# Patient Record
Sex: Male | Born: 1945
Health system: Southern US, Community
[De-identification: ages and names within clinical notes are randomized; demographics above are authoritative.]

## PROBLEM LIST (undated history)

## (undated) DIAGNOSIS — I2510 Atherosclerotic heart disease of native coronary artery without angina pectoris: Secondary | ICD-10-CM

## (undated) DIAGNOSIS — I10 Essential (primary) hypertension: Secondary | ICD-10-CM

## (undated) DIAGNOSIS — R569 Unspecified convulsions: Secondary | ICD-10-CM

## (undated) DIAGNOSIS — E785 Hyperlipidemia, unspecified: Secondary | ICD-10-CM

## (undated) DIAGNOSIS — G40909 Epilepsy, unspecified, not intractable, without status epilepticus: Secondary | ICD-10-CM

## (undated) DIAGNOSIS — Z9289 Personal history of other medical treatment: Secondary | ICD-10-CM

## (undated) DIAGNOSIS — M1990 Unspecified osteoarthritis, unspecified site: Secondary | ICD-10-CM

## (undated) DIAGNOSIS — Z9861 Coronary angioplasty status: Principal | ICD-10-CM

## (undated) DIAGNOSIS — E669 Obesity, unspecified: Secondary | ICD-10-CM

## (undated) DIAGNOSIS — R0600 Dyspnea, unspecified: Secondary | ICD-10-CM

## (undated) HISTORY — DX: Epilepsy, unspecified, not intractable, without status epilepticus: G40.909

## (undated) HISTORY — DX: Obesity, unspecified: E66.9

## (undated) HISTORY — DX: Atherosclerotic heart disease of native coronary artery without angina pectoris: I25.10

## (undated) HISTORY — DX: Coronary angioplasty status: Z98.61

## (undated) HISTORY — DX: Unspecified convulsions: R56.9

## (undated) HISTORY — DX: Essential (primary) hypertension: I10

## (undated) HISTORY — DX: Hyperlipidemia, unspecified: E78.5

## (undated) HISTORY — DX: Personal history of other medical treatment: Z92.89

---

## 1997-01-25 DIAGNOSIS — I2510 Atherosclerotic heart disease of native coronary artery without angina pectoris: Secondary | ICD-10-CM

## 1997-01-25 HISTORY — DX: Atherosclerotic heart disease of native coronary artery without angina pectoris: I25.10

## 1998-01-13 ENCOUNTER — Encounter: Payer: Self-pay | Admitting: Emergency Medicine

## 1998-01-13 ENCOUNTER — Emergency Department (HOSPITAL_COMMUNITY): Admission: EM | Admit: 1998-01-13 | Discharge: 1998-01-13 | Payer: Self-pay | Admitting: Emergency Medicine

## 1998-01-15 ENCOUNTER — Observation Stay (HOSPITAL_COMMUNITY): Admission: AD | Admit: 1998-01-15 | Discharge: 1998-01-16 | Payer: Self-pay | Admitting: Cardiology

## 1998-01-20 HISTORY — PX: CORONARY ANGIOPLASTY: SHX604

## 1998-01-20 HISTORY — PX: CARDIAC CATHETERIZATION: SHX172

## 1998-08-11 ENCOUNTER — Ambulatory Visit (HOSPITAL_COMMUNITY): Admission: RE | Admit: 1998-08-11 | Discharge: 1998-08-11 | Payer: Self-pay | Admitting: Cardiology

## 1998-08-12 ENCOUNTER — Encounter: Payer: Self-pay | Admitting: Cardiology

## 2000-07-12 ENCOUNTER — Encounter: Payer: Self-pay | Admitting: Internal Medicine

## 2004-02-06 ENCOUNTER — Ambulatory Visit: Payer: Self-pay | Admitting: Internal Medicine

## 2004-08-19 ENCOUNTER — Ambulatory Visit: Payer: Self-pay | Admitting: Internal Medicine

## 2004-09-04 ENCOUNTER — Ambulatory Visit: Payer: Self-pay | Admitting: Gastroenterology

## 2004-09-16 ENCOUNTER — Encounter (INDEPENDENT_AMBULATORY_CARE_PROVIDER_SITE_OTHER): Payer: Self-pay | Admitting: *Deleted

## 2004-09-16 ENCOUNTER — Ambulatory Visit: Payer: Self-pay | Admitting: Gastroenterology

## 2004-09-21 ENCOUNTER — Ambulatory Visit: Payer: Self-pay | Admitting: Internal Medicine

## 2004-10-14 ENCOUNTER — Ambulatory Visit: Payer: Self-pay | Admitting: Internal Medicine

## 2004-10-27 ENCOUNTER — Ambulatory Visit: Payer: Self-pay | Admitting: Gastroenterology

## 2005-01-05 ENCOUNTER — Ambulatory Visit: Payer: Self-pay | Admitting: Gastroenterology

## 2005-02-18 ENCOUNTER — Ambulatory Visit: Payer: Self-pay | Admitting: Internal Medicine

## 2005-07-29 ENCOUNTER — Ambulatory Visit: Payer: Self-pay | Admitting: Family Medicine

## 2005-10-14 ENCOUNTER — Ambulatory Visit: Payer: Self-pay | Admitting: Internal Medicine

## 2006-05-04 ENCOUNTER — Ambulatory Visit: Payer: Self-pay | Admitting: Internal Medicine

## 2006-05-04 LAB — CONVERTED CEMR LAB
ALT: 28 units/L (ref 0–40)
Albumin: 3.8 g/dL (ref 3.5–5.2)
BUN: 23 mg/dL (ref 6–23)
CO2: 31 meq/L (ref 19–32)
Calcium: 9.8 mg/dL (ref 8.4–10.5)
Chloride: 107 meq/L (ref 96–112)
Cholesterol: 184 mg/dL (ref 0–200)
Creatinine, Ser: 1 mg/dL (ref 0.4–1.5)
GFR calc Af Amer: 98 mL/min
GFR calc non Af Amer: 81 mL/min
Glucose, Bld: 92 mg/dL (ref 70–99)
HDL: 43 mg/dL (ref >39.0)
LDL Cholesterol: 114 mg/dL — ABNORMAL HIGH (ref 0–99)
Phosphorus: 4.1 mg/dL (ref 2.3–4.6)
Potassium: 4.6 meq/L (ref 3.5–5.1)
Sodium: 141 meq/L (ref 135–145)
Total CHOL/HDL Ratio: 4.3
Triglycerides: 137 mg/dL (ref 0–149)
VLDL: 27 mg/dL (ref 0–40)

## 2006-07-05 ENCOUNTER — Encounter: Payer: Self-pay | Admitting: Internal Medicine

## 2006-10-13 DIAGNOSIS — G40909 Epilepsy, unspecified, not intractable, without status epilepticus: Secondary | ICD-10-CM | POA: Insufficient documentation

## 2006-10-13 DIAGNOSIS — R569 Unspecified convulsions: Secondary | ICD-10-CM

## 2006-12-02 ENCOUNTER — Telehealth (INDEPENDENT_AMBULATORY_CARE_PROVIDER_SITE_OTHER): Payer: Self-pay | Admitting: *Deleted

## 2007-01-28 ENCOUNTER — Telehealth: Payer: Self-pay | Admitting: Family Medicine

## 2007-03-06 ENCOUNTER — Ambulatory Visit: Payer: Self-pay | Admitting: Family Medicine

## 2007-03-14 ENCOUNTER — Telehealth: Payer: Self-pay | Admitting: Family Medicine

## 2008-01-01 ENCOUNTER — Telehealth: Payer: Self-pay | Admitting: Internal Medicine

## 2008-03-07 ENCOUNTER — Ambulatory Visit: Payer: Self-pay | Admitting: Internal Medicine

## 2008-03-08 LAB — CONVERTED CEMR LAB
ALT: 23 units/L (ref 0–53)
AST: 25 units/L (ref 0–37)
Albumin: 4.5 g/dL (ref 3.5–5.2)
Albumin: 4.7 g/dL (ref 3.5–5.2)
Alkaline Phosphatase: 63 units/L (ref 39–117)
BUN: 38 mg/dL — ABNORMAL HIGH (ref 6–23)
Basophils Absolute: 0 10*3/uL (ref 0.0–0.1)
Basophils Relative: 0 % (ref 0–1)
Bilirubin, Direct: <0.1 mg/dL (ref 0.0–0.3)
CO2: 19 meq/L (ref 19–32)
Calcium: 9.6 mg/dL (ref 8.4–10.5)
Chloride: 105 meq/L (ref 96–112)
Cholesterol: 190 mg/dL (ref 0–200)
Creatinine, Ser: 1.14 mg/dL (ref 0.40–1.50)
Eosinophils Absolute: 0.1 10*3/uL (ref 0.0–0.7)
Eosinophils Relative: 2 % (ref 0–5)
Glucose, Bld: 95 mg/dL (ref 70–99)
HCT: 39.6 % (ref 39.0–52.0)
HDL: 49 mg/dL (ref >39)
Hemoglobin: 13.2 g/dL (ref 13.0–17.0)
LDL Cholesterol: 117 mg/dL — ABNORMAL HIGH (ref 0–99)
Lymphocytes Relative: 23 % (ref 12–46)
Lymphs Abs: 0.9 10*3/uL (ref 0.7–4.0)
MCHC: 33.3 g/dL (ref 30.0–36.0)
MCV: 89.6 fL (ref 78.0–100.0)
Monocytes Absolute: 0.4 10*3/uL (ref 0.1–1.0)
Monocytes Relative: 9 % (ref 3–12)
Neutro Abs: 2.7 10*3/uL (ref 1.7–7.7)
Neutrophils Relative %: 67 % (ref 43–77)
PSA: 0.94 ng/mL (ref 0.10–4.00)
Phosphorus: 3.6 mg/dL (ref 2.3–4.6)
Platelets: 198 10*3/uL (ref 150–400)
Potassium: 4.7 meq/L (ref 3.5–5.3)
RBC: 4.42 M/uL (ref 4.22–5.81)
RDW: 12.6 % (ref 11.5–15.5)
Sodium: 139 meq/L (ref 135–145)
TSH: 1.147 microintl units/mL (ref 0.350–4.50)
Total Bilirubin: 0.3 mg/dL (ref 0.3–1.2)
Total CHOL/HDL Ratio: 3.9
Total Protein: 7.2 g/dL (ref 6.0–8.3)
Triglycerides: 122 mg/dL (ref <150)
VLDL: 24 mg/dL (ref 0–40)
Valproic Acid Lvl: 28.6 ug/mL — ABNORMAL LOW (ref 50.0–100.0)
WBC: 4.1 10*3/uL (ref 4.0–10.5)

## 2008-05-10 ENCOUNTER — Telehealth: Payer: Self-pay | Admitting: Internal Medicine

## 2008-08-09 ENCOUNTER — Encounter: Payer: Self-pay | Admitting: Internal Medicine

## 2008-10-25 ENCOUNTER — Encounter: Payer: Self-pay | Admitting: Internal Medicine

## 2008-12-05 ENCOUNTER — Encounter: Payer: Self-pay | Admitting: Internal Medicine

## 2009-05-15 ENCOUNTER — Telehealth: Payer: Self-pay | Admitting: Internal Medicine

## 2009-06-04 ENCOUNTER — Encounter: Payer: Self-pay | Admitting: Family Medicine

## 2009-06-06 ENCOUNTER — Telehealth: Payer: Self-pay | Admitting: Internal Medicine

## 2009-07-01 ENCOUNTER — Telehealth (INDEPENDENT_AMBULATORY_CARE_PROVIDER_SITE_OTHER): Payer: Self-pay | Admitting: *Deleted

## 2009-07-01 ENCOUNTER — Ambulatory Visit: Payer: Self-pay | Admitting: Family Medicine

## 2009-07-01 LAB — CONVERTED CEMR LAB
ALT: 20 units/L (ref 0–53)
AST: 19 units/L (ref 0–37)
Albumin: 3.9 g/dL (ref 3.5–5.2)
Alkaline Phosphatase: 56 units/L (ref 39–117)
BUN: 29 mg/dL — ABNORMAL HIGH (ref 6–23)
Bilirubin, Direct: 0.1 mg/dL (ref 0.0–0.3)
CO2: 28 meq/L (ref 19–32)
Calcium: 9.4 mg/dL (ref 8.4–10.5)
Chloride: 109 meq/L (ref 96–112)
Cholesterol: 186 mg/dL (ref 0–200)
Creatinine, Ser: 1.1 mg/dL (ref 0.4–1.5)
GFR calc non Af Amer: 74.29 mL/min (ref >60)
Glucose, Bld: 100 mg/dL — ABNORMAL HIGH (ref 70–99)
HDL: 44.8 mg/dL (ref >39.00)
LDL Cholesterol: 112 mg/dL — ABNORMAL HIGH (ref 0–99)
PSA: 0.36 ng/mL (ref 0.10–4.00)
Potassium: 5.3 meq/L — ABNORMAL HIGH (ref 3.5–5.1)
Sodium: 142 meq/L (ref 135–145)
Total Bilirubin: 0.6 mg/dL (ref 0.3–1.2)
Total CHOL/HDL Ratio: 4
Total Protein: 6.2 g/dL (ref 6.0–8.3)
Triglycerides: 144 mg/dL (ref 0.0–149.0)
VLDL: 28.8 mg/dL (ref 0.0–40.0)

## 2009-07-07 ENCOUNTER — Ambulatory Visit: Payer: Self-pay | Admitting: Family Medicine

## 2009-07-07 DIAGNOSIS — R1011 Right upper quadrant pain: Secondary | ICD-10-CM | POA: Insufficient documentation

## 2009-07-07 DIAGNOSIS — N509 Disorder of male genital organs, unspecified: Secondary | ICD-10-CM | POA: Insufficient documentation

## 2009-07-07 DIAGNOSIS — K5900 Constipation, unspecified: Secondary | ICD-10-CM | POA: Insufficient documentation

## 2009-07-07 DIAGNOSIS — R1032 Left lower quadrant pain: Secondary | ICD-10-CM | POA: Insufficient documentation

## 2009-07-07 LAB — CONVERTED CEMR LAB
Bilirubin Urine: NEGATIVE
Blood in Urine, dipstick: NEGATIVE
Cholesterol, target level: 200 mg/dL
Glucose, Urine, Semiquant: NEGATIVE
HDL goal, serum: 40 mg/dL
Ketones, urine, test strip: NEGATIVE
LDL Goal: 100 mg/dL
Nitrite: NEGATIVE
Protein, U semiquant: NEGATIVE
Specific Gravity, Urine: >=1.03
Urobilinogen, UA: 0.2
WBC Urine, dipstick: NEGATIVE
pH: 6

## 2009-07-08 LAB — CONVERTED CEMR LAB
BUN: 34 mg/dL — ABNORMAL HIGH (ref 6–23)
Basophils Absolute: 0 10*3/uL (ref 0.0–0.1)
Basophils Relative: 1 % (ref 0–1)
CO2: 20 meq/L (ref 19–32)
Calcium: 9.8 mg/dL (ref 8.4–10.5)
Chloride: 106 meq/L (ref 96–112)
Creatinine, Ser: 1.23 mg/dL (ref 0.40–1.50)
Eosinophils Absolute: 0.1 10*3/uL (ref 0.0–0.7)
Eosinophils Relative: 1 % (ref 0–5)
Glucose, Bld: 93 mg/dL (ref 70–99)
HCT: 37.2 % — ABNORMAL LOW (ref 39.0–52.0)
Hemoglobin: 12 g/dL — ABNORMAL LOW (ref 13.0–17.0)
Lymphocytes Relative: 38 % (ref 12–46)
Lymphs Abs: 2.2 10*3/uL (ref 0.7–4.0)
MCHC: 32.3 g/dL (ref 30.0–36.0)
MCV: 93.2 fL (ref 78.0–100.0)
Monocytes Absolute: 0.5 10*3/uL (ref 0.1–1.0)
Monocytes Relative: 8 % (ref 3–12)
Neutro Abs: 3.1 10*3/uL (ref 1.7–7.7)
Neutrophils Relative %: 53 % (ref 43–77)
Platelets: 204 10*3/uL (ref 150–400)
Potassium: 5.2 meq/L (ref 3.5–5.3)
RBC: 3.99 M/uL — ABNORMAL LOW (ref 4.22–5.81)
RDW: 13.6 % (ref 11.5–15.5)
Sodium: 141 meq/L (ref 135–145)
TSH: 1.727 microintl units/mL (ref 0.350–4.500)
Vit D, 25-Hydroxy: 51 ng/mL (ref 30–89)
Vitamin B-12: 536 pg/mL (ref 211–911)
WBC: 5.8 10*3/uL (ref 4.0–10.5)

## 2009-07-23 ENCOUNTER — Ambulatory Visit: Payer: Self-pay | Admitting: Family Medicine

## 2009-07-23 LAB — CONVERTED CEMR LAB: OCCULT 1: NEGATIVE

## 2009-07-29 ENCOUNTER — Ambulatory Visit: Payer: Self-pay | Admitting: Family Medicine

## 2009-07-29 DIAGNOSIS — D638 Anemia in other chronic diseases classified elsewhere: Secondary | ICD-10-CM | POA: Insufficient documentation

## 2009-07-30 DIAGNOSIS — E291 Testicular hypofunction: Secondary | ICD-10-CM

## 2009-07-30 DIAGNOSIS — R7989 Other specified abnormal findings of blood chemistry: Secondary | ICD-10-CM | POA: Insufficient documentation

## 2009-07-30 LAB — CONVERTED CEMR LAB
Ferritin: 203.8 ng/mL (ref 22.0–322.0)
Iron: 78 ug/dL (ref 42–165)

## 2009-07-31 LAB — CONVERTED CEMR LAB
Sex Hormone Binding: 49 nmol/L (ref 13–71)
Testosterone Free: 33.7 pg/mL — ABNORMAL LOW (ref 47.0–244.0)
Testosterone-% Free: 1.5 % — ABNORMAL LOW (ref 1.6–2.9)
Testosterone: 227.24 ng/dL — ABNORMAL LOW (ref 350–890)

## 2009-08-01 ENCOUNTER — Ambulatory Visit: Payer: Self-pay | Admitting: Family Medicine

## 2009-08-05 LAB — CONVERTED CEMR LAB
FSH: 3.5 milliintl units/mL (ref 1.4–18.1)
Prolactin: 5.8 ng/mL (ref 2.1–17.1)

## 2009-08-06 ENCOUNTER — Ambulatory Visit: Payer: Self-pay | Admitting: Cardiology

## 2009-08-12 ENCOUNTER — Telehealth: Payer: Self-pay | Admitting: Family Medicine

## 2009-08-25 ENCOUNTER — Telehealth (INDEPENDENT_AMBULATORY_CARE_PROVIDER_SITE_OTHER): Payer: Self-pay | Admitting: *Deleted

## 2009-09-08 ENCOUNTER — Encounter: Payer: Self-pay | Admitting: Family Medicine

## 2009-09-12 ENCOUNTER — Telehealth: Payer: Self-pay | Admitting: Family Medicine

## 2009-10-21 ENCOUNTER — Ambulatory Visit: Payer: Self-pay | Admitting: Family Medicine

## 2009-12-26 ENCOUNTER — Encounter: Payer: Self-pay | Admitting: Family Medicine

## 2010-01-12 ENCOUNTER — Encounter (INDEPENDENT_AMBULATORY_CARE_PROVIDER_SITE_OTHER): Payer: Self-pay | Admitting: *Deleted

## 2010-02-26 NOTE — Progress Notes (Signed)
----   Converted from flag ---- ---- 07/01/2009 8:29 AM, Kerby Nora MD wrote: CMET, lipids, PSA Dx 272.0, v76.44  ---- 06/30/2009 11:54 AM, Mills Koller wrote: cpx labs for tomorrow, please ------------------------------

## 2010-02-26 NOTE — Progress Notes (Signed)
  Phone Note Other Incoming   Caller: patient Summary of Call: At wife appt today he and dr b discussed up his lactolose and dr b went up to 2 times a day.Consuello Masse CMA   Initial call taken by: Benny Lennert CMA (AAMA),  August 25, 2009 10:28 AM    New/Updated Medications: LACTULOSE  POWD (LACTULOSE) 15 ml by mouth 2 times daily, hold if diarrhea

## 2010-02-26 NOTE — Miscellaneous (Signed)
  Medications Added CRESTOR 5 MG TABS (ROSUVASTATIN CALCIUM) one table ttwice weekly       Clinical Lists Changes  Medications: Changed medication from CRESTOR 5 MG TABS (ROSUVASTATIN CALCIUM) one tablet once weekly to CRESTOR 5 MG TABS (ROSUVASTATIN CALCIUM) one table ttwice weekly     Prior Medications: LISINOPRIL 20 MG  TABS (LISINOPRIL) Take 1 tablet by mouth once a day DEPAKOTE 250 MG TBEC (DIVALPROEX SODIUM) Take one by mouth two times a day ZETIA 10 MG  TABS (EZETIMIBE) Take one by mouth once a day MULTIVITAMINS   TABS (MULTIPLE VITAMIN) Take one by mouth once a day ADULT ASPIRIN EC LOW STRENGTH 81 MG  TBEC (ASPIRIN) Take 1 tablet by mouth once a day FISH OIL 1200 MG CAPS (OMEGA-3 FATTY ACIDS) take one tablet twice daily AMITIZA 8 MCG CAPS (LUBIPROSTONE)  Current Allergies: * PRAVACHOL * CRESTOR

## 2010-02-26 NOTE — Progress Notes (Signed)
Summary: wants to change docs  Phone Note Call from Patient Call back at Work Phone [phone]   Caller: Patient Summary of Call: Pt is asking if he can change from Dr. Alphonsus Sias to Dr. Ermalene Searing. Nothing personal.  If ok he needs to schedule a physical with Dr. Ermalene Searing. Initial call taken by: Lowella Petties CMA,  May 15, 2009 11:06 AM  Follow-up for Phone Call        okay with me Follow-up by: Cindee Salt MD,  May 15, 2009 11:09 AM  Additional Follow-up for Phone Call Additional follow up Details #1::        Okay with me..make it a 45 min CPX so I have enough time. Thank you. Additional Follow-up by: Kerby Nora MD,  May 17, 2009 10:47 PM    Additional Follow-up for Phone Call Additional follow up Details #2::    Pt is being advised by jamie.  Lowella Petties CMA  May 22, 2009 11:58 AM

## 2010-02-26 NOTE — Progress Notes (Signed)
Summary: wants phone call   Phone Note Call from Patient Call back at Work Phone [phone]   Summary of Call: Patient is asking for a phone call from [facility] regarding his urology appt.  Initial call taken by: Melody Comas,  September 12, 2009 10:37 AM  Follow-up for Phone Call        Spoke with patient and he would like to speak to dr Ermalene Searing about the urology appt Follow-up by: Benny Lennert CMA Duncan Dull),  September 12, 2009 3:09 PM  Additional Follow-up for Phone Call Additional follow up Details #1::        Reviewed Urology note.  Pt. still concerned about rectal pressure, low abdominal pain.  Pt call to make appt for anoscopy in office to look into issue further/rule out hemmorhoids. I discussed with pt that I think that his issues may be a chronic pelvic pain syndrome as Dr. Isabel Caprice mentioned. No real treatment for this.   Additional Follow-up by: Kerby Nora MD,  September 12, 2009 5:28 PM

## 2010-02-26 NOTE — Consult Note (Signed)
Summary: [facility]  [facility]   Imported By: Maryln Gottron 09/12/2009 13:51:47  _____________________________________________________________________  External Attachment:    Type:   Image     Comment:   External Document

## 2010-02-26 NOTE — Assessment & Plan Note (Signed)
Summary: CPX FOR 45 MIN SLOT PREVIOUS LETVAK PT/PER BEDSOLE AND LETVAK   Vital Signs:  Patient profile:   65 year old male Height:      72 inches Weight:      271.8 pounds BMI:     37.00 Temp:     97.9 degrees F oral Pulse rate:   60 / minute Pulse rhythm:   regular BP sitting:   118 / 72  (left arm) Cuff size:   large  Vitals Entered By: Benny Lennert CMA Duncan Dull) (July 07, 2009 11:54 AM)  History of Present Illness: Chief complaint cpx  CAD stable...per Dr. Clarene Duke in 05/2009 Started on crestor 5 mg once a week and fish oil 1200 two times a day. Continued on zetia.   Seizure free for 10 years on depakote.   Ever since colonoscopy in 2006...has had perineal pain.Marland Kitchennotes more pain sitting. Saw MD at [facility]..no antibiotics given. Given flowmax for nocturia.  No dysuria, no hematuria, urine flow good, empties out completely.  He has multiple issues to mention..chronic and acute:   Left mid back pain after eating...last 30 min...not sure if with particular foods. modearate to severe but only occ.   Left abdominal pain, constant..for many years..saw I about it..no labs/tests done..recommended if increase to consider CT.  2-3/10. When bladder full pan is worse..more pressure. Significant episodic fatiguet 1 month ago ..feels mainly  Intermittant nausea with water .  Constipation ..firm stool daily. Takes senokot S daily.  Leg cramps..taking potassium leg cramp med..this is why potasssium elevated. Also drinking a lot of gatorade.   Hypertension History:      He denies headache, chest pain, palpitations, dyspnea with exertion, peripheral edema, and syncope.  He notes the following problems with antihypertensive medication side effects: Well controlled on current meds. .        Positive major cardiovascular risk factors include male age 77 years old or older, hyperlipidemia, and hypertension.  Negative major cardiovascular risk factors include non-tobacco-user status.    Positive history for target organ damage include ASHD (either angina/prior MI/prior CABG).    Lipid Management History:      Positive NCEP/ATP III risk factors include male age 21 years old or older, hypertension, and ASHD (either angina/prior MI/prior CABG).  Negative NCEP/ATP III risk factors include non-tobacco-user status.        He notes side effects from his lipid-lowering medication.  Comments include: leg cramps when up on feet a lot. .  The patient denies any symptoms to suggest myopathy or liver disease.      Preventive Screening-Counseling & Management  Caffeine-Diet-Exercise     Does Patient Exercise: no      Drug Use:  no.    Problems Prior to Update: 1)  ? of Chronic Prostatitis  (ICD-601.1) 2)  Constipation  (ICD-564.00) 3)  Fatigue  (ICD-780.79) 4)  Unspecified Disorder of Male Genital Organs  (ICD-608.9) 5)  Abdominal Pain, Left Lower Quadrant  (ICD-789.04) 6)  Ruq Pain  (ICD-789.01) 7)  Hyperkalemia  (ICD-276.7) 8)  Special Screening Malignant Neoplasm of Prostate  (ICD-V76.44) 9)  Preventive Health Care  (ICD-V70.0) 10)  Seizure Disorder  (ICD-780.39) 11)  Hypertension  (ICD-401.9) 12)  Hyperlipidemia  (ICD-272.4) 13)  Coronary Artery Disease  (ICD-414.00)  Current Medications (verified): 1)  Lisinopril 20 Mg  Tabs (Lisinopril) .... Take 1 Tablet By Mouth Once A Day 2)  Depakote 250 Mg Tbec (Divalproex Sodium) .... Take One By Mouth Two Times A Day 3)  Zetia 10  Mg  Tabs (Ezetimibe) .... Take One By Mouth Once A Day 4)  Multivitamins   Tabs (Multiple Vitamin) .... Take One By Mouth Once A Day 5)  Adult Aspirin Ec Low Strength 81 Mg  Tbec (Aspirin) .... Take 1 Tablet By Mouth Once A Day 6)  Stool Softener 100 Mg  Caps (Docusate Sodium) .... Take 1 Capsule By Mouth Once A Day 7)  Crestor 5 Mg Tabs (Rosuvastatin Calcium) .... One Tablet Once Weekly 8)  Fish Oil 1200 Mg Caps (Omega-3 Fatty Acids) .... Take One Tablet Twice Daily  Allergies: 1)  * Pravachol 2)   * Crestor  Past History:  Past medical, surgical, family and social histories (including risk factors) reviewed, and no changes noted (except as noted below).  Past Medical History: Reviewed history from 10/13/2006 and no changes required. Coronary artery disease Hyperlipidemia Hypertension Seizure disorder  [facility] Dr Clarene Duke  [phone] Dr Christella Hartigan  Past Surgical History: Stent LAD (Dr. Clarene Duke) 12/99 Cardiolite negative ,EF 57% 01/05 Stress myoview- EF 61%, negative ischemia 03/08 10/2008 stress test no ischemia, nml EF.  Family History: Reviewed history from 03/07/2008 and no changes required. Dad died of lung cancer, also had prostate cancer age 65s Mom with HTN Only child  Social History: Reviewed history from 03/07/2008 and no changes required. Marital Status: Married Children: 2, healthy Occupation: Owns  Retail banker runs now, retired Never Smoked Alcohol use-no Drug use-no Regular exercise-no, limited walked. Diet: fruits and veggies, lean meats  Drug Use:  no Does Patient Exercise:  no  Review of Systems General:  Complains of fatigue; denies fever and loss of appetite. CV:  Denies chest pain or discomfort. Resp:  Denies shortness of breath. GI:  Complains of abdominal pain; denies bloody stools, diarrhea, gas, and vomiting. GU:  Denies dysuria.  Physical Exam  General:  overweight male in NAD Head:  Normocephalic and atraumatic without obvious abnormalities. Eyes:  No corneal or conjunctival inflammation noted. EOMI. Perrla. Funduscopic exam benign, without hemorrhages, exudates or papilledema. Vision grossly normal. Ears:  External ear exam shows no significant lesions or deformities.  Otoscopic examination reveals clear canals, tympanic membranes are intact bilaterally without bulging, retraction, inflammation or discharge. Hearing is grossly normal bilaterally. Nose:  External nasal examination shows no deformity or inflammation. Nasal  mucosa are pink and moist without lesions or exudates. Mouth:  Oral mucosa and oropharynx without lesions or exudates.  Teeth in good repair. Neck:  no carotid bruit or thyromegaly no cervical or supraclavicular lymphadenopathy  Abdomen:  mild ttp llq, no ruq tenderness Rectal:  No external abnormalities noted. Normal sphincter tone. No rectal masses or tenderness. Genitalia:  Testes bilaterally descended without nodularity, tenderness or masses. No scrotal masses or lesions. No penis lesions or urethral discharge. Prostate:  no gland enlargement and boggy.  Perineum mildly tender. Most tenderness per pt was felt with palpation of prostate Msk:  No deformity or scoliosis noted of thoracic or lumbar spine.   Pulses:  R and L posterior tibial pulses are full and equal bilaterally  Extremities:  no edema Neurologic:  No cranial nerve deficits noted. Station and gait are normal. Plantar reflexes are down-going bilaterally. DTRs are symmetrical throughout. Sensory, motor and coordinative functions appear intact. Skin:  Intact without suspicious lesions or rashes Psych:  Cognition and judgment appear intact. Alert and cooperative with normal attention span and concentration. No apparent delusions, illusions, hallucinations   Impression & Recommendations:  Problem # 1:  Preventive Health Care (ICD-V70.0) The patient's preventative  maintenance and recommended screening tests for an annual wellness exam were reviewed in full today. Brought up to date unless services declined.  Counselled on the importance of diet, exercise, and its role in overall health and mortality. The patient's FH and SH was reviewed, including their home life, tobacco status, and drug and alcohol status.     Problem # 2:  FATIGUE (ICD-780.79)  Orders: T-Vitamin D 25-Hydroxy & 1,25 Dihydroxy (6578) Specimen Handling (46962) T-Basic Metabolic Panel [phone]) T-CBC w/Diff [phone]) T-TSH [phone]) T-Vitamin B12  ([id number])  Problem # 3:  ABDOMINAL PAIN, LEFT LOWER QUADRANT (ICD-789.04) Unclear etiology. Possibley connected to perineal pain. Per pt no connection between RUQ pain after meals.  Orders: Specimen Handling (29518) T-Basic Metabolic Panel [phone]) T-CBC w/Diff [phone]) T-TSH [id number]) T-Vitamin B12 ([id number])  Problem # 4:  CHRONIC PROSTATITIS (ICD-601.1) Most likely the cause of the perineal pain. WIll trat with extended course of antibitoics. If no improvement will refer to urology.   Problem # 5:  RUQ PAIN (ICD-789.01) Likely sure to gallbladder pathology bu minimally problemativ for pt. EWIll eval with [place] once pts higher concerns are evaluated.   Problem # 6:  CONSTIPATION (ICD-564.00) Recommended increaseing fiber, water..info given on diet changes. Use miralax for constipation. If no improvement can consider amitiza.  Consitipation may be contributing to LLQ pain.  The following medications were removed from the medication list:    Stool Softener 100 Mg Caps (Docusate sodium) .Marland Kitchen... Take 1 capsule by mouth once a day His updated medication list for this problem includes:    Miralax Powd (Polyethylene glycol 3350) .Marland KitchenMarland KitchenMarland KitchenMarland Kitchen 17 gm daily as needed constipation.  Problem # 7:  HYPERLIPIDEMIA (ICD-272.4) Inadequate control. Pt will work on [employer]. If not improved control...consider med change, but pt hesitant of myalgia SE in past to statin meds at higher doses.  His updated medication list for this problem includes:    Zetia 10 Mg Tabs (Ezetimibe) .Marland Kitchen... Take one by mouth once a day    Crestor 5 Mg Tabs (Rosuvastatin calcium) ..... One tablet once weekly  Labs Reviewed: SGOT: 19 (07/01/2009)   SGPT: 20 (07/01/2009)  Lipid Goals: Chol Goal: 200 (07/07/2009)   HDL Goal: 40 (07/07/2009)   LDL Goal: 100 (07/07/2009)   TG Goal: 150 (07/07/2009)  10 Yr Risk Heart Disease: N/A   HDL:44.80 (07/01/2009), 49 (03/07/2008)  LDL:112 (07/01/2009), 117 (03/07/2008)   Chol:186 (07/01/2009), 190 (03/07/2008)  Trig:144.0 (07/01/2009), 122 (03/07/2008)  Problem # 8:  HYPERTENSION (ICD-401.9) Well controlled. Continue current medication.  His updated medication list for this problem includes:    Lisinopril 20 Mg Tabs (Lisinopril) .Marland Kitchen... Take 1 tablet by mouth once a day  BP today: 118/72 Prior BP: 148/80 (03/07/2008)  10 Yr Risk Heart Disease: N/A  Labs Reviewed: K+: 5.3 (07/01/2009) Creat: : 1.1 (07/01/2009)   Chol: 186 (07/01/2009)   HDL: 44.80 (07/01/2009)   LDL: 112 (07/01/2009)   TG: 144.0 (07/01/2009)  Complete Medication List: 1)  Lisinopril 20 Mg Tabs (Lisinopril) .... Take 1 tablet by mouth once a day 2)  Depakote 250 Mg Tbec (Divalproex sodium) .... Take one by mouth two times a day 3)  Zetia 10 Mg Tabs (Ezetimibe) .... Take one by mouth once a day 4)  Multivitamins Tabs (Multiple vitamin) .... Take one by mouth once a day 5)  Adult Aspirin Ec Low Strength 81 Mg Tbec (Aspirin) .... Take 1 tablet by mouth once a day 6)  Crestor 5 Mg Tabs (Rosuvastatin calcium) .... One tablet once  weekly 7)  Fish Oil 1200 Mg Caps (Omega-3 fatty acids) .... Take one tablet twice daily 8)  Miralax Powd (Polyethylene glycol 3350) .Marland KitchenMarland Kitchen. 17 gm daily as needed constipation. 9)  Ciprofloxacin Hcl 500 Mg Tabs (Ciprofloxacin hcl) .Marland Kitchen.. 1 tab by mouth two times a day x 14 days  Other Orders: UA Dipstick W/ Micro (manual) (13086)  Hypertension Assessment/Plan:      The patient's hypertensive risk group is category C: Target organ damage and/or diabetes.  Today's blood pressure is 118/72.  His blood pressure goal is < 140/90.  Lipid Assessment/Plan:      Based on NCEP/ATP III, the patient's risk factor category is "history of coronary disease, peripheral vascular disease, cerebrovascular disease, or aortic aneurysm".  The patient's lipid goals are as follows: Total cholesterol goal is 200; LDL cholesterol goal is 100; HDL cholesterol goal is 40; Triglyceride goal is 150.   His LDL cholesterol goal has not been met.    Patient Instructions: 1)  Increase fiber in diet, increase water to 64 ounces daily.  2)  Stop potassium supplements. 3)  Stop gatorade.  4)  Stop senokot ..change to miralax daily to as needed for constipation. 5)  Start antibiotics for presumed prostatitis.Marland Kitchencomplete 14 day course.  6)  Please schedule a follow-up appointment in 2-3 weeks 30 min OV.  Prescriptions: LISINOPRIL 20 MG  TABS (LISINOPRIL) Take 1 tablet by mouth once a day  #30 x 11   Entered and Authorized by:   Kerby Nora MD   Signed by:   Kerby Nora MD on 07/07/2009   Method used:   Electronically to        [facility]* (retail)       [address]       [place], [place]  [address]       Ph: [phone]       Fax: [phone]   RxID:   [id number] ZETIA 10 MG  TABS (EZETIMIBE) Take one by mouth once a day  #30 x 11   Entered and Authorized by:   Kerby Nora MD   Signed by:   Kerby Nora MD on 07/07/2009   Method used:   Electronically to        [facility]* (retail)       [address]       [place], [place]  [address]       Ph: [phone]       Fax: [phone]   RxID:   [id number] DEPAKOTE 250 MG TBEC (DIVALPROEX SODIUM) Take one by mouth two times a day  #60 x 11   Entered and Authorized by:   Kerby Nora MD   Signed by:   Kerby Nora MD on 07/07/2009   Method used:   Electronically to        [facility]* (retail)       [address]       [place], [place]  [address]       Ph: [phone]       Fax: [phone]   RxID:   [id number] CIPROFLOXACIN HCL 500 MG TABS (CIPROFLOXACIN HCL) 1 tab by mouth two times a day x 14 days  #28 x 0   Entered and Authorized by:   Kerby Nora MD   Signed by:   Kerby Nora MD on 07/07/2009   Method used:   Electronically to        [facility]* (retail)       [address]  [place], [place]  [address]       Ph: [phone]       Fax: [phone]   RxID:   [id number]   Current Allergies  (reviewed today): * PRAVACHOL * CRESTOR  Flu Vaccine Result Date:  10/25/2008 Flu Vaccine Result:  given Flu Vaccine Next Due:  1 yr Flex Sig Next Due:  Not Indicated Last Hemoccult Result: Negative (09/11/2004 9:22:05 AM) Hemoccult Next Due:  Not Indicated     Past Medical History:    Reviewed history from 10/13/2006 and no changes required:       Coronary artery disease       Hyperlipidemia       Hypertension       Seizure disorder              [facility]       Dr Clarene Duke  [phone]       Dr Christella Hartigan  Past Surgical History:    Reviewed history from 10/13/2006 and no changes required:       Stent LAD (Dr. Clarene Duke) 12/99       Cardiolite negative ,EF 57% 01/05       Stress myoview- EF 61%, negative ischemia 03/08       10/2008 stress test no ischemia, nml EF.     Laboratory Results   Urine Tests  Date/Time Received: July 07, 2009 1:20 PM  Date/Time Reported: July 07, 2009 1:20 PM   Routine Urinalysis   Color: yellow Appearance: Clear Glucose: negative   (Normal Range: Negative) Bilirubin: negative   (Normal Range: Negative) Ketone: negative   (Normal Range: Negative) Spec. Gravity: >=1.030   (Normal Range: 1.003-1.035) Blood: negative   (Normal Range: Negative) pH: 6.0   (Normal Range: 5.0-8.0) Protein: negative   (Normal Range: Negative) Urobilinogen: 0.2   (Normal Range: 0-1) Nitrite: negative   (Normal Range: Negative) Leukocyte Esterace: negative   (Normal Range: Negative)

## 2010-02-26 NOTE — Letter (Signed)
Summary: [facility]  [facility]   Imported By: Lanelle Bal 06/11/2009 12:38:49  _____________________________________________________________________  External Attachment:    Type:   Image     Comment:   External Document

## 2010-02-26 NOTE — Progress Notes (Signed)
Summary: lactulose powder  Phone Note From Pharmacy   Caller: [facility] Call For: Dr. Ermalene Searing  Summary of Call: [facility] called to see if they could switch the Lactulose powder to lactulose liquid. They do not have it in the powder. Please advise. [phone] Initial call taken by: Melody Comas,  August 12, 2009 8:50 AM  Follow-up for Phone Call        Okay to change to liquid.Marland KitchenMarland KitchenI intended liquid.  Follow-up by: Kerby Nora MD,  August 12, 2009 9:00 AM  Additional Follow-up for Phone Call Additional follow up Details #1::        Pharmacist called Additional Follow-up by: Benny Lennert CMA Duncan Dull),  August 12, 2009 9:03 AM

## 2010-02-26 NOTE — Letter (Signed)
Summary: [facility]  [facility]   Imported By: Maryln Gottron 01/09/2010 14:15:01  _____________________________________________________________________  External Attachment:    Type:   Image     Comment:   External Document  Appended Document: [facility] Change crestor dose  as they changed in recs.   Appended Document: [facility] done.Consuello Masse CMA

## 2010-02-26 NOTE — Assessment & Plan Note (Signed)
Summary: ROA FOR FOLLOW-UP/JRR   Vital Signs:  Patient profile:   65 year old male Height:      72 inches Weight:      271.2 pounds BMI:     36.91 Temp:     97.7 degrees F oral Pulse rate:   60 / minute Pulse rhythm:   regular BP sitting:   90 / 60  (left arm) Cuff size:   large  Vitals Entered By: Benny Lennert CMA Duncan Dull) (July 29, 2009 9:14 AM)  History of Present Illness: Chief complaint follow up   Ever since colonoscopy in 2006...has had perineal pain.Marland Kitchennotes more pain sitting. Saw MD at [facility]..no antibiotics given. Given flomax for nocturia.  No dysuria, no hematuria, urine flow good, empties out completely. Left abdominal pain, constant..for many years..saw I about it..no labs/tests done..recommended if increase to consider CT.  2-3/10. When bladder full pain is worse..more pressure. Significant episodic fatiguet 1 month ago ..feels mainly  Intermittant nausea with water . AT LAST OV.Marland KitchenMarland KitchenI felt most likely due to chronic prostatitis.Marland Kitchentreated with CIPRO x 14 days.   Per pt perineal pain is improved, but continued pain in left lower quadrant.   Constipation ..firm stool daily.... since last OV started miralax x 1 week... felt did not work as well as senokot so restarted it. BMs daily..when takes senokat.. no straining or firm stools.   Leg cramps..taking potassium leg cramp med..this is why potasssium elevated. Also drinking a lot of gatorade. ...on recheck potassium was in nml range although high normal.   Left mid back pain after eating...last 30 min...not sure if with particular foods. modearate to severe but only occ.   Nml vit D, nml B12, nml thyroid. Very mild anemia...regular heme cards negative x 3   Problems Prior to Update: 1)  Special Screening Malig Neoplasms Other Sites  (ICD-V76.49) 2)  Ruq Pain  (ICD-789.01) 3)  ? of Chronic Prostatitis  (ICD-601.1) 4)  Constipation  (ICD-564.00) 5)  Fatigue  (ICD-780.79) 6)  Unspecified Disorder of Male Genital  Organs  (ICD-608.9) 7)  Abdominal Pain, Left Lower Quadrant  (ICD-789.04) 8)  Ruq Pain  (ICD-789.01) 9)  Hyperkalemia  (ICD-276.7) 10)  Special Screening Malignant Neoplasm of Prostate  (ICD-V76.44) 11)  Preventive Health Care  (ICD-V70.0) 12)  Seizure Disorder  (ICD-780.39) 13)  Hypertension  (ICD-401.9) 14)  Hyperlipidemia  (ICD-272.4) 15)  Coronary Artery Disease  (ICD-414.00)  Current Medications (verified): 1)  Lisinopril 20 Mg  Tabs (Lisinopril) .... Take 1 Tablet By Mouth Once A Day 2)  Depakote 250 Mg Tbec (Divalproex Sodium) .... Take One By Mouth Two Times A Day 3)  Zetia 10 Mg  Tabs (Ezetimibe) .... Take One By Mouth Once A Day 4)  Multivitamins   Tabs (Multiple Vitamin) .... Take One By Mouth Once A Day 5)  Adult Aspirin Ec Low Strength 81 Mg  Tbec (Aspirin) .... Take 1 Tablet By Mouth Once A Day 6)  Crestor 5 Mg Tabs (Rosuvastatin Calcium) .... One Tablet Once Weekly 7)  Fish Oil 1200 Mg Caps (Omega-3 Fatty Acids) .... Take One Tablet Twice Daily 8)  Miralax  Powd (Polyethylene Glycol 3350) .Marland KitchenMarland KitchenMarland Kitchen 17 Gm Daily As Needed Constipation.  Allergies: 1)  * Pravachol 2)  * Crestor  Past History:  Past medical, surgical, family and social histories (including risk factors) reviewed, and no changes noted (except as noted below).  Past Medical History: Reviewed history from 10/13/2006 and no changes required. Coronary artery disease Hyperlipidemia Hypertension Seizure disorder  CONSULTANTS Dr Clarene Duke  [phone] Dr Christella Hartigan  Past Surgical History: Reviewed history from 07/07/2009 and no changes required. Stent LAD (Dr. Clarene Duke) 12/99 Cardiolite negative ,EF 57% 01/05 Stress myoview- EF 61%, negative ischemia 03/08 10/2008 stress test no ischemia, nml EF.  Family History: Reviewed history from 07/07/2009 and no changes required. Dad died of lung cancer, also had prostate cancer age 67s Mom with HTN Only child  Social History: Reviewed history from 07/07/2009 and no  changes required. Marital Status: Married Children: 2, healthy Occupation: Owns  Retail banker runs now, retired Never Smoked Alcohol use-no Drug use-no Regular exercise-no, limited walked. Diet: fruits and veggies, lean meats  Review of Systems General:  Complains of fatigue; denies fever. CV:  Denies chest pain or discomfort. Resp:  Denies shortness of breath. GI:  Denies bloody stools. GU:  Denies dysuria, urinary frequency, and urinary hesitancy.  Physical Exam  General:  overweight male in NAD Mouth:  Oral mucosa and oropharynx without lesions or exudates.  Teeth in good repair. Neck:  no carotid bruit or thyromegaly no cervical or supraclavicular lymphadenopathy  Lungs:  Normal respiratory effort, chest expands symmetrically. Lungs are clear to auscultation, no crackles or wheezes. Heart:  Normal rate and regular rhythm. S1 and S2 normal without gallop, murmur, click, rub or other extra sounds. Abdomen:  mild ttp llq, no ruq tenderness Pulses:  R and L posterior tibial pulses are full and equal bilaterally  Extremities:  no edema Skin:  Intact without suspicious lesions or rashes   Impression & Recommendations:  Problem # 1:  ABDOMINAL PAIN, LEFT LOWER QUADRANT (ICD-789.04) Unclear cause. Patient  with high level of concern. neg heme cards, neg colonoscopy in 2006...eval with CT abd/pelvis. If negative...symptoms may be due to chronic prostitis..consider referrral to urology. Orders: Radiology Referral (Radiology)  Problem # 2:  CONSTIPATION (ICD-564.00) Stable.  His updated medication list for this problem includes:    Miralax Powd (Polyethylene glycol 3350) .Marland KitchenMarland KitchenMarland KitchenMarland Kitchen 17 gm daily as needed constipation.  Problem # 3:  ? of CHRONIC PROSTATITIS (ICD-601.1)  Problem # 4:  FATIGUE (ICD-780.79) Mild anemia. Will eval testosterone levels.  Orders: T-Testosterone, Free and Total [phone])  Problem # 5:  ANEMIA (ICD-285.9) Normocytic.Marland Kitcheneval for iron  def.  Orders: TLB-IBC Pnl (Iron/FE;Transferrin) (83550-IBC) TLB-Ferritin (82728-FER)  Problem # 6:  RUQ PAIN (ICD-789.01) Possible gallstone disease..will consider per CT scan results. No suggestion of infection.   Complete Medication List: 1)  Lisinopril 20 Mg Tabs (Lisinopril) .... Take 1 tablet by mouth once a day 2)  Depakote 250 Mg Tbec (Divalproex sodium) .... Take one by mouth two times a day 3)  Zetia 10 Mg Tabs (Ezetimibe) .... Take one by mouth once a day 4)  Multivitamins Tabs (Multiple vitamin) .... Take one by mouth once a day 5)  Adult Aspirin Ec Low Strength 81 Mg Tbec (Aspirin) .... Take 1 tablet by mouth once a day 6)  Crestor 5 Mg Tabs (Rosuvastatin calcium) .... One tablet once weekly 7)  Fish Oil 1200 Mg Caps (Omega-3 fatty acids) .... Take one tablet twice daily 8)  Miralax Powd (Polyethylene glycol 3350) .Marland KitchenMarland Kitchen. 17 gm daily as needed constipation.  Patient Instructions: 1)  Referral Appointment Information 2)  Day/Date: 3)  Time: 4)  Place/MD: 5)  Address: 6)  Phone/Fax: 7)  Patient given appointment information. Information/Orders faxed/mailed.   Current Allergies (reviewed today): * PRAVACHOL * CRESTOR

## 2010-02-26 NOTE — Assessment & Plan Note (Signed)
Summary: ROA FOR FOLLOW-UP AND SMALL PROCEDURE IN OFFICE/ PER DR BEDSO...   Vital Signs:  Patient profile:   65 year old male Height:      72 inches Weight:      270.0 pounds BMI:     36.75 Temp:     97.9 degrees F oral Pulse rate:   60 / minute Pulse rhythm:   regular BP sitting:   110 / 60  (left arm) Cuff size:   large  Vitals Entered By: Benny Lennert CMA Duncan Dull) (October 21, 2009 11:44 AM)  History of Present Illness: Chief complaint small procedure in office  Ever since colonoscopy in 2006...has had perineal pain.Marland Kitchennotes more pain sitting, continues to fluctuate in intensity through the month. Saw MD at [facility]..no antibiotics given. Given flomax for nocturia.  No dysuria, no hematuria, urine flow good, empties out completely.  Left abdominal pain, constant..for many years..saw GI about it..no labs/tests done..recommended if increase to consider CT.  2-3/10. When bladder full pain is worse..more pressure. Significant episodic fatigue several months ago. Intermittant nausea with water .  Several months ago.Marland KitchenMarland KitchenI felt most likely due to chronic prostatitis.Marland Kitchentreated with CIPRO x 14 days.  Per pt perineal pain was temporarily  improved, but returned and continued pain in left lower quadrant. Pain in left lower quadrant mildly improvind.   Constipation ..firm stool daily.... since last OV started miralax no change with miralax BMs daily..when takes senokot.. Given lactulose trial....no improvement. Taking daily fiber supplement   LAST OV to eval LLQ pain CT abd Pelvis..neg except moderate stool volume Mild anemia, no iron def.  Nml B12, TSH, nml PSAs  09/08/2009 Referred to urology for consultantion. Told  possible prostadynia/chronic pelvic pain..nothing to be done for this per Dr. Tiana Loft on testim for low testosterone...decided against it.      Problems Prior to Update: 1)  Testicular Hypofunction  (ICD-257.2) 2)  Anemia  (ICD-285.9) 3)  Special Screening  Malig Neoplasms Other Sites  (ICD-V76.49) 4)  Ruq Pain  (ICD-789.01) 5)  ? of Chronic Prostatitis  (ICD-601.1) 6)  Constipation  (ICD-564.00) 7)  Fatigue  (ICD-780.79) 8)  Unspecified Disorder of Male Genital Organs  (ICD-608.9) 9)  Abdominal Pain, Left Lower Quadrant  (ICD-789.04) 10)  Ruq Pain  (ICD-789.01) 11)  Hyperkalemia  (ICD-276.7) 12)  Special Screening Malignant Neoplasm of Prostate  (ICD-V76.44) 13)  Preventive Health Care  (ICD-V70.0) 14)  Seizure Disorder  (ICD-780.39) 15)  Hypertension  (ICD-401.9) 16)  Hyperlipidemia  (ICD-272.4) 17)  Coronary Artery Disease  (ICD-414.00)  Current Medications (verified): 1)  Lisinopril 20 Mg  Tabs (Lisinopril) .... Take 1 Tablet By Mouth Once A Day 2)  Depakote 250 Mg Tbec (Divalproex Sodium) .... Take One By Mouth Two Times A Day 3)  Zetia 10 Mg  Tabs (Ezetimibe) .... Take One By Mouth Once A Day 4)  Multivitamins   Tabs (Multiple Vitamin) .... Take One By Mouth Once A Day 5)  Adult Aspirin Ec Low Strength 81 Mg  Tbec (Aspirin) .... Take 1 Tablet By Mouth Once A Day 6)  Crestor 5 Mg Tabs (Rosuvastatin Calcium) .... One Tablet Once Weekly 7)  Fish Oil 1200 Mg Caps (Omega-3 Fatty Acids) .... Take One Tablet Twice Daily 8)  Lactulose  Powd (Lactulose) .Marland Kitchen.. 15 Ml By Mouth 2 Times Daily, Hold If Diarrhea  Allergies: 1)  * Pravachol 2)  * Crestor  Past History:  Past medical, surgical, family and social histories (including risk factors) reviewed, and no changes noted (except  as noted below).  Past Medical History: Reviewed history from 10/13/2006 and no changes required. Coronary artery disease Hyperlipidemia Hypertension Seizure disorder  CON[facility]ULT[facility] Dr Clarene Duke  [phone] Dr Christella Hartigan  Past Surgical History: Reviewed history from 07/07/2009 and no changes required. Stent LAD (Dr. Clarene Duke) 12/99 Cardiolite negative ,EF 57% 01/05 Stress myoview- EF 61%, negative ischemia 03/08 10/2008 stress test no ischemia, nml EF.  Family  History: Reviewed history from 07/07/2009 and no changes required. Dad died of lung cancer, also had prostate cancer age 76s Mom with HTN Only child  Social History: Reviewed history from 07/07/2009 and no changes required. Marital Status: Married Children: 2, healthy Occupation: Owns  Retail banker runs now, retired Never Smoked Alcohol use-no Drug use-no Regular exercise-no, limited walked. Diet: fruits and veggies, lean meats  Review of Systems General:  Denies fatigue and fever. CV:  Denies chest pain or discomfort. Resp:  Denies shortness of breath. GU:  Denies dysuria. Derm:  Denies rash.  Physical Exam  General:  overweight appearing male in NAD  Mouth:  MMM Neck:  no cervical or supraclavicular lymphadenopathy no carotid bruit or thyromegaly  Lungs:  Normal respiratory effort, chest expands symmetrically. Lungs are clear to auscultation, no crackles or wheezes. Heart:  Normal rate and regular rhythm. S1 and S2 normal without gallop, murmur, click, rub or other extra sounds. Abdomen:  mild ttp llq, no ruq tenderness, soft, obesesoft, no abdominal hernia, no inguinal hernia, no hepatomegaly, and no splenomegaly.   Rectal:  no external abnormalities, no hemorrhoids, normal sphincter tone, no masses, no tenderness, and no fissures.   ANSCOPY: nml Genitalia:  Testes bilaterally descended without nodularity, tenderness or masses. No scrotal masses or lesions. No penis lesions or urethral discharge. Prostate:  Prostate gland firm and smooth, no enlargement, nodularity, tenderness, mass, asymmetry or induration.   Impression & Recommendations:  Problem # 1:  ABDOMINAL PAIN, LEFT LOWER QUADRANT (ICD-789.04) No sign of source of perineal pain on anoscopy. CT Abd Pelvis neg except modearate constipation.Marland KitchenMarland Kitchen? if source of  LLQpain Perineal pain may be from prostadynia/chronic pelvic pain. Encouraged exercsie , weight loss, increase water and fiber. Treat  constipation as below.  His updated medication list for this problem includes:    Amitiza 8 Mcg Caps (Lubiprostone)  Problem # 2:  CONSTIPATION (ICD-564.00)  Start amitiza 8 micrograms.Marland Kitchentwo times a day if no improvement (ie. able to stop stool softners)...may increase to amitiza 24 micrograms two times a day...samples of each given.  if not improving..consider referral to GI.   Orders: Anoscopy (45409)  Problem # 3:  HYPERTENSION (ICD-401.9) Well controlled. Continue current medication.  His updated medication list for this problem includes:    Lisinopril 20 Mg Tabs (Lisinopril) .Marland Kitchen... Take 1 tablet by mouth once a day  Problem # 4:  SEIZURE DISORDER (ICD-780.39) Pt has not has ? seizure or syncopal event in over 20 years...has been on this med about 40 years. Discussed possibly weaning off versus consulting neuro to consider wean. Never had positive EEG in past for diagnosis.  He will consider options, but will remain on for now.  His updated medication list for this problem includes:    Depakote 250 Mg Tbec (Divalproex sodium) .Marland Kitchen... Take one by mouth two times a day  Complete Medication List: 1)  Lisinopril 20 Mg Tabs (Lisinopril) .... Take 1 tablet by mouth once a day 2)  Depakote 250 Mg Tbec (Divalproex sodium) .... Take one by mouth two times a day 3)  Zetia 10 Mg Tabs (  Ezetimibe) .... Take one by mouth once a day 4)  Multivitamins Tabs (Multiple vitamin) .... Take one by mouth once a day 5)  Adult Aspirin Ec Low Strength 81 Mg Tbec (Aspirin) .... Take 1 tablet by mouth once a day 6)  Crestor 5 Mg Tabs (Rosuvastatin calcium) .... One tablet once weekly 7)  Fish Oil 1200 Mg Caps (Omega-3 fatty acids) .... Take one tablet twice daily 8)  Amitiza 8 Mcg Caps (Lubiprostone)  Other Orders: Admin 1st Vaccine (64403) Flu Vaccine 44yrs + (47425)  Patient Instructions: 1)  Continue increasing water and fiber in diet. 2)   Start amitiza 8 micrograms two times a day...if no improvement  in constipation..increase to 24 micrograms two times a day. 3)   Samples given. 4)  Please schedule a follow-up appointment in 6 months .  5)  BMP prior to visit, ICD-9: 272.0 6)  Hepatic Panel prior to visit ICD-9:  7)  Lipid panel prior to visit ICD-9 :    Current Allergies (reviewed today): * PRAVACHOL * CRESTOR              Flu Vaccine Consent Questions     Do you have a history of severe allergic reactions to this vaccine? no    Any prior history of allergic reactions to egg and/or gelatin? no    Do you have a sensitivity to the preservative Thimersol? no    Do you have a past history of Guillan-Barre Syndrome? no    Do you currently have an acute febrile illness? no    Have you ever had a severe reaction to latex? no    Vaccine information given and explained to patient? yes    Are you currently pregnant? no    Lot Number:[id number]   Exp Date:07/25/2010   Site Given  Left Deltoid IMlbflu

## 2010-02-26 NOTE — Progress Notes (Signed)
Summary: refill request for depakote  Phone Note Refill Request Message from:  Fax from Pharmacy  Refills Requested: Medication #1:  DEPAKOTE 250 MG TBEC Take one by mouth two times a day   Last Refilled: 05/09/2009 Faxed request from [facility] is on your desk.  Initial call taken by: Lowella Petties CMA,  Jun 06, 2009 12:54 PM  Follow-up for Phone Call        2 months sent He has appt with Dr Ermalene Searing and note states he is changing to her shouldn't have appt with me then Please check this Follow-up by: Cindee Salt MD,  Jun 06, 2009 1:37 PM  Additional Follow-up for Phone Call Additional follow up Details #1::        patient is switching, cancelled appt with Dr. Alphonsus Sias Additional Follow-up by: Mervin Hack CMA Duncan Dull),  Jun 06, 2009 2:25 PM    New/Updated Medications: DEPAKOTE 250 MG TBEC (DIVALPROEX SODIUM) Take one by mouth two times a day Prescriptions: DEPAKOTE 250 MG TBEC (DIVALPROEX SODIUM) Take one by mouth two times a day  #60 x 1   Entered and Authorized by:   Cindee Salt MD   Signed by:   Cindee Salt MD on 06/06/2009   Method used:   Electronically to        [facility]* (retail)       [id number]       [place], [place]  [address]       Ph: [phone]       Fax: [phone]   RxID:   [id number]

## 2010-03-05 ENCOUNTER — Encounter: Payer: Self-pay | Admitting: Family Medicine

## 2010-04-08 ENCOUNTER — Other Ambulatory Visit: Payer: Self-pay | Admitting: Family Medicine

## 2010-04-08 ENCOUNTER — Encounter: Payer: Self-pay | Admitting: *Deleted

## 2010-04-08 ENCOUNTER — Other Ambulatory Visit (INDEPENDENT_AMBULATORY_CARE_PROVIDER_SITE_OTHER): Payer: No Typology Code available for payment source

## 2010-04-08 DIAGNOSIS — E785 Hyperlipidemia, unspecified: Secondary | ICD-10-CM

## 2010-04-08 LAB — HEPATIC FUNCTION PANEL
ALT: 26 U/L (ref 0–53)
Albumin: 4.2 g/dL (ref 3.5–5.2)
Total Bilirubin: 0.6 mg/dL (ref 0.3–1.2)
Total Protein: 6.4 g/dL (ref 6.0–8.3)

## 2010-04-08 LAB — BASIC METABOLIC PANEL
BUN: 23 mg/dL (ref 6–23)
CO2: 28 mEq/L (ref 19–32)
Calcium: 9.5 mg/dL (ref 8.4–10.5)
Chloride: 106 mEq/L (ref 96–112)
Creatinine, Ser: 1.2 mg/dL (ref 0.4–1.5)
GFR: 64.31 mL/min (ref >60.00)
Glucose, Bld: 91 mg/dL (ref 70–99)
Potassium: 4.9 mEq/L (ref 3.5–5.1)
Sodium: 140 mEq/L (ref 135–145)

## 2010-04-08 LAB — LIPID PANEL
Cholesterol: 166 mg/dL (ref 0–200)
Triglycerides: 116 mg/dL (ref 0.0–149.0)

## 2010-04-21 ENCOUNTER — Other Ambulatory Visit: Payer: Self-pay

## 2010-04-28 ENCOUNTER — Ambulatory Visit (INDEPENDENT_AMBULATORY_CARE_PROVIDER_SITE_OTHER): Payer: No Typology Code available for payment source | Admitting: Family Medicine

## 2010-04-28 ENCOUNTER — Encounter: Payer: Self-pay | Admitting: Family Medicine

## 2010-04-28 DIAGNOSIS — E785 Hyperlipidemia, unspecified: Secondary | ICD-10-CM

## 2010-04-28 DIAGNOSIS — I10 Essential (primary) hypertension: Secondary | ICD-10-CM

## 2010-04-28 DIAGNOSIS — I2510 Atherosclerotic heart disease of native coronary artery without angina pectoris: Secondary | ICD-10-CM

## 2010-04-28 NOTE — Patient Instructions (Signed)
Continue excellent work on lifestyle change... Increase exercise and weight loss.  Try increasing crestor to 3 times a week if tolerated... If you do increase theis med we can recheck lipids before your physical.

## 2010-04-28 NOTE — Assessment & Plan Note (Signed)
Followed by Dr. Clarene Duke . Last note reviewed.

## 2010-04-28 NOTE — Progress Notes (Signed)
  Subjective:    Patient ID: Joshua Stark, male    DOB: 11/29/1945, 65 y.o.   MRN: [id number]  HPI  Hypertension: Well controlled on lisinopril.  No SE.   High cholesterol:On crestor 5 mg daily... improved control almost at goal <70 given CAD. Only mild leg ache.. Tolerable.  Lab Results  Component Value Date   CHOL 166 04/08/2010   CHOL 186 07/01/2009   CHOL 190 03/07/2008   Lab Results  Component Value Date   HDL 49.90 04/08/2010   HDL 40.98 07/01/2009   HDL 49 03/07/2008   Lab Results  Component Value Date   LDLCALC 93 04/08/2010   LDLCALC 112* 07/01/2009   LDLCALC 117* 03/07/2008   Lab Results  Component Value Date   TRIG 116.0 04/08/2010   TRIG 144.0 07/01/2009   TRIG 122 03/07/2008   Lab Results  Component Value Date   CHOLHDL 3 04/08/2010   CHOLHDL 4 07/01/2009   CHOLHDL 3.9 Ratio 03/07/2008    CAD, followed by Dr. Clarene Duke.    Review of Systems  Constitutional: Positive for fatigue. Negative for fever and appetite change.  HENT: Negative for ear pain.   Eyes: Negative for pain.  Respiratory: Negative for shortness of breath and wheezing.   Cardiovascular: Negative for chest pain, palpitations and leg swelling.  Gastrointestinal: Positive for abdominal pain.       Chronic abdominal pain and perineal pain... See previous.  Genitourinary: Negative for dysuria.  Musculoskeletal: Negative for back pain.       Objective:   Physical Exam  Constitutional: Vital signs are normal. He appears well-developed and well-nourished.  HENT:  Head: Normocephalic.  Right Ear: Hearing normal.  Left Ear: Hearing normal.  Nose: Nose normal.  Mouth/Throat: Oropharynx is clear and moist and mucous membranes are normal.  Neck: Trachea normal. Carotid bruit is not present. No mass and no thyromegaly present.  Cardiovascular: Normal rate, regular rhythm and normal pulses.  Exam reveals distant heart sounds. Exam reveals no gallop and no friction rub.   No murmur heard. Pulmonary/Chest:  Effort normal and breath sounds normal. No respiratory distress.  Skin: Skin is warm, dry and intact. No rash noted.  Psychiatric: He has a normal mood and affect. His speech is normal and behavior is normal. Thought content normal.          Assessment & Plan:

## 2010-04-28 NOTE — Assessment & Plan Note (Addendum)
Improved control but not at goal< 70.  Pt will discuss with his cardiologist.. I recommend increasing to 3 times a week if SE tolerated. Encouraged exercise, weight loss, healthy eating habits.

## 2010-04-28 NOTE — Assessment & Plan Note (Signed)
Well controlled. Continue current medication.  

## 2010-07-07 ENCOUNTER — Other Ambulatory Visit: Payer: Self-pay | Admitting: *Deleted

## 2010-07-07 MED ORDER — DIVALPROEX SODIUM 250 MG PO DR TAB: 250.0000 mg | DELAYED_RELEASE_TABLET | Freq: Two times a day (BID) | ORAL | Status: DC

## 2010-08-03 ENCOUNTER — Other Ambulatory Visit (INDEPENDENT_AMBULATORY_CARE_PROVIDER_SITE_OTHER): Payer: No Typology Code available for payment source | Admitting: Family Medicine

## 2010-08-03 ENCOUNTER — Telehealth (INDEPENDENT_AMBULATORY_CARE_PROVIDER_SITE_OTHER): Payer: No Typology Code available for payment source | Admitting: Family Medicine

## 2010-08-03 DIAGNOSIS — E785 Hyperlipidemia, unspecified: Secondary | ICD-10-CM

## 2010-08-03 DIAGNOSIS — Z125 Encounter for screening for malignant neoplasm of prostate: Secondary | ICD-10-CM

## 2010-08-03 LAB — COMPREHENSIVE METABOLIC PANEL
ALT: 18 U/L (ref 0–53)
AST: 23 U/L (ref 0–37)
Albumin: 4.2 g/dL (ref 3.5–5.2)
Alkaline Phosphatase: 58 U/L (ref 39–117)
Potassium: 4.8 mEq/L (ref 3.5–5.1)
Sodium: 140 mEq/L (ref 135–145)
Total Bilirubin: 0.5 mg/dL (ref 0.3–1.2)
Total Protein: 6.5 g/dL (ref 6.0–8.3)

## 2010-08-03 LAB — LIPID PANEL
HDL: 47.4 mg/dL (ref >39.00)
LDL Cholesterol: 116 mg/dL — ABNORMAL HIGH (ref 0–99)
Total CHOL/HDL Ratio: 4
VLDL: 19 mg/dL (ref 0.0–40.0)

## 2010-08-03 NOTE — Telephone Encounter (Signed)
Message copied by Excell Seltzer on Mon Aug 03, 2010  8:43 AM ------      Message from: Alvina Chou      Created: Mon Aug 03, 2010  8:38 AM       Patient walked in for lab work today, has a 3 month f/u appt with you tomorrow. Please order labs, T

## 2010-08-04 ENCOUNTER — Ambulatory Visit (INDEPENDENT_AMBULATORY_CARE_PROVIDER_SITE_OTHER): Payer: No Typology Code available for payment source | Admitting: Family Medicine

## 2010-08-04 ENCOUNTER — Encounter: Payer: Self-pay | Admitting: Family Medicine

## 2010-08-04 DIAGNOSIS — I10 Essential (primary) hypertension: Secondary | ICD-10-CM

## 2010-08-04 DIAGNOSIS — E785 Hyperlipidemia, unspecified: Secondary | ICD-10-CM

## 2010-08-04 DIAGNOSIS — I2510 Atherosclerotic heart disease of native coronary artery without angina pectoris: Secondary | ICD-10-CM

## 2010-08-04 NOTE — Patient Instructions (Addendum)
Increase exercise if able. Work aggressively on lifestyle change : diet and weight loss. Review info packet on cholesterol.

## 2010-08-04 NOTE — Assessment & Plan Note (Signed)
Well controlled. Continue current medication.  

## 2010-08-04 NOTE — Progress Notes (Signed)
  Subjective:    Patient ID: Joshua Stark, male    DOB: [birth year], 65 y.o.   MRN: [id number]  HPI  Hypertension:  On lisinopril 20   Using medication without problems or lightheadedness:  Chest pain with exertion: None Edema:None Short of breath:None Average home BPs:120/70-80   Elevated Cholesterol: On crestor 5 mg daily..taking only twice a week because of muscle cramps Using medications without problems:Yes Muscle aches: Yes Other complaints: NOne  Blood sugar slightly elevated... prediabetes  Walking every day  Chronic lower abdominal pain and chronic perineal pain gradually improving overtime. Tolerable. See past work up.  Constipation better on amitiza.  Review of Systems  Constitutional: Negative for fever and fatigue.  HENT: Negative for ear pain.   Eyes: Negative for pain.  Respiratory: Negative for cough and shortness of breath.   Cardiovascular: Negative for chest pain, palpitations and leg swelling.  Gastrointestinal: Negative for diarrhea.       Objective:   Physical Exam  Constitutional: Vital signs are normal. He appears well-developed and well-nourished.  HENT:  Head: Normocephalic.  Right Ear: Hearing normal.  Left Ear: Hearing normal.  Nose: Nose normal.  Mouth/Throat: Oropharynx is clear and moist and mucous membranes are normal.  Neck: Trachea normal. Carotid bruit is not present. No mass and no thyromegaly present.  Cardiovascular: Normal rate, regular rhythm and normal pulses.  Exam reveals no gallop, no distant heart sounds and no friction rub.   No murmur heard.      No peripheral edema  Pulmonary/Chest: Effort normal and breath sounds normal. No respiratory distress.  Skin: Skin is warm, dry and intact. No rash noted.  Psychiatric: He has a normal mood and affect. His speech is normal and behavior is normal. Thought content normal.          Assessment & Plan:

## 2010-08-04 NOTE — Assessment & Plan Note (Signed)
Inadequate control of cholesterol on crestor 2 times a week. Pt very hesitant about med change/increase despite risk of repeat MI. He states he will work aggressively on diet and exercise and weight loss. Recheck in 3 months. Consider addition of welchol at that time.

## 2010-08-26 DIAGNOSIS — Z9289 Personal history of other medical treatment: Secondary | ICD-10-CM

## 2010-08-26 HISTORY — DX: Personal history of other medical treatment: Z92.89

## 2010-08-27 HISTORY — PX: NM MYOCAR PERF WALL MOTION: HXRAD629

## 2010-11-26 ENCOUNTER — Ambulatory Visit (INDEPENDENT_AMBULATORY_CARE_PROVIDER_SITE_OTHER): Payer: No Typology Code available for payment source | Admitting: Family Medicine

## 2010-11-26 ENCOUNTER — Encounter: Payer: Self-pay | Admitting: Family Medicine

## 2010-11-26 VITALS — BP 116/78 | HR 46 | Temp 98.0°F | Ht 71.0 in | Wt 276.4 lb

## 2010-11-26 DIAGNOSIS — B379 Candidiasis, unspecified: Secondary | ICD-10-CM

## 2010-11-26 DIAGNOSIS — H60549 Acute eczematoid otitis externa, unspecified ear: Secondary | ICD-10-CM | POA: Insufficient documentation

## 2010-11-26 DIAGNOSIS — Z23 Encounter for immunization: Secondary | ICD-10-CM

## 2010-11-26 DIAGNOSIS — H60509 Unspecified acute noninfective otitis externa, unspecified ear: Secondary | ICD-10-CM

## 2010-11-26 MED ORDER — NYSTATIN 100000 UNIT/GM EX CREA: TOPICAL_CREAM | Freq: Two times a day (BID) | CUTANEOUS | Status: DC

## 2010-11-26 NOTE — Assessment & Plan Note (Signed)
Treat with OTC cortisone 10 cream twice daily, call if not improving.

## 2010-11-26 NOTE — Assessment & Plan Note (Signed)
Treat with nystatin cream. 

## 2010-11-26 NOTE — Progress Notes (Signed)
  Subjective:    Patient ID: Joshua Stark, male    DOB: 1945/06/23, 65 y.o.   MRN: [id number]  HPI  65 year old male presents with redness x severval weeks at umbilicus. No itchiness, slight burning. No witnessed bug bite. Has noted some red bumps over to right of belly button in last few days.  no fever. No flu like symptoms.   Right ear feels full for past few weeks, itchy, no pain. No congestion, no URI symptoms. No headache.   Review of Systems  Constitutional: Negative for fever and fatigue.  Respiratory: Negative for shortness of breath.   Cardiovascular: Negative for chest pain, palpitations and leg swelling.  Gastrointestinal: Negative for abdominal pain, diarrhea, constipation and blood in stool.       Objective:   Physical Exam  Constitutional: He appears well-developed and well-nourished.  HENT:  Right Ear: Hearing and tympanic membrane normal.  Left Ear: Hearing and tympanic membrane normal.  Nose: No mucosal edema or rhinorrhea. Right sinus exhibits no maxillary sinus tenderness and no frontal sinus tenderness. Left sinus exhibits no maxillary sinus tenderness and no frontal sinus tenderness.  Mouth/Throat: Mucous membranes are normal. Normal dentition. No dental caries. No oropharyngeal exudate or posterior oropharyngeal edema.       Right ear canal with flaky dry tissue at opening, otherwise nml, no redness, no swelling  Neck: Normal range of motion. Neck supple.  Skin:       Erythema at umbilicus, with serveral satelitte lesions, no mass, no pain          Assessment & Plan:

## 2010-11-26 NOTE — Patient Instructions (Signed)
Ear: Treat with OTC cortisone 10 cream twice daily, call if not improving. Belly: yeast infection... Keep area dry. Treat with nystatin cream , continue for 48 hours after rash resolved.

## 2011-01-04 ENCOUNTER — Other Ambulatory Visit: Payer: Self-pay | Admitting: *Deleted

## 2011-01-04 MED ORDER — DIVALPROEX SODIUM 250 MG PO DR TAB: 250.0000 mg | DELAYED_RELEASE_TABLET | Freq: Two times a day (BID) | ORAL | Status: DC

## 2011-01-07 ENCOUNTER — Encounter: Payer: Self-pay | Admitting: Family Medicine

## 2011-01-07 ENCOUNTER — Ambulatory Visit (INDEPENDENT_AMBULATORY_CARE_PROVIDER_SITE_OTHER): Payer: BC Managed Care – PPO | Admitting: Family Medicine

## 2011-01-07 VITALS — BP 138/70 | HR 64 | Temp 97.9°F | Ht 71.0 in | Wt 277.8 lb

## 2011-01-07 DIAGNOSIS — B379 Candidiasis, unspecified: Secondary | ICD-10-CM

## 2011-01-07 MED ORDER — NYSTATIN 100000 UNIT/GM EX CREA: TOPICAL_CREAM | Freq: Two times a day (BID) | CUTANEOUS | Status: DC

## 2011-01-07 MED ORDER — FLUCONAZOLE 150 MG PO TABS: 150.0000 mg | ORAL_TABLET | Freq: Once | ORAL | Status: AC

## 2011-01-07 NOTE — Patient Instructions (Signed)
Apply cream twice daily after taking oral medication x 1.  Call if not improving rash in 2 weeks.

## 2011-01-07 NOTE — Progress Notes (Signed)
  Subjective:    Patient ID: Joshua Stark, male    DOB: 03/24/1945, 65 y.o.   MRN: [id number]  Rash Chronicity: ongoing. The current episode started more than 1 month ago. The problem is unchanged. Pain location: at belly button, now spreading down below navel. Rash characteristics: no pustules, no blisters. He was exposed to nothing. Pertinent negatives include no cough, eye pain, fever, joint pain, shortness of breath or sore throat. (Occ burning in stomach) Treatments tried: used nystatin cream once a day.. never used twice a day.... used for 3 weeks... minimal improvement. The treatment provided no relief. There is no history of allergies, asthma, eczema or varicella.      Review of Systems  Constitutional: Negative for fever.  HENT: Negative for sore throat.   Eyes: Negative for pain.  Respiratory: Negative for cough and shortness of breath.   Musculoskeletal: Negative for joint pain.  Skin: Positive for rash.       Objective:   Physical Exam  Constitutional: Vital signs are normal. He appears well-developed and well-nourished.  HENT:  Head: Normocephalic.  Right Ear: Hearing normal.  Left Ear: Hearing normal.  Nose: Nose normal.  Mouth/Throat: Oropharynx is clear and moist and mucous membranes are normal.  Neck: Trachea normal. Carotid bruit is not present. No mass and no thyromegaly present.  Cardiovascular: Normal rate, regular rhythm and normal pulses.  Exam reveals no gallop, no distant heart sounds and no friction rub.   No murmur heard.      No peripheral edema  Pulmonary/Chest: Effort normal and breath sounds normal. No respiratory distress.  Skin: Skin is warm, dry and intact. Rash noted.       Mild erythema at belly button, occ red bumps surrounding, red patch in pannus crease, slightly flaky  Psychiatric: His speech is normal.          Assessment & Plan:

## 2011-01-07 NOTE — Assessment & Plan Note (Signed)
I still  Believe this is due to candidal infection. Not improved since not treating fully. Will treat with diflucan followed by nystatin cream BID. No suggestion of bacterial infection.  If not improving in 2 weeks consider different diagnosis and possible referral to [facility].

## 2011-01-22 ENCOUNTER — Telehealth: Payer: Self-pay | Admitting: Internal Medicine

## 2011-01-22 DIAGNOSIS — R21 Rash and other nonspecific skin eruption: Secondary | ICD-10-CM

## 2011-01-22 NOTE — Telephone Encounter (Signed)
Patient called and stated his rash after using the Nystatin cream has not resolved.  He wanted to know any suggestions.

## 2011-01-22 NOTE — Telephone Encounter (Signed)
Patient is fine with referral to derm.

## 2011-01-22 NOTE — Telephone Encounter (Signed)
Recommend referral to Derm. Let me know if pt agreeable.

## 2011-01-25 ENCOUNTER — Other Ambulatory Visit (HOSPITAL_COMMUNITY): Payer: Self-pay | Admitting: Orthopedic Surgery

## 2011-01-25 ENCOUNTER — Ambulatory Visit (HOSPITAL_COMMUNITY)
Admission: RE | Admit: 2011-01-25 | Discharge: 2011-01-25 | Disposition: A | Discharge status: Home / Self Care | Payer: BC Managed Care – PPO | Source: Ambulatory Visit | Attending: Orthopedic Surgery | Admitting: Orthopedic Surgery

## 2011-01-25 DIAGNOSIS — Z1389 Encounter for screening for other disorder: Secondary | ICD-10-CM | POA: Insufficient documentation

## 2011-02-03 ENCOUNTER — Other Ambulatory Visit: Payer: Self-pay | Admitting: *Deleted

## 2011-02-03 MED ORDER — NYSTATIN 100000 UNIT/GM EX CREA: TOPICAL_CREAM | Freq: Two times a day (BID) | CUTANEOUS | Status: AC

## 2011-07-06 ENCOUNTER — Other Ambulatory Visit: Payer: Self-pay | Admitting: *Deleted

## 2011-07-06 DIAGNOSIS — R569 Unspecified convulsions: Secondary | ICD-10-CM

## 2011-07-06 MED ORDER — DIVALPROEX SODIUM 250 MG PO DR TAB: 250.0000 mg | DELAYED_RELEASE_TABLET | Freq: Two times a day (BID) | ORAL | Status: DC

## 2011-07-06 NOTE — Telephone Encounter (Signed)
Pt needs to come in for valproic acid level.  Can add to labs for CPX which I believe he is due for in 07/2011, please schedule.

## 2011-07-07 NOTE — Telephone Encounter (Signed)
Spoke with patient and he is going to schedule labs and cpx

## 2011-07-07 NOTE — Telephone Encounter (Signed)
jamie can you schedule please???????????

## 2011-07-18 ENCOUNTER — Telehealth: Payer: Self-pay | Admitting: Family Medicine

## 2011-07-18 DIAGNOSIS — Z125 Encounter for screening for malignant neoplasm of prostate: Secondary | ICD-10-CM

## 2011-07-18 NOTE — Telephone Encounter (Signed)
Message copied by Excell Seltzer on Sun Jul 18, 2011 11:18 PM ------      Message from: Baldomero Lamy      Created: Thu Jul 15, 2011 11:06 AM      Regarding: cpx labs  Tues 6/25       Please order  future cpx labs for pt's upcomming lab appt. Pt has some labs ordered from a previous f/u. Do you want only these labs done or would you like to add anything.      Thanks      Rodney Booze

## 2011-07-20 ENCOUNTER — Telehealth: Payer: Self-pay | Admitting: Family Medicine

## 2011-07-20 ENCOUNTER — Other Ambulatory Visit (INDEPENDENT_AMBULATORY_CARE_PROVIDER_SITE_OTHER): Payer: BC Managed Care – PPO

## 2011-07-20 DIAGNOSIS — E785 Hyperlipidemia, unspecified: Secondary | ICD-10-CM

## 2011-07-20 DIAGNOSIS — Z125 Encounter for screening for malignant neoplasm of prostate: Secondary | ICD-10-CM

## 2011-07-20 DIAGNOSIS — E875 Hyperkalemia: Secondary | ICD-10-CM

## 2011-07-20 DIAGNOSIS — R569 Unspecified convulsions: Secondary | ICD-10-CM

## 2011-07-20 LAB — COMPREHENSIVE METABOLIC PANEL WITH GFR
ALT: 24 U/L (ref 0–53)
AST: 22 U/L (ref 0–37)
Albumin: 3.9 g/dL (ref 3.5–5.2)
Alkaline Phosphatase: 54 U/L (ref 39–117)
BUN: 40 mg/dL — ABNORMAL HIGH (ref 6–23)
CO2: 26 meq/L (ref 19–32)
Calcium: 9.6 mg/dL (ref 8.4–10.5)
Chloride: 107 meq/L (ref 96–112)
Creatinine, Ser: 1.5 mg/dL (ref 0.4–1.5)
GFR: 51.16 mL/min — ABNORMAL LOW
Glucose, Bld: 98 mg/dL (ref 70–99)
Potassium: 5.6 meq/L — ABNORMAL HIGH (ref 3.5–5.1)
Sodium: 140 meq/L (ref 135–145)
Total Bilirubin: 0.5 mg/dL (ref 0.3–1.2)
Total Protein: 6.3 g/dL (ref 6.0–8.3)

## 2011-07-20 LAB — LIPID PANEL
Cholesterol: 180 mg/dL (ref 0–200)
LDL Cholesterol: 111 mg/dL — ABNORMAL HIGH (ref 0–99)
VLDL: 18.2 mg/dL (ref 0.0–40.0)

## 2011-07-20 LAB — PSA: PSA: 0.34 ng/mL (ref 0.10–4.00)

## 2011-07-20 NOTE — Telephone Encounter (Signed)
LAbs reviewed.Marland Kitchen accidentlaly close lab encounter. Call pt potassium is too high. Hold lisinopril.. Is he taking any potassium supplemtn or vitamin.. Stop. Stop any high potassium foods like bannana, blueberry etc. Return for recheck in 2-3 days.

## 2011-07-21 NOTE — Telephone Encounter (Signed)
Patient will call back for appt and advised of results

## 2011-07-23 ENCOUNTER — Telehealth: Payer: Self-pay | Admitting: Family Medicine

## 2011-07-23 ENCOUNTER — Other Ambulatory Visit (INDEPENDENT_AMBULATORY_CARE_PROVIDER_SITE_OTHER): Payer: BC Managed Care – PPO

## 2011-07-23 DIAGNOSIS — E875 Hyperkalemia: Secondary | ICD-10-CM

## 2011-07-23 LAB — BASIC METABOLIC PANEL
CO2: 26 mEq/L (ref 19–32)
Calcium: 9.4 mg/dL (ref 8.4–10.5)
Creatinine, Ser: 1.4 mg/dL (ref 0.4–1.5)
Glucose, Bld: 92 mg/dL (ref 70–99)

## 2011-07-23 MED ORDER — SODIUM POLYSTYRENE SULFONATE 15 GM/60ML PO SUSP: ORAL | Status: DC

## 2011-07-23 NOTE — Telephone Encounter (Signed)
Patient notified. He will take kayexalate and follow up next week.

## 2011-07-23 NOTE — Telephone Encounter (Signed)
Notify pt that potassium remains elevated. Will send in rx for kayexylate.. Take x 2 days.. Will cause diarrhea. Will recheck at upcoming appt.

## 2011-07-27 ENCOUNTER — Telehealth: Payer: Self-pay | Admitting: *Deleted

## 2011-07-27 ENCOUNTER — Encounter: Payer: Self-pay | Admitting: Family Medicine

## 2011-07-27 ENCOUNTER — Ambulatory Visit (INDEPENDENT_AMBULATORY_CARE_PROVIDER_SITE_OTHER): Payer: BC Managed Care – PPO | Admitting: Family Medicine

## 2011-07-27 VITALS — BP 130/82 | HR 53 | Temp 98.8°F | Ht 71.0 in

## 2011-07-27 DIAGNOSIS — Z0000 Encounter for general adult medical examination without abnormal findings: Secondary | ICD-10-CM

## 2011-07-27 DIAGNOSIS — E785 Hyperlipidemia, unspecified: Secondary | ICD-10-CM

## 2011-07-27 DIAGNOSIS — E875 Hyperkalemia: Secondary | ICD-10-CM | POA: Insufficient documentation

## 2011-07-27 DIAGNOSIS — I10 Essential (primary) hypertension: Secondary | ICD-10-CM

## 2011-07-27 NOTE — Assessment & Plan Note (Signed)
Inadequate control.. Will discuss welchol or red yeast rice with Dr. Cl[facility].

## 2011-07-27 NOTE — Telephone Encounter (Signed)
Push fluids, make sure getting 64 oz a dayof water especially in summer. optherwise for leg cramps.. Can try 1-2 tablespoons of yellow mustard at time of cramps.Marland Kitchen Helps get rid of lactic acid.

## 2011-07-27 NOTE — Progress Notes (Signed)
Subjective:    Patient ID: Joshua Stark, male    DOB: 1945/07/31, 66 y.o.   MRN: [id number]  HPI The patient is here for annual wellness exam and preventative care.    Hypertension:  BP at goal despite Holding lisinopril...due to to hyperkalemia... He reports that he has been taking potassium supplement and leg cramp pills. He has taken on dose last night (could not pick up before then). Using medication without problems or lightheadedness: None Chest pain with exertion:None Edema:None Short of breath:None Average home BPs:Not checking. Other issues:  Wt Readings from Last 3 Encounters:  01/07/11 277 lb 12 oz (125.987 kg)  11/26/10 276 lb 6.4 oz (125.374 kg)  08/04/10 265 lb 12.8 oz (120.566 kg)  Today wt 260  Elevated Cholesterol:Not at goal LDL <70, no longer on crestor a week given mucsle cramps. Lab Results  Component Value Date   CHOL 180 07/20/2011   HDL 50.70 07/20/2011   LDLCALC 111* 07/20/2011   TRIG 91.0 07/20/2011   CHOLHDL 4 07/20/2011   Using medications without problems:None Muscle aches: None Diet compliance: Exercise: Plantar fasciitis, right knee limits some now..followed by Neila Gear and Dr. Charlsie Merles at [facility]. Has had two shots in foot. Other complaints:    Review of Systems  Constitutional: Negative for fever, fatigue and unexpected weight change.  HENT: Negative for ear pain, congestion, sore throat, rhinorrhea, trouble swallowing and postnasal drip.   Eyes: Negative for pain.  Respiratory: Negative for cough, shortness of breath and wheezing.   Cardiovascular: Negative for chest pain, palpitations and leg swelling.  Gastrointestinal: Positive for constipation. Negative for nausea, abdominal pain, diarrhea and blood in stool.  Genitourinary: Negative for dysuria, urgency, hematuria, discharge, penile swelling, scrotal swelling, difficulty urinating, penile pain and testicular pain.  Musculoskeletal: Positive for arthralgias.  Skin: Negative for rash.   Neurological: Negative for syncope, weakness, light-headedness, numbness and headaches.  Psychiatric/Behavioral: Negative for behavioral problems and dysphoric mood. The patient is not nervous/anxious.        Objective:   Physical Exam  Constitutional: He appears well-developed and well-nourished.  Non-toxic appearance. He does not appear ill. No distress.       Obese, central obesity  HENT:  Head: Normocephalic and atraumatic.  Right Ear: Hearing, tympanic membrane, external ear and ear canal normal.  Left Ear: Hearing, tympanic membrane, external ear and ear canal normal.  Nose: Nose normal.  Mouth/Throat: Uvula is midline, oropharynx is clear and moist and mucous membranes are normal.  Eyes: Conjunctivae, EOM and lids are normal. Pupils are equal, round, and reactive to light. No foreign bodies found.  Neck: Trachea normal, normal range of motion and phonation normal. Neck supple. Carotid bruit is not present. No mass and no thyromegaly present.  Cardiovascular: Normal rate, regular rhythm, S1 normal, S2 normal, intact distal pulses and normal pulses.  Exam reveals no gallop.   No murmur heard. Pulmonary/Chest: Breath sounds normal. He has no wheezes. He has no rhonchi. He has no rales.  Abdominal: Soft. Normal appearance and bowel sounds are normal. There is no hepatosplenomegaly. There is no tenderness. There is no rebound, no guarding and no CVA tenderness. No hernia. Hernia confirmed negative in the right inguinal area and confirmed negative in the left inguinal area.  Genitourinary: Prostate normal, testes normal and penis normal. Rectal exam shows no external hemorrhoid, no internal hemorrhoid, no fissure, no mass, no tenderness and anal tone normal. Guaiac negative stool. Prostate is not enlarged and not tender. Right testis  shows no mass and no tenderness. Left testis shows no mass and no tenderness. No paraphimosis or penile tenderness.  Lymphadenopathy:    He has no cervical  adenopathy.       Right: No inguinal adenopathy present.       Left: No inguinal adenopathy present.  Neurological: He is alert. He has normal strength and normal reflexes. No cranial nerve deficit or sensory deficit. Gait normal.  Skin: Skin is warm, dry and intact. No rash noted.  Psychiatric: He has a normal mood and affect. His speech is normal and behavior is normal. Judgment normal.          Assessment & Plan:  The patient's preventative maintenance and recommended screening tests for an annual wellness exam were reviewed in full today. Brought up to date unless services declined.  Counselled on the importance of diet, exercise, and its role in overall health and mortality. The patient's FH and SH was reviewed, including their home life, tobacco status, and drug and alcohol status.   Lab Results  Component Value Date   PSA 0.34 07/20/2011   PSA 0.32 08/03/2010   PSA 0.36 07/01/2009    Colon: ifob 2011, colonoscopy 2006 neg, repeat in 2016. Vaccines: Uptodate with TD, not interested in shingles vaccine. Nonsmoker:

## 2011-07-27 NOTE — Assessment & Plan Note (Addendum)
Restart lisinopril. Well controlled.

## 2011-07-27 NOTE — Patient Instructions (Addendum)
We will call you with today's potassium level. Stop all supplements. Restart lisinopril when potassium in nml range.  Talk with Dr. Clarene [facility] about treating cholesterol with [facility], or RED YEAST RICE. Go ahead with vinegar.  Keep working on exercise , weight loss and healthy eating.

## 2011-07-27 NOTE — Telephone Encounter (Signed)
Patient advised of recommendations.  

## 2011-07-27 NOTE — Telephone Encounter (Signed)
Patient wants to know what he can do for the leg cramps now that he is not taken the leg cramp medication

## 2011-07-27 NOTE — Assessment & Plan Note (Signed)
Likely due to supplements. Stop. Recheck after one dose of kayexylate.

## 2011-11-12 ENCOUNTER — Ambulatory Visit (INDEPENDENT_AMBULATORY_CARE_PROVIDER_SITE_OTHER): Payer: BC Managed Care – PPO

## 2011-11-12 DIAGNOSIS — Z23 Encounter for immunization: Secondary | ICD-10-CM

## 2012-01-06 ENCOUNTER — Other Ambulatory Visit: Payer: Self-pay

## 2012-01-06 NOTE — Telephone Encounter (Signed)
[facility] faxed refill for Depakote. Last filled 12/07/11. Baird Lyons at [facility] said to put on rx DAW 1.Please advise.

## 2012-01-07 MED ORDER — DIVALPROEX SODIUM 250 MG PO DR TAB: 250.0000 mg | DELAYED_RELEASE_TABLET | Freq: Two times a day (BID) | ORAL | Status: DC

## 2012-06-07 ENCOUNTER — Other Ambulatory Visit: Payer: Self-pay | Admitting: *Deleted

## 2012-06-07 NOTE — Telephone Encounter (Signed)
Pt due for CPX in 07/2012.. Will need valproic acid level then... Refill until appt made.

## 2012-06-08 MED ORDER — DIVALPROEX SODIUM 250 MG PO DR TAB: 250.0000 mg | DELAYED_RELEASE_TABLET | Freq: Two times a day (BID) | ORAL | Status: DC

## 2012-08-07 ENCOUNTER — Other Ambulatory Visit: Payer: Self-pay | Admitting: *Deleted

## 2012-08-07 MED ORDER — AMLODIPINE BESYLATE 5 MG PO TABS: 5.0000 mg | ORAL_TABLET | Freq: Every day | ORAL | Status: DC

## 2012-08-07 NOTE — Telephone Encounter (Signed)
Received faxed refill request from pharmacy. Last office visit 07/27/11. Is it okay to refill medication?

## 2012-08-08 MED ORDER — DIVALPROEX SODIUM 250 MG PO DR TAB: 250.0000 mg | DELAYED_RELEASE_TABLET | Freq: Two times a day (BID) | ORAL | Status: DC

## 2012-09-07 ENCOUNTER — Other Ambulatory Visit: Payer: Self-pay | Admitting: *Deleted

## 2012-09-07 MED ORDER — LISINOPRIL 40 MG PO TABS: 40.0000 mg | ORAL_TABLET | Freq: Every day | ORAL | Status: DC

## 2012-09-07 NOTE — Telephone Encounter (Signed)
Rx was sent to pharmacy electronically. 

## 2012-09-17 ENCOUNTER — Encounter: Payer: Self-pay | Admitting: *Deleted

## 2012-09-22 ENCOUNTER — Encounter: Payer: Self-pay | Admitting: Cardiology

## 2012-09-26 ENCOUNTER — Ambulatory Visit (INDEPENDENT_AMBULATORY_CARE_PROVIDER_SITE_OTHER): Payer: BC Managed Care – PPO | Admitting: Cardiology

## 2012-09-26 ENCOUNTER — Encounter: Payer: Self-pay | Admitting: Cardiology

## 2012-09-26 VITALS — BP 120/60 | HR 51 | Ht 71.0 in | Wt 268.1 lb

## 2012-09-26 DIAGNOSIS — I10 Essential (primary) hypertension: Secondary | ICD-10-CM

## 2012-09-26 DIAGNOSIS — E669 Obesity, unspecified: Secondary | ICD-10-CM

## 2012-09-26 DIAGNOSIS — Z9861 Coronary angioplasty status: Secondary | ICD-10-CM

## 2012-09-26 DIAGNOSIS — E785 Hyperlipidemia, unspecified: Secondary | ICD-10-CM

## 2012-09-26 DIAGNOSIS — I2510 Atherosclerotic heart disease of native coronary artery without angina pectoris: Secondary | ICD-10-CM

## 2012-09-26 NOTE — Patient Instructions (Addendum)
Seemed to be doing well from our perspective.  Her blood pressure looks good. No changes to medications at this time.  I want to make sure that you're seen at alternating interval with your primary physician.  Will do is have you see my PAs in January for break timeframe.  After that and I will see you on annual intervals from that point.  I will be looking for your cholesterol levels that are checked by your primary physician in the upcoming visit.  You should hear from the office within 2 months at the time of your appointment, to confirm the date and time of your point. 2

## 2012-10-06 ENCOUNTER — Other Ambulatory Visit: Payer: Self-pay | Admitting: Family Medicine

## 2012-10-13 ENCOUNTER — Encounter: Payer: Self-pay | Admitting: Cardiology

## 2012-10-13 DIAGNOSIS — I2510 Atherosclerotic heart disease of native coronary artery without angina pectoris: Secondary | ICD-10-CM | POA: Insufficient documentation

## 2012-10-13 DIAGNOSIS — I10 Essential (primary) hypertension: Secondary | ICD-10-CM | POA: Insufficient documentation

## 2012-10-13 DIAGNOSIS — E785 Hyperlipidemia, unspecified: Secondary | ICD-10-CM | POA: Insufficient documentation

## 2012-10-13 DIAGNOSIS — E669 Obesity, unspecified: Secondary | ICD-10-CM | POA: Insufficient documentation

## 2012-10-13 NOTE — Assessment & Plan Note (Signed)
Last labs I have on him are from June of last year he says he has followup with his primary physician who checks his labs.  We will try to contact his PCPs office to get results.  If he is not have injected that time he comes back in January, we will go ahead and get it checked here for our records. His statin intolerance, having been on this but every single statin.  He is currently only on Zetia.  Based on his last lab would recommend TriCor - which I tried to start him on last time this year.  He never did get the prescription filled.

## 2012-10-13 NOTE — Assessment & Plan Note (Signed)
He now is that he's had some dietary indiscretions of late.  We went back over some preventative dietary modifications.  One consideration would be that he is referred to a dietitian.  For that reason, I like to see him more frequently than yearly.  I talked about dietary modification with decreased carbohydrate and fatty protein intake with increased vegetables and fruits, lean proteins and cereals. Hopefully if his knee arthritis gets under control, he will able to get out and do more walking in order to burn calories.

## 2012-10-13 NOTE — Assessment & Plan Note (Signed)
She stable no active symptoms of angina.  He is on aspirin, and ACE inhibitor as well as and Zetia.  He is not on a statin, due to statin intolerance.  He is not beta blocker due to bradycardia.   No longer on a dual antiplatelet therapy due to the bare-metal stent over 15 years ago.

## 2012-10-13 NOTE — Assessment & Plan Note (Signed)
See above

## 2012-10-13 NOTE — Progress Notes (Signed)
PCP: Kerby Nora, MD  Clinic Note: Chief Complaint  Patient presents with  . 8 month visit    no chest pain , no sob, no edema    HPI: Joshua Stark is a 67 y.o. male with a PMH below who presents today for six-month followup.  He he was a former patient of Dr. Julieanne Manson who I saw for the first time in December of last year.  He is a very pleasant gentleman history of CAD beginning back in 1999 when he underwent PCI of the LAD.  He really has not had them as they've any cardiac symptoms since then.  Interval History: He comes in today doing quite well any major complaints.  He denies any chest pain/pressure with or shortness of breath with rest or exertion.  He does get little bit dizzy and lightheaded if he gets overheated and may be dehydrated.  He also has a low dizzy if he were to bend over and sit up fast.  He otherwise denies any dizziness, wooziness, syncope or near-syncopal type symptoms.  No palpitations or rapid heartbeats.  No PND, orthopnea or edema. Additional review of systems: Melena - no, hematochezia no; hematuria - no; nosebleeds - no; claudication - no.  He does not that he had done really well having lost only had 250 pounds last visit, but just has not been "eating right" over last several months.  He continues to stay active and working with [employer] job. He was really trouble with plantar fasciitis in the last visit and he said that he started drinking cherry juice and since he's been doing that has been much improved.  Past Medical History  Diagnosis Date  . CAD S/P percutaneous coronary angioplasty 1999    PCI- prox LAD @ D1, SP1 trifurcation.  3.0 mm x 16 mm AVE-GFX BMS  . History of nuclear stress test August 2012    Treadmill Myoview: 9 minutes, 10 METS --> hypertensive response.  No evidence of ischemia or infarction.  EF 64%  . Hypertension   . HLD (hyperlipidemia)   . Obesity (BMI 30-39.9)   . Seizure disorder     Allergies  Allergen  Reactions  . Pravastatin Sodium     REACTION: cramps  . Rosuvastatin     REACTION: cramps  . Statins     Current Outpatient Prescriptions  Medication Sig Dispense Refill  . acetaminophen (TYLENOL) 500 MG tablet Take 500 mg by mouth as needed for pain.      Marland Kitchen amLODipine (NORVASC) 5 MG tablet Take 1 tablet (5 mg total) by mouth daily.  30 tablet  6  . aspirin 81 MG tablet Take 81 mg by mouth daily.        Marland Kitchen ezetimibe (ZETIA) 10 MG tablet Take 10 mg by mouth daily.        . fenofibrate (TRICOR) 145 MG tablet Take 145 mg by mouth daily.      . Garlic 300 MG TABS Take by mouth.      . Glucosamine-Chondroitin (OSTEO BI-FLEX REGULAR STRENGTH PO) Take by mouth.      Marland Kitchen lisinopril (PRINIVIL,ZESTRIL) 40 MG tablet Take 1 tablet (40 mg total) by mouth daily.  30 tablet  6  . Multiple Vitamin (MULTIVITAMIN) capsule Take 1 capsule by mouth daily.        Marland Kitchen OVER THE COUNTER MEDICATION Take 2 tablet cherry pill a day      . Red Yeast Rice Extract (RED YEAST RICE PO)  Take by mouth.      . Sennosides (SENOKOT PO) Take by mouth.      . DEPAKOTE 250 MG DR tablet TAKE 1 TABLET BY MOUTH TWICE A DAY  60 tablet  0  . fish oil-omega-3 fatty acids 1000 MG capsule Take 2 g by mouth daily.         No current facility-administered medications for this visit.    History   Social History Narrative   He is a married father of 2, grandfather 4.   He usually walks 5 days a week.   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  He does a lot of activity and strenuous labor with this as well.   He never smoked, and does not drink alcohol.   ROS: A comprehensive Review of Systems - Negative except Positives noted above.  Additional noncardiac symptoms below. General ROS: Weight gain Musculoskeletal ROS: He notes bilateral knee arthritis pains isn't limiting his activity.  PHYSICAL EXAM BP 120/60  Pulse 51  Ht 5\' 11"  (1.803 m)  Wt 268 lb 1.6 oz (121.609 kg)  BMI 37.41 kg/m2 General: A  & O x 3. Pleasant, relatively healthy-appearing, well groomed gentleman, NAD; moderately obese.   HEENT: NCAT. EOMI. MMM. Anicteric sclerae.  Neck: Supple with no LAN, JVD, or carotid bruit.  Heart: RRR. Normal S1, S2. No M/R/G. Nondisplaced PMI.  Lungs: CTA B., nonlabored, good air movement no W./R./R. Abdomen: Obese but otherwise soft/NT/ND/NABS. No HSM.  Extremities: No C/C/E. 2+ equal pulses throughout. No rashes or lesions.   MVH:QIONGEXBM today: Yes Rate: 51 , Rhythm: Sinus bradycardia, LVH, nonspecific ST-T. changes.  Cannot rule out lateral MI, age undetermined. -- No significant change.  Recent Labs: None since June of 2013.  He was supposed to have labs checked 3 months ago, but they were not checked.  June 2013: TC-1 80, TG 91, HDL 50, LDL 111.  Potassium 5.6, BUN/creatinine 40/1.5.  ASSESSMENT / PLAN: CAD S/P percutaneous coronary angioplasty She stable no active symptoms of angina.  He is on aspirin, and ACE inhibitor as well as and Zetia.  He is not on a statin, due to statin intolerance.  He is not beta blocker due to bradycardia.   No longer on a dual antiplatelet therapy due to the bare-metal stent over 15 years ago.  Hypertension Well-controlled on moderate dose ACE inhibitor. Continue current dosage.  CORONARY ARTERY DISEASE See above  HLD (hyperlipidemia) Last labs I have on him are from June of last year he says he has followup with his primary physician who checks his labs.  We will try to contact his PCPs office to get results.  If he is not have injected that time he comes back in January, we will go ahead and get it checked here for our records. His statin intolerance, having been on this but every single statin.  He is currently only on Zetia.  Based on his last lab would recommend TriCor - which I tried to start him on last time this year.  He never did get the prescription filled.  Obesity (BMI 30-39.9) He now is that he's had some dietary indiscretions  of late.  We went back over some preventative dietary modifications.  One consideration would be that he is referred to a dietitian.  For that reason, I like to see him more frequently than yearly.  I talked about dietary modification with decreased carbohydrate and fatty protein intake with increased vegetables  and fruits, lean proteins and cereals. Hopefully if his knee arthritis gets under control, he will able to get out and do more walking in order to burn calories.    Orders Placed This Encounter  Procedures  . EKG 12-Lead   Followup: With Corine Shelter, PA in January, with M.D. in one year   Fotios Amos W. Herbie Baltimore, M.D., M.S. [facility] [address] [place], [place]  [address]  [phone] Pager # [phone]

## 2012-10-13 NOTE — Assessment & Plan Note (Signed)
Well-controlled on moderate dose ACE inhibitor. Continue current dosage.

## 2012-10-24 ENCOUNTER — Telehealth: Payer: Self-pay | Admitting: Family Medicine

## 2012-10-24 DIAGNOSIS — R569 Unspecified convulsions: Secondary | ICD-10-CM

## 2012-10-24 DIAGNOSIS — Z125 Encounter for screening for malignant neoplasm of prostate: Secondary | ICD-10-CM

## 2012-10-24 DIAGNOSIS — E785 Hyperlipidemia, unspecified: Secondary | ICD-10-CM

## 2012-10-24 DIAGNOSIS — E291 Testicular hypofunction: Secondary | ICD-10-CM

## 2012-10-24 DIAGNOSIS — D649 Anemia, unspecified: Secondary | ICD-10-CM

## 2012-10-24 DIAGNOSIS — I10 Essential (primary) hypertension: Secondary | ICD-10-CM

## 2012-10-24 NOTE — Telephone Encounter (Signed)
Message copied by Excell Seltzer on Tue Oct 24, 2012  5:18 PM ------      Message from: Alvina Chou      Created: Tue Oct 17, 2012  4:34 PM      Regarding: Lab orders for Wednesday, 10.1.14       Patient is scheduled for CPX labs, please order future labs, Thanks , Terri       ------

## 2012-10-26 ENCOUNTER — Other Ambulatory Visit (INDEPENDENT_AMBULATORY_CARE_PROVIDER_SITE_OTHER): Payer: BC Managed Care – PPO

## 2012-10-26 DIAGNOSIS — R569 Unspecified convulsions: Secondary | ICD-10-CM

## 2012-10-26 DIAGNOSIS — E785 Hyperlipidemia, unspecified: Secondary | ICD-10-CM

## 2012-10-26 DIAGNOSIS — D649 Anemia, unspecified: Secondary | ICD-10-CM

## 2012-10-26 DIAGNOSIS — Z125 Encounter for screening for malignant neoplasm of prostate: Secondary | ICD-10-CM

## 2012-10-26 LAB — CBC WITH DIFFERENTIAL/PLATELET
Basophils Relative: 0.5 % (ref 0.0–3.0)
Eosinophils Relative: 3.1 % (ref 0.0–5.0)
Lymphocytes Relative: 57.3 % — ABNORMAL HIGH (ref 12.0–46.0)
Monocytes Relative: 5.7 % (ref 3.0–12.0)
Neutrophils Relative %: 33.4 % — ABNORMAL LOW (ref 43.0–77.0)
Platelets: 188 10*3/uL (ref 150.0–400.0)
RBC: 3.82 Mil/uL — ABNORMAL LOW (ref 4.22–5.81)
WBC: 4.1 10*3/uL — ABNORMAL LOW (ref 4.5–10.5)

## 2012-10-27 LAB — COMPREHENSIVE METABOLIC PANEL
CO2: 25 mEq/L (ref 19–32)
Calcium: 9.6 mg/dL (ref 8.4–10.5)
Chloride: 109 mEq/L (ref 96–112)
GFR: 51.77 mL/min — ABNORMAL LOW (ref >60.00)
Glucose, Bld: 146 mg/dL — ABNORMAL HIGH (ref 70–99)
Sodium: 140 mEq/L (ref 135–145)
Total Bilirubin: 0.6 mg/dL (ref 0.3–1.2)
Total Protein: 6.3 g/dL (ref 6.0–8.3)

## 2012-10-27 LAB — LIPID PANEL
Cholesterol: 170 mg/dL (ref 0–200)
VLDL: 10.8 mg/dL (ref 0.0–40.0)

## 2012-10-27 LAB — PSA, MEDICARE: PSA: 0.29 ng/ml (ref 0.10–4.00)

## 2012-10-31 ENCOUNTER — Ambulatory Visit (INDEPENDENT_AMBULATORY_CARE_PROVIDER_SITE_OTHER): Payer: BC Managed Care – PPO | Admitting: Family Medicine

## 2012-10-31 ENCOUNTER — Encounter: Payer: Self-pay | Admitting: Family Medicine

## 2012-10-31 VITALS — BP 162/66 | HR 56 | Temp 98.2°F | Ht 69.5 in | Wt 273.5 lb

## 2012-10-31 DIAGNOSIS — I10 Essential (primary) hypertension: Secondary | ICD-10-CM

## 2012-10-31 DIAGNOSIS — Z23 Encounter for immunization: Secondary | ICD-10-CM

## 2012-10-31 DIAGNOSIS — E785 Hyperlipidemia, unspecified: Secondary | ICD-10-CM

## 2012-10-31 DIAGNOSIS — R739 Hyperglycemia, unspecified: Secondary | ICD-10-CM

## 2012-10-31 DIAGNOSIS — Z0000 Encounter for general adult medical examination without abnormal findings: Secondary | ICD-10-CM

## 2012-10-31 DIAGNOSIS — R7309 Other abnormal glucose: Secondary | ICD-10-CM

## 2012-10-31 LAB — GLUCOSE, RANDOM: Glucose, Bld: 85 mg/dL (ref 70–99)

## 2012-10-31 NOTE — Assessment & Plan Note (Signed)
Elevateion today likely due to decongestant. Stop. Follow at home. Continue current meds.

## 2012-10-31 NOTE — Patient Instructions (Signed)
Stop at lab on way out. Increase red yeast rice to 600 mg two tabs twice a day. Can try coQ10 for muscle side effects. Follow up hyperlipidemia in 6 months with fasting lab prior.

## 2012-10-31 NOTE — Progress Notes (Signed)
The patient is here for annual wellness exam and preventative care.   Hypertension: Inadequate control on lisinopril and amlodipine BUT DID TAKE DECONGESTANT LAST NIGHT.Marland KitchenMarland KitchenNo hyperkalemia. BP at cardiology last month was good. BP Readings from Last 3 Encounters:  10/31/12 162/66  09/26/12 120/60  07/27/11 130/82  Using medication without problems or lightheadedness: None  Chest pain with exertion:None  Edema:None  Short of breath:None  Average home BPs: 130/80  Other issues:   Wt Readings from Last 3 Encounters:   01/07/11  277 lb 12 oz (125.987 kg)   11/26/10  276 lb 6.4 oz (125.374 kg)   08/04/10  265 lb 12.8 oz (120.566 kg)   Today wt 260   Elevated Cholesterol:Not at goal LDL <70, no longer on crestor a week given mucsle cramps.  On red yeast rice, zetia.  Cards recommended welchol. Lab Results  Component Value Date   CHOL 170 10/26/2012   HDL 53.80 10/26/2012   LDLCALC 105* 10/26/2012   TRIG 54.0 10/26/2012   CHOLHDL 3 10/26/2012   Using medications without problems:None  Muscle aches: None  Diet compliance:  Exercise: Plantar fasciitis improved with cherry juice, right knee limits some now..followed by Neila Gear and Dr. Charlsie Merles at [facility]. Has had two shots in foot.  Other complaints:   Hyperglycemia: glucose was 146 but he was not fasting.   Review of Systems  Constitutional: Negative for fever, fatigue and unexpected weight change.  HENT: Negative for ear pain, congestion, sore throat, rhinorrhea, trouble swallowing and postnasal drip.  Eyes: Negative for pain.  Respiratory: Negative for cough, shortness of breath and wheezing.  Cardiovascular: Negative for chest pain, palpitations and leg swelling.  Gastrointestinal: Positive for constipation. Negative for nausea, abdominal pain, diarrhea and blood in stool.  Genitourinary: Negative for dysuria, urgency, hematuria, discharge, penile swelling, scrotal swelling, difficulty urinating, penile pain and testicular pain.   Musculoskeletal: Positive for arthralgias.  Skin: Negative for rash.  Neurological: Negative for syncope, weakness, light-headedness, numbness and headaches.  Psychiatric/Behavioral: Negative for behavioral problems and dysphoric mood. The patient is not nervous/anxious.  Objective:   Physical Exam  Constitutional: He appears well-developed and well-nourished. Non-toxic appearance. He does not appear ill. No distress.  Obese, central obesity  HENT:  Head: Normocephalic and atraumatic.  Right Ear: Hearing, tympanic membrane, external ear and ear canal normal.  Left Ear: Hearing, tympanic membrane, external ear and ear canal normal.  Nose: Nose normal.  Mouth/Throat: Uvula is midline, oropharynx is clear and moist and mucous membranes are normal.  Eyes: Conjunctivae, EOM and lids are normal. Pupils are equal, round, and reactive to light. No foreign bodies found.  Neck: Trachea normal, normal range of motion and phonation normal. Neck supple. Carotid bruit is not present. No mass and no thyromegaly present.  Cardiovascular: Normal rate, regular rhythm, S1 normal, S2 normal, intact distal pulses and normal pulses. Exam reveals no gallop.  No murmur heard.  Pulmonary/Chest: Breath sounds normal. He has no wheezes. He has no rhonchi. He has no rales.  Abdominal: Soft. Normal appearance and bowel sounds are normal. There is no hepatosplenomegaly. There is no tenderness. There is no rebound, no guarding and no CVA tenderness. No hernia. Hernia confirmed negative in the right inguinal area and confirmed negative in the left inguinal area.  Genitourinary: Prostate normal, testes normal and penis normal. Rectal exam shows no external hemorrhoid, no internal hemorrhoid, no fissure, no mass, no tenderness and anal tone normal. Guaiac negative stool. Prostate is not enlarged and not  tender. Right testis shows no mass and no tenderness. Left testis shows no mass and no tenderness. No paraphimosis or penile  tenderness.  Lymphadenopathy:  He has no cervical adenopathy.  Right: No inguinal adenopathy present.  Left: No inguinal adenopathy present.  Neurological: He is alert. He has normal strength and normal reflexes. No cranial nerve deficit or sensory deficit. Gait normal.  Skin: Skin is warm, dry and intact. No rash noted.  Psychiatric: He has a normal mood and affect. His speech is normal and behavior is normal. Judgment normal.  Assessment & Plan:   The patient's preventative maintenance and recommended screening tests for an annual wellness exam were reviewed in full today.  Brought up to date unless services declined.  Counselled on the importance of diet, exercise, and its role in overall health and mortality.  The patient's FH and SH was reviewed, including their home life, tobacco status, and drug and alcohol status.   Lab Results  Component Value Date   PSA 0.29 10/26/2012   PSA 0.34 07/20/2011   PSA 0.32 08/03/2010   Colon: ifob 2011, colonoscopy 2006 neg, repeat in 2016.   Vaccines: Uptodate with TD, not interested in shingles vaccine.  Refused pneumonia vaccine. Flu given. Nonsmoker.

## 2012-10-31 NOTE — Assessment & Plan Note (Signed)
LDL not at goal < 70 with history of CAD. Pt refuses tricor/welchol at this time despite increased risk of repeat MI.  Recommended at least increasing red yeast rice to max and continuing zetia... If SE can try coQ 10.

## 2012-10-31 NOTE — Addendum Note (Signed)
Addended by: Liane Comber C on: 10/31/2012 12:46 PM   Modules accepted: Orders

## 2012-11-08 ENCOUNTER — Other Ambulatory Visit: Payer: Self-pay | Admitting: Family Medicine

## 2012-11-08 NOTE — Telephone Encounter (Signed)
Last office visit 10/31/2012.  Ok to refill? 

## 2013-01-22 ENCOUNTER — Other Ambulatory Visit: Payer: Self-pay | Admitting: *Deleted

## 2013-01-22 MED ORDER — FENOFIBRATE 145 MG PO TABS: 145.0000 mg | ORAL_TABLET | Freq: Every day | ORAL | Status: DC

## 2013-01-22 MED ORDER — EZETIMIBE 10 MG PO TABS: 10.0000 mg | ORAL_TABLET | Freq: Every day | ORAL | Status: DC

## 2013-03-13 ENCOUNTER — Ambulatory Visit: Payer: Self-pay | Admitting: Cardiology

## 2013-03-22 ENCOUNTER — Ambulatory Visit: Payer: Self-pay | Admitting: Physician Assistant

## 2013-03-26 ENCOUNTER — Ambulatory Visit (INDEPENDENT_AMBULATORY_CARE_PROVIDER_SITE_OTHER): Payer: BC Managed Care – PPO | Admitting: Physician Assistant

## 2013-03-26 ENCOUNTER — Encounter: Payer: Self-pay | Admitting: Physician Assistant

## 2013-03-26 VITALS — BP 128/72 | HR 48 | Ht 71.0 in | Wt 261.0 lb

## 2013-03-26 DIAGNOSIS — I10 Essential (primary) hypertension: Secondary | ICD-10-CM

## 2013-03-26 DIAGNOSIS — E669 Obesity, unspecified: Secondary | ICD-10-CM

## 2013-03-26 DIAGNOSIS — I2510 Atherosclerotic heart disease of native coronary artery without angina pectoris: Secondary | ICD-10-CM

## 2013-03-26 NOTE — Progress Notes (Signed)
Date:  03/26/2013   ID:  Joshua Stark, DOB [birth year], MRN [id number]  PCP:  Eliezer Lofts, MD  Primary Cardiologist:  Ellyn Hack    History of Present Illness: Joshua Stark is a 68 y.o. male with a PMH below who presents today for six-month followup. He is a patient of Dr. Ellyn Hack.  He is a very pleasant gentleman with a history of CAD beginning back in 1999 when he underwent PCI of the LAD.  The patient was last seen by Dr. Ellyn Hack discussion dietary modification to achieve weight loss.  Based on her reported weights patient has lost 12 pounds since 10/31/2012.  He presents today for evaluation.  Reports doing quite well with no complaints. He's been trying hard in the area of dietary modification weight loss and it shows.  The patient currently denies nausea, vomiting, fever, chest pain, shortness of breath, orthopnea, dizziness, PND, cough, congestion, abdominal pain, hematochezia, melena, lower extremity edema, claudication.  Wt Readings from Last 3 Encounters:  03/26/13 261 lb (118.389 kg)  10/31/12 273 lb 8 oz (124.059 kg)  09/26/12 268 lb 1.6 oz (121.609 kg)     Past Medical History  Diagnosis Date  . CAD S/P percutaneous coronary angioplasty 1999    PCI- prox LAD @ D1, SP1 trifurcation.  3.0 mm x 16 mm AVE-GFX BMS  . History of nuclear stress test August 2012    Treadmill Myoview: 9 minutes, 10 METS --> hypertensive response.  No evidence of ischemia or infarction.  EF 64%  . Hypertension   . HLD (hyperlipidemia)   . Obesity (BMI 30-39.9)   . Seizure disorder     Current Outpatient Prescriptions  Medication Sig Dispense Refill  . acetaminophen (TYLENOL) 500 MG tablet Take 500 mg by mouth as needed for pain.      Marland Kitchen amLODipine (NORVASC) 5 MG tablet Take 1 tablet (5 mg total) by mouth daily.  30 tablet  6  . aspirin 81 MG tablet Take 81 mg by mouth daily.        Marland Kitchen DEPAKOTE 250 MG DR tablet TAKE 1 TABLET BY MOUTH TWICE A DAY  60 tablet  11  . ezetimibe (ZETIA) 10 MG tablet  Take 1 tablet (10 mg total) by mouth daily.  30 tablet  6  . fenofibrate (TRICOR) 145 MG tablet Take 1 tablet (145 mg total) by mouth daily.  30 tablet  6  . fish oil-omega-3 fatty acids 1000 MG capsule Take 2 g by mouth daily.        . Garlic 741 MG TABS Take by mouth.      . Glucosamine-Chondroitin (OSTEO BI-FLEX REGULAR STRENGTH PO) Take by mouth.      Marland Kitchen lisinopril (PRINIVIL,ZESTRIL) 40 MG tablet Take 1 tablet (40 mg total) by mouth daily.  30 tablet  6  . Multiple Vitamin (MULTIVITAMIN) capsule Take 1 capsule by mouth daily.        Marland Kitchen OVER THE COUNTER MEDICATION Take 2 tablet cherry pill a day      . Red Yeast Rice Extract (RED YEAST RICE PO) Take by mouth.      . Sennosides (SENOKOT PO) Take by mouth.       No current facility-administered medications for this visit.    Allergies:    Allergies  Allergen Reactions  . Pravastatin Sodium     REACTION: cramps  . Rosuvastatin     REACTION: cramps  . Statins     Social History:  The patient  reports that he has never smoked. He has never used smokeless tobacco. He reports that he does not drink alcohol or use illicit drugs.   Family history:   Family History  Problem Relation Age of Onset  . Hypertension Mother   . Cancer Father     lung and prostate    ROS:  Please see the history of present illness.  All other systems reviewed and negative.   PHYSICAL EXAM: VS:  BP 128/72  Pulse 48  Ht 5\' 11"  (1.803 m)  Wt 261 lb (118.389 kg)  BMI 36.42 kg/m2 Obese, well developed, in no acute distress HEENT: Pupils are equal round react to light accommodation extraocular movements are intact.  Neck: no JVDNo cervical lymphadenopathy. Cardiac: slow rate and Regular rhythm without murmurs rubs or gallops. Lungs:  clear to auscultation bilaterally, no wheezing, rhonchi or rales Abd: soft, nontender, positive bowel sounds all quadrants,  Ext: no lower extremity edema.  2+ radial and dorsalis pedis pulses. Skin: warm and dry Neuro:   Grossly normal  EKG:  Sinus bradycardia rate of 48 beats per minute no change from prior  ASSESSMENT AND PLAN:  Problem List Items Addressed This Visit   CORONARY ARTERY DISEASE - Primary (Chronic)     No anginal symptoms in the last 4 months.    Hypertension (Chronic)     Blood pressure currently well-controlled on current medical regimen    Obesity (BMI 30-39.9) (Chronic)     We discussed adding more exercise in the form of cardiovascular and weights. I also offered him a dietary consult to further assist and offer additional resources for weight loss.

## 2013-03-26 NOTE — Assessment & Plan Note (Signed)
We discussed adding more exercise in the form of cardiovascular and weights. I also offered him a dietary consult to further assist and offer additional resources for weight loss.

## 2013-03-26 NOTE — Assessment & Plan Note (Signed)
Blood pressure currently well-controlled on current medical regimen

## 2013-03-26 NOTE — Patient Instructions (Signed)
1.  Keep up the great work with weight loss. My recommendation is to do exercise with cardiovascular and weights. 2.  Follow up with Dr. Ellyn Hack in 6 months.

## 2013-03-26 NOTE — Assessment & Plan Note (Signed)
No anginal symptoms in the last 4 months.

## 2013-04-23 ENCOUNTER — Telehealth: Payer: Self-pay | Admitting: Family Medicine

## 2013-04-23 DIAGNOSIS — E785 Hyperlipidemia, unspecified: Secondary | ICD-10-CM

## 2013-04-23 DIAGNOSIS — D649 Anemia, unspecified: Secondary | ICD-10-CM

## 2013-04-23 NOTE — Telephone Encounter (Signed)
Message copied by Jinny Sanders on Mon Apr 23, 2013 11:25 PM ------      Message from: Ellamae Sia      Created: Mon Apr 16, 2013 12:35 PM      Regarding: Lab orders for Tuesday, 3.31.15       Lab orders for a 6 month f/u ------

## 2013-04-24 ENCOUNTER — Other Ambulatory Visit (INDEPENDENT_AMBULATORY_CARE_PROVIDER_SITE_OTHER): Payer: BC Managed Care – PPO

## 2013-04-24 DIAGNOSIS — E785 Hyperlipidemia, unspecified: Secondary | ICD-10-CM

## 2013-04-24 DIAGNOSIS — D649 Anemia, unspecified: Secondary | ICD-10-CM

## 2013-04-24 DIAGNOSIS — I10 Essential (primary) hypertension: Secondary | ICD-10-CM

## 2013-04-24 LAB — CBC WITH DIFFERENTIAL/PLATELET
Basophils Absolute: 0 10*3/uL (ref 0.0–0.1)
Basophils Relative: 0.8 % (ref 0.0–3.0)
EOS PCT: 2.3 % (ref 0.0–5.0)
Eosinophils Absolute: 0.1 10*3/uL (ref 0.0–0.7)
HCT: 37.3 % — ABNORMAL LOW (ref 39.0–52.0)
Hemoglobin: 12.3 g/dL — ABNORMAL LOW (ref 13.0–17.0)
LYMPHS PCT: 55.2 % — AB (ref 12.0–46.0)
Lymphs Abs: 2.3 10*3/uL (ref 0.7–4.0)
MCHC: 32.9 g/dL (ref 30.0–36.0)
MCV: 92 fl (ref 78.0–100.0)
MONO ABS: 0.3 10*3/uL (ref 0.1–1.0)
MONOS PCT: 8.1 % (ref 3.0–12.0)
NEUTROS PCT: 33.6 % — AB (ref 43.0–77.0)
Neutro Abs: 1.4 10*3/uL (ref 1.4–7.7)
PLATELETS: 212 10*3/uL (ref 150.0–400.0)
RBC: 4.05 Mil/uL — ABNORMAL LOW (ref 4.22–5.81)
RDW: 13.5 % (ref 11.5–14.6)
WBC: 4.1 10*3/uL — AB (ref 4.5–10.5)

## 2013-04-24 LAB — COMPREHENSIVE METABOLIC PANEL
ALBUMIN: 4 g/dL (ref 3.5–5.2)
ALK PHOS: 40 U/L (ref 39–117)
ALT: 22 U/L (ref 0–53)
AST: 26 U/L (ref 0–37)
BUN: 31 mg/dL — ABNORMAL HIGH (ref 6–23)
CO2: 28 mEq/L (ref 19–32)
Calcium: 9.6 mg/dL (ref 8.4–10.5)
Chloride: 108 mEq/L (ref 96–112)
Creatinine, Ser: 1.5 mg/dL (ref 0.4–1.5)
GFR: 51.69 mL/min — ABNORMAL LOW (ref >60.00)
GLUCOSE: 102 mg/dL — AB (ref 70–99)
POTASSIUM: 4.9 meq/L (ref 3.5–5.1)
SODIUM: 141 meq/L (ref 135–145)
TOTAL PROTEIN: 6.5 g/dL (ref 6.0–8.3)
Total Bilirubin: 0.5 mg/dL (ref 0.3–1.2)

## 2013-04-24 LAB — LIPID PANEL
CHOL/HDL RATIO: 3
CHOLESTEROL: 149 mg/dL (ref 0–200)
HDL: 51.8 mg/dL (ref >39.00)
LDL CALC: 88 mg/dL (ref 0–99)
Triglycerides: 44 mg/dL (ref 0.0–149.0)
VLDL: 8.8 mg/dL (ref 0.0–40.0)

## 2013-05-01 ENCOUNTER — Encounter: Payer: Self-pay | Admitting: Family Medicine

## 2013-05-01 ENCOUNTER — Ambulatory Visit (INDEPENDENT_AMBULATORY_CARE_PROVIDER_SITE_OTHER): Payer: BC Managed Care – PPO | Admitting: Family Medicine

## 2013-05-01 ENCOUNTER — Ambulatory Visit: Payer: BC Managed Care – PPO | Admitting: Family Medicine

## 2013-05-01 VITALS — BP 140/70 | HR 51 | Temp 97.9°F | Ht 71.0 in | Wt 254.5 lb

## 2013-05-01 DIAGNOSIS — E785 Hyperlipidemia, unspecified: Secondary | ICD-10-CM

## 2013-05-01 DIAGNOSIS — I10 Essential (primary) hypertension: Secondary | ICD-10-CM

## 2013-05-01 DIAGNOSIS — N183 Chronic kidney disease, stage 3 unspecified: Secondary | ICD-10-CM

## 2013-05-01 DIAGNOSIS — D649 Anemia, unspecified: Secondary | ICD-10-CM

## 2013-05-01 DIAGNOSIS — I2510 Atherosclerotic heart disease of native coronary artery without angina pectoris: Secondary | ICD-10-CM

## 2013-05-01 DIAGNOSIS — N182 Chronic kidney disease, stage 2 (mild): Secondary | ICD-10-CM | POA: Insufficient documentation

## 2013-05-01 DIAGNOSIS — D72819 Decreased white blood cell count, unspecified: Secondary | ICD-10-CM

## 2013-05-01 NOTE — Progress Notes (Signed)
Pre visit review using our clinic review tool, if applicable. No additional management support is needed unless otherwise documented below in the visit note. 

## 2013-05-01 NOTE — Assessment & Plan Note (Signed)
Improving control on red yeast rice and zetia with weight loss.  Encouraged exercise, weight loss, healthy eating habits.

## 2013-05-01 NOTE — Assessment & Plan Note (Signed)
Stable, discussed meds to avoid.

## 2013-05-01 NOTE — Progress Notes (Signed)
   Subjective:    Patient ID: Joshua Stark, male    DOB: [birth year], 68 y.o.   MRN: [id number]  HPI  68 year old male presents for 6 month follow up.   Hypertension:  Improved control on current regimen. BP Readings from Last 3 Encounters:  05/01/13 140/70  03/26/13 128/72  10/31/12 162/66   Using medication without problems or lightheadedness: None Chest pain with exertion:None Edema:NoneNone Short of breath: Average home BPs: < 140/90. Eating better overall. Other issues: Continued weight loss with lifestyle changes. Wt Readings from Last 3 Encounters:  05/01/13 254 lb 8 oz (115.44 kg)  03/26/13 261 lb (118.389 kg)  10/31/12 273 lb 8 oz (124.059 kg)     Elevated Cholesterol: At last OV: LDL not at goal < 70 with history of CAD.  Pt refused tricor/welchol  despite increased risk of repeat MI.  On  red yeast rice to max and continuing zetia... If SE can try coQ 10. Today he reports:  His cholesterol is much improved and almost at goal on current regimen of red yeast rice and lifestyle changes, weight loss! Lab Results  Component Value Date   CHOL 149 04/24/2013   HDL 51.80 04/24/2013   LDLCALC 88 04/24/2013   TRIG 44.0 04/24/2013   CHOLHDL 3 04/24/2013  Using medications without problems:None Muscle aches: None Diet compliance: improved Exercise: walking Other complaints:   Prediabetes: improving as well.   CKD, stage 3 stable.   Mild leukopenia... Increased lymphocytes and decreased neutrophils. Persistent in last 6 months.  Mild anemia.  Viral infection 3-4 weeks ago.  Review of Systems  Constitutional: Negative for fever and fatigue.  HENT: Negative for ear pain.   Eyes: Negative for pain.  Respiratory: Negative for shortness of breath.   Cardiovascular: Negative for chest pain and leg swelling.  Gastrointestinal: Negative for abdominal pain.       Objective:   Physical Exam  Constitutional: Vital signs are normal. He appears well-developed and  well-nourished.  Overweight.   HENT:  Head: Normocephalic.  Right Ear: Hearing normal.  Left Ear: Hearing normal.  Nose: Nose normal.  Mouth/Throat: Oropharynx is clear and moist and mucous membranes are normal.  Neck: Trachea normal. Carotid bruit is not present. No mass and no thyromegaly present.  Cardiovascular: Normal rate, regular rhythm and normal pulses.  Exam reveals no gallop, no distant heart sounds and no friction rub.   No murmur heard. No peripheral edema  Pulmonary/Chest: Effort normal and breath sounds normal. No respiratory distress.  Skin: Skin is warm, dry and intact. No rash noted.  Psychiatric: He has a normal mood and affect. His speech is normal and behavior is normal. Thought content normal.         Assessment & Plan:

## 2013-05-01 NOTE — Patient Instructions (Addendum)
Return for labs  Only to re-evaluate blood cell counts in 1 month. Keep up the good work!  Schedule medicare wellness in 6 months with labs prior.

## 2013-05-01 NOTE — Assessment & Plan Note (Signed)
Eval iron at next OV,  Anemia is likely multifactorial... At least partially due to CKD.

## 2013-05-01 NOTE — Assessment & Plan Note (Signed)
Slight abnormality, but persistent.  Re-eval in 1 month.  If continues.. Refer to heme.

## 2013-05-01 NOTE — Assessment & Plan Note (Signed)
Well controlled. Continue current medication.  

## 2013-05-02 ENCOUNTER — Telehealth: Payer: Self-pay | Admitting: Family Medicine

## 2013-05-02 NOTE — Telephone Encounter (Signed)
Relevant patient education assigned to patient using Emmi. ° °

## 2013-05-31 ENCOUNTER — Other Ambulatory Visit (INDEPENDENT_AMBULATORY_CARE_PROVIDER_SITE_OTHER): Payer: BC Managed Care – PPO

## 2013-05-31 ENCOUNTER — Encounter: Payer: Self-pay | Admitting: Family Medicine

## 2013-05-31 DIAGNOSIS — D72819 Decreased white blood cell count, unspecified: Secondary | ICD-10-CM

## 2013-05-31 DIAGNOSIS — D649 Anemia, unspecified: Secondary | ICD-10-CM

## 2013-05-31 LAB — CBC WITH DIFFERENTIAL/PLATELET
BASOS PCT: 0.8 % (ref 0.0–3.0)
Basophils Absolute: 0 10*3/uL (ref 0.0–0.1)
Eosinophils Absolute: 0.1 10*3/uL (ref 0.0–0.7)
Eosinophils Relative: 3 % (ref 0.0–5.0)
HCT: 38.2 % — ABNORMAL LOW (ref 39.0–52.0)
HEMOGLOBIN: 12.7 g/dL — AB (ref 13.0–17.0)
LYMPHS PCT: 47.4 % — AB (ref 12.0–46.0)
Lymphs Abs: 2.1 10*3/uL (ref 0.7–4.0)
MCHC: 33.2 g/dL (ref 30.0–36.0)
MCV: 91.7 fl (ref 78.0–100.0)
MONOS PCT: 7.5 % (ref 3.0–12.0)
Monocytes Absolute: 0.3 10*3/uL (ref 0.1–1.0)
NEUTROS ABS: 1.8 10*3/uL (ref 1.4–7.7)
Neutrophils Relative %: 41.3 % — ABNORMAL LOW (ref 43.0–77.0)
Platelets: 227 10*3/uL (ref 150.0–400.0)
RBC: 4.17 Mil/uL — ABNORMAL LOW (ref 4.22–5.81)
RDW: 13.8 % (ref 11.5–15.5)
WBC: 4.4 10*3/uL (ref 4.0–10.5)

## 2013-05-31 LAB — IBC PANEL
Iron: 87 ug/dL (ref 42–165)
SATURATION RATIOS: 21.2 % (ref 20.0–50.0)
Transferrin: 293.7 mg/dL (ref 212.0–360.0)

## 2013-05-31 LAB — FERRITIN: FERRITIN: 222.9 ng/mL (ref 22.0–322.0)

## 2013-06-26 ENCOUNTER — Other Ambulatory Visit: Payer: Self-pay

## 2013-06-26 MED ORDER — LISINOPRIL 40 MG PO TABS: 40.0000 mg | ORAL_TABLET | Freq: Every day | ORAL | Status: DC

## 2013-06-26 NOTE — Telephone Encounter (Signed)
Rx was sent to pharmacy electronically. 

## 2013-08-08 ENCOUNTER — Other Ambulatory Visit: Payer: Self-pay | Admitting: *Deleted

## 2013-08-08 MED ORDER — DEPAKOTE 250 MG PO TBEC: DELAYED_RELEASE_TABLET | ORAL | Status: DC

## 2013-09-18 ENCOUNTER — Encounter: Payer: Self-pay | Admitting: Cardiology

## 2013-09-18 ENCOUNTER — Ambulatory Visit (INDEPENDENT_AMBULATORY_CARE_PROVIDER_SITE_OTHER): Payer: BC Managed Care – PPO | Admitting: Cardiology

## 2013-09-18 VITALS — BP 128/60 | HR 57 | Ht 71.0 in | Wt 260.7 lb

## 2013-09-18 DIAGNOSIS — Z9861 Coronary angioplasty status: Secondary | ICD-10-CM

## 2013-09-18 DIAGNOSIS — I351 Nonrheumatic aortic (valve) insufficiency: Secondary | ICD-10-CM | POA: Insufficient documentation

## 2013-09-18 DIAGNOSIS — E785 Hyperlipidemia, unspecified: Secondary | ICD-10-CM

## 2013-09-18 DIAGNOSIS — E669 Obesity, unspecified: Secondary | ICD-10-CM

## 2013-09-18 DIAGNOSIS — I10 Essential (primary) hypertension: Secondary | ICD-10-CM

## 2013-09-18 DIAGNOSIS — I359 Nonrheumatic aortic valve disorder, unspecified: Secondary | ICD-10-CM

## 2013-09-18 DIAGNOSIS — I2510 Atherosclerotic heart disease of native coronary artery without angina pectoris: Secondary | ICD-10-CM

## 2013-09-18 DIAGNOSIS — R1032 Left lower quadrant pain: Secondary | ICD-10-CM

## 2013-09-18 NOTE — Progress Notes (Signed)
PCP: Eliezer Lofts, MD  Clinic Note: Chief Complaint  Patient presents with  . 82month visit    no chest pain ,no sob ,nno edema    HPI: Joshua Stark is a 68 y.o. male with a Cardiovascular Problem List below who presents today for bi-annual followup of single vessel CAD. He saw Tenny Craw, PA-C. back in March and was doing very well.  Interval History: He presents today without any major problems. He says he tries to walk as much she can but is really limited by this time with his job. He denies any anginal discomfort with rest or exertion. No gastrointestinal dyspnea. He has some leg cramping at night but no claudication. He denies any PND, orthopnea or edema.  No palpitations, lightheadedness, dizziness, weakness or syncope/near syncope. No TIA/amaurosis fugax symptoms. No melena, hematochezia, hematuria, or epstaxis. No claudication.   Past Medical History  Diagnosis Date  . CAD S/P percutaneous coronary angioplasty 1999    PCI- prox LAD @ D1, SP1 trifurcation.  3.0 mm x 16 mm AVE-GFX BMS  . History of nuclear stress test August 2012    Treadmill Myoview: 9 minutes, 10 METS --> hypertensive response.  No evidence of ischemia or infarction.  EF 64%  . Hypertension   . HLD (hyperlipidemia)   . Obesity (BMI 30-39.9)   . Seizure disorder     Prior Cardiac Evaluation and Past Surgical History: Past Surgical History  Procedure Laterality Date  . Nm myocar perf wall motion  Aug 2 ,2012    treadmill myoview exercised 9 minutes, reacing 10 metaboblic equivlents,had ahypertensive reponse .EF64%  . Cardiac catheterization  01/20/1998  . Coronary angioplasty  01/20/1998    PCI to LAD ,prox LAD at trifurcation for D1and SP1.3.0-x16-mm AVE-GFX bare-metal stent    MEDICATIONS AND ALLERGIES REVIEWED IN EPIC No Change in Social and Family History  ROS: A comprehensive Review of Systems - was performed Review of Systems  Constitutional: Negative for fever, chills and  malaise/fatigue.       Actually weight gain post loss  HENT: Negative for nosebleeds.   Cardiovascular: Negative.        Per history of present illness  Gastrointestinal: Negative for heartburn, nausea, constipation, blood in stool and melena.  Genitourinary: Negative for frequency and hematuria.  Musculoskeletal: Negative for myalgias.  Neurological: Negative.  Negative for weakness and headaches.       Per history of present illness  Endo/Heme/Allergies: Does not bruise/bleed easily.  All other systems reviewed and are negative.  Wt Readings from Last 3 Encounters:  09/18/13 260 lb 11.2 oz (118.253 kg)  05/01/13 254 lb 8 oz (115.44 kg)  03/26/13 261 lb (118.389 kg)   PHYSICAL EXAM BP 128/60  Pulse 57  Ht 5\' 11"  (1.803 m)  Wt 260 lb 11.2 oz (118.253 kg)  BMI 36.38 kg/m2 General: A & O x 3. Pleasant, relatively healthy-appearing, well groomed gentleman, NAD; moderately obese.  HEENT: NCAT. EOMI. MMM. Anicteric sclerae.  Neck: Supple with no LAN, JVD, or carotid bruit.  Heart: RRR. Normal S1, S2. 1-2/6 SEM @ RUSB ->carotids M/R/G. Nondisplaced PMI.  Lungs: CTA B., nonlabored, good air movement no W./R./R.  Abdomen: Obese but otherwise soft/NT/ND/NABS. No HSM.  Extremities: No C/C/E. 2+ equal pulses throughout. No rashes or lesions.    Adult ECG Report  Rate: 57 ;  Rhythm: sinus bradycardia - LVH with repolarization otherwise normal.  Recent Labs from March 2015:   Total cholesterol 149, triglycerides 44, HDL 51,  LDL 88 - checked by PCP  ASSESSMENT / PLAN: CAD S/P percutaneous coronary angioplasty No active symptoms of angina. He is not as active as I like for him to be. We discussed the importance of getting an exercise he doesn't gain weight he previously lost. He is on aspirin and ACE inhibitor as well as calcium channel blocker but not beta blocker due to bradycardia. Is not on a statin due to intolerance but his lipids are relatively well controlled.  Aortic ejection  murmur This is actually something that he mentioned that I told him last visit, but I do not recorded on the exam. He has not had any type of evaluation of his functioning quite some time and has not had an echocardiogram for his far back as I can see.   Plan: 2-D echocardiogram to assess aortic murmur and a baseline EF  Essential hypertension Well controlled. Continue current medication  Hyperlipidemia with target LDL less than 70 There was definitely significant improvement from October to March with improved eating habits (however he has gained weight since then) and he said he plus fenofibrate. He is also using red yeast rice supplement.  He should be having his primary physician -- although improved, not yet at goal. Once he does get close to the 70 LDL range, it would be nice to get a NMR panel to confirm screening tests.  Obesity (BMI 30-39.9) He can he is to pick back up the exercise routine as much as possible. He also needs to get back into his and better dietary habits. He has declined diet counseling from a nutritionist in the past.    Orders Placed This Encounter  Procedures  . EKG 12-Lead  . 2D Echocardiogram without contrast    Standing Status: Future     Number of Occurrences:      Standing Expiration Date: 09/18/2014    Order Specific Question:  Type of Echo    Answer:  Complete    Order Specific Question:  Where should this test be performed    Answer:  [facility]    Order Specific Question:  Reason for exam-Echo    Answer:  Murmur  785.2   No orders of the defined types were placed in this encounter.    Followup: 6 months with Tenny Craw, PA-C. and 12 months with Dr. Leonie Man. Ellyn Hack, M.D., M.S. Interventional Cardiologist [facility]

## 2013-09-18 NOTE — Patient Instructions (Signed)
Your physician has requested that you have an echocardiogram. Echocardiography is a painless test that uses sound waves to create images of your heart. It provides your doctor with information about the size and shape of your heart and how well your heart's chambers and valves are working. This procedure takes approximately one hour. There are no restrictions for this procedure.  Will call with results  Your physician recommends that you schedule a follow-up appointment in 62 month with [facility]  Your physician wants you to follow-up in 89 MONTH Dr Ellyn Hack. You will receive a reminder letter in the mail two months in advance. If you don't receive a letter, please call our office to schedule the follow-up appointment.

## 2013-09-19 ENCOUNTER — Telehealth: Payer: Self-pay | Admitting: Cardiology

## 2013-09-19 ENCOUNTER — Encounter: Payer: Self-pay | Admitting: Cardiology

## 2013-09-19 NOTE — Assessment & Plan Note (Signed)
No active symptoms of angina. He is not as active as I like for him to be. We discussed the importance of getting an exercise he doesn't gain weight he previously lost. He is on aspirin and ACE inhibitor as well as calcium channel blocker but not beta blocker due to bradycardia. Is not on a statin due to intolerance but his lipids are relatively well controlled.

## 2013-09-19 NOTE — Telephone Encounter (Signed)
PATIENT MEDICATION LIST IS CORRECT FORM APPOINTMENT 0N 09/18/13

## 2013-09-19 NOTE — Assessment & Plan Note (Signed)
He can he is to pick back up the exercise routine as much as possible. He also needs to get back into his and better dietary habits. He has declined diet counseling from a nutritionist in the past.

## 2013-09-19 NOTE — Telephone Encounter (Signed)
He wanted you to know he take 40 mg of Lisinopril,not 20 mg.

## 2013-09-19 NOTE — Assessment & Plan Note (Signed)
This is actually something that he mentioned that I told him last visit, but I do not recorded on the exam. He has not had any type of evaluation of his functioning quite some time and has not had an echocardiogram for his far back as I can see.   Plan: 2-D echocardiogram to assess aortic murmur and a baseline EF

## 2013-09-19 NOTE — Assessment & Plan Note (Signed)
Well controlled. Continue current medication.  

## 2013-09-19 NOTE — Assessment & Plan Note (Signed)
There was definitely significant improvement from October to March with improved eating habits (however he has gained weight since then) and he said he plus fenofibrate. He is also using red yeast rice supplement.  He should be having his primary physician -- although improved, not yet at goal. Once he does get close to the 70 LDL range, it would be nice to get a NMR panel to confirm screening tests.

## 2013-09-20 ENCOUNTER — Ambulatory Visit (HOSPITAL_COMMUNITY)
Admission: RE | Admit: 2013-09-20 | Discharge: 2013-09-20 | Disposition: A | Discharge status: Home / Self Care | Payer: BC Managed Care – PPO | Source: Ambulatory Visit | Attending: Cardiovascular Disease | Admitting: Cardiovascular Disease

## 2013-09-20 DIAGNOSIS — E785 Hyperlipidemia, unspecified: Secondary | ICD-10-CM | POA: Insufficient documentation

## 2013-09-20 DIAGNOSIS — I2510 Atherosclerotic heart disease of native coronary artery without angina pectoris: Secondary | ICD-10-CM | POA: Insufficient documentation

## 2013-09-20 DIAGNOSIS — I359 Nonrheumatic aortic valve disorder, unspecified: Secondary | ICD-10-CM | POA: Diagnosis present

## 2013-09-20 DIAGNOSIS — I351 Nonrheumatic aortic (valve) insufficiency: Secondary | ICD-10-CM

## 2013-09-20 DIAGNOSIS — I379 Nonrheumatic pulmonary valve disorder, unspecified: Secondary | ICD-10-CM | POA: Diagnosis not present

## 2013-09-20 DIAGNOSIS — I079 Rheumatic tricuspid valve disease, unspecified: Secondary | ICD-10-CM | POA: Diagnosis not present

## 2013-09-20 DIAGNOSIS — R011 Cardiac murmur, unspecified: Secondary | ICD-10-CM

## 2013-09-20 HISTORY — PX: TRANSTHORACIC ECHOCARDIOGRAM: SHX275

## 2013-09-20 NOTE — Progress Notes (Signed)
2D Echo Performed 09/20/2013    Aryannah Mohon, RCS  

## 2013-09-20 NOTE — Progress Notes (Signed)
Quick Note:  Echo results: Good news: Essentially normal echocardiogram and normal pump function and normal valve function. No signs to suggest heart attack.. EF: 60-65%. Aortic valve is a bit calcified with mild back leak -- benign, but explains the murmur. No regional wall motion abnormalities  Joshua Stark, M.D., M.S. Interventional Cardiologist   Pager # [phone] 09/20/2013  ______

## 2013-09-26 ENCOUNTER — Telehealth: Payer: Self-pay | Admitting: *Deleted

## 2013-09-26 NOTE — Telephone Encounter (Signed)
Left message to call back  

## 2013-09-26 NOTE — Telephone Encounter (Signed)
Spoke to patient. Result given . Verbalized understanding  

## 2013-09-26 NOTE — Telephone Encounter (Signed)
Message copied by Raiford Simmonds on Wed Sep 26, 2013  8:30 AM ------      Message from: Ellyn Hack, DAVID W      Created: Thu Sep 20, 2013  5:46 PM       Echo results:      Good news: Essentially normal echocardiogram and normal pump function and normal valve function.  No signs to suggest heart attack..      EF: 60-65%.      Aortic valve is a bit calcified with mild back leak -- benign, but explains the murmur.      No regional wall motion abnormalities            Leonie Man, M.D., M.S.      Interventional Cardiologist             Pager # [phone]      09/20/2013       ------

## 2013-10-24 ENCOUNTER — Telehealth: Payer: Self-pay | Admitting: Family Medicine

## 2013-10-24 DIAGNOSIS — D72819 Decreased white blood cell count, unspecified: Secondary | ICD-10-CM

## 2013-10-24 DIAGNOSIS — E785 Hyperlipidemia, unspecified: Secondary | ICD-10-CM

## 2013-10-24 DIAGNOSIS — Z125 Encounter for screening for malignant neoplasm of prostate: Secondary | ICD-10-CM

## 2013-10-24 DIAGNOSIS — D638 Anemia in other chronic diseases classified elsewhere: Secondary | ICD-10-CM

## 2013-10-24 NOTE — Telephone Encounter (Signed)
Message copied by Jinny Sanders on Wed Oct 24, 2013 11:12 PM ------      Message from: Ellamae Sia      Created: Thu Oct 18, 2013 12:46 PM      Regarding: Lab orders for [facility], 10.1.15       Patient is scheduled for CPX labs, please order future labs, Thanks , Terri       ------

## 2013-10-25 ENCOUNTER — Other Ambulatory Visit (INDEPENDENT_AMBULATORY_CARE_PROVIDER_SITE_OTHER): Payer: BC Managed Care – PPO

## 2013-10-25 DIAGNOSIS — Z125 Encounter for screening for malignant neoplasm of prostate: Secondary | ICD-10-CM

## 2013-10-25 DIAGNOSIS — D72819 Decreased white blood cell count, unspecified: Secondary | ICD-10-CM

## 2013-10-25 DIAGNOSIS — D638 Anemia in other chronic diseases classified elsewhere: Secondary | ICD-10-CM

## 2013-10-25 DIAGNOSIS — E785 Hyperlipidemia, unspecified: Secondary | ICD-10-CM

## 2013-10-25 LAB — CBC WITH DIFFERENTIAL/PLATELET
BASOS PCT: 0.8 % (ref 0.0–3.0)
Basophils Absolute: 0 10*3/uL (ref 0.0–0.1)
EOS PCT: 2.6 % (ref 0.0–5.0)
Eosinophils Absolute: 0.1 10*3/uL (ref 0.0–0.7)
HEMATOCRIT: 34.7 % — AB (ref 39.0–52.0)
HEMOGLOBIN: 11.6 g/dL — AB (ref 13.0–17.0)
Lymphocytes Relative: 50.8 % — ABNORMAL HIGH (ref 12.0–46.0)
Lymphs Abs: 2 10*3/uL (ref 0.7–4.0)
MCHC: 33.4 g/dL (ref 30.0–36.0)
MCV: 92.5 fl (ref 78.0–100.0)
MONO ABS: 0.3 10*3/uL (ref 0.1–1.0)
Monocytes Relative: 8.6 % (ref 3.0–12.0)
NEUTROS ABS: 1.5 10*3/uL (ref 1.4–7.7)
Neutrophils Relative %: 37.2 % — ABNORMAL LOW (ref 43.0–77.0)
Platelets: 192 10*3/uL (ref 150.0–400.0)
RBC: 3.76 Mil/uL — AB (ref 4.22–5.81)
RDW: 13.5 % (ref 11.5–15.5)
WBC: 4 10*3/uL (ref 4.0–10.5)

## 2013-10-25 LAB — COMPREHENSIVE METABOLIC PANEL
ALT: 24 U/L (ref 0–53)
AST: 25 U/L (ref 0–37)
Albumin: 4 g/dL (ref 3.5–5.2)
Alkaline Phosphatase: 35 U/L — ABNORMAL LOW (ref 39–117)
BILIRUBIN TOTAL: 0.7 mg/dL (ref 0.2–1.2)
BUN: 35 mg/dL — ABNORMAL HIGH (ref 6–23)
CO2: 27 meq/L (ref 19–32)
CREATININE: 1.8 mg/dL — AB (ref 0.4–1.5)
Calcium: 9.7 mg/dL (ref 8.4–10.5)
Chloride: 108 mEq/L (ref 96–112)
GFR: 41.54 mL/min — AB (ref >60.00)
GLUCOSE: 72 mg/dL (ref 70–99)
Potassium: 5.1 mEq/L (ref 3.5–5.1)
Sodium: 140 mEq/L (ref 135–145)
Total Protein: 6.4 g/dL (ref 6.0–8.3)

## 2013-10-25 LAB — LIPID PANEL
Cholesterol: 171 mg/dL (ref 0–200)
HDL: 52.2 mg/dL (ref >39.00)
LDL Cholesterol: 108 mg/dL — ABNORMAL HIGH (ref 0–99)
NonHDL: 118.8
Total CHOL/HDL Ratio: 3
Triglycerides: 54 mg/dL (ref 0.0–149.0)
VLDL: 10.8 mg/dL (ref 0.0–40.0)

## 2013-10-25 LAB — PSA, MEDICARE: PSA: 0.3 ng/ml (ref 0.10–4.00)

## 2013-11-01 ENCOUNTER — Encounter: Payer: Self-pay | Admitting: Family Medicine

## 2013-11-01 ENCOUNTER — Telehealth: Payer: Self-pay | Admitting: Family Medicine

## 2013-11-01 ENCOUNTER — Ambulatory Visit (INDEPENDENT_AMBULATORY_CARE_PROVIDER_SITE_OTHER): Payer: BC Managed Care – PPO | Admitting: Family Medicine

## 2013-11-01 VITALS — BP 136/74 | HR 52 | Temp 98.2°F | Ht 69.0 in | Wt 258.0 lb

## 2013-11-01 DIAGNOSIS — N179 Acute kidney failure, unspecified: Secondary | ICD-10-CM | POA: Insufficient documentation

## 2013-11-01 DIAGNOSIS — E785 Hyperlipidemia, unspecified: Secondary | ICD-10-CM

## 2013-11-01 DIAGNOSIS — I10 Essential (primary) hypertension: Secondary | ICD-10-CM

## 2013-11-01 DIAGNOSIS — N189 Chronic kidney disease, unspecified: Principal | ICD-10-CM

## 2013-11-01 DIAGNOSIS — Z23 Encounter for immunization: Secondary | ICD-10-CM

## 2013-11-01 DIAGNOSIS — Z0000 Encounter for general adult medical examination without abnormal findings: Secondary | ICD-10-CM

## 2013-11-01 LAB — BASIC METABOLIC PANEL
BUN: 30 mg/dL — ABNORMAL HIGH (ref 6–23)
CHLORIDE: 105 meq/L (ref 96–112)
CO2: 28 mEq/L (ref 19–32)
Calcium: 9.6 mg/dL (ref 8.4–10.5)
Creatinine, Ser: 1.4 mg/dL (ref 0.4–1.5)
GFR: 56.05 mL/min — ABNORMAL LOW (ref >60.00)
GLUCOSE: 92 mg/dL (ref 70–99)
Potassium: 4.5 mEq/L (ref 3.5–5.1)
Sodium: 140 mEq/L (ref 135–145)

## 2013-11-01 NOTE — Patient Instructions (Signed)
Stop at lab on way out. Restart the amlodipine daily.  Continue to hold the lisinopril for now. Stay off ibuprofen, use tylenol for pain. Get back on track with low cholesterol diet and taking red yeast rice 2400 mg divided daily.  Get back  to exercise.

## 2013-11-01 NOTE — Telephone Encounter (Signed)
Notify pt that kidney function is back in your baseline range!  Stay off ibuprofen and keep drinking a lot of water.  Restart lisinopril and return in 7-10 days for BMET recheck.

## 2013-11-01 NOTE — Assessment & Plan Note (Addendum)
Restart amlodipine, will try to restart lisinopril if kidney function improving.

## 2013-11-01 NOTE — Telephone Encounter (Signed)
Mr. Albor notified as instructed by telephone.  Lab appointment scheduled for 11/12/2013 @ 8:45am for BMET per Dr. Diona Browner.

## 2013-11-01 NOTE — Progress Notes (Signed)
Pre visit review using our clinic review tool, if applicable. No additional management support is needed unless otherwise documented below in the visit note. 

## 2013-11-01 NOTE — Progress Notes (Signed)
The patient is here for annual wellness exam and preventative care.   Feeling well voverall.  Change in creatinine since last check 03/2013 Cr 1.5 to 1.8 He drinks moderate amount of water. He has been drinking a lot of water recently. No vomiting or diarrhea.  No urine obstruction. No dysuria. Had been taking ibuprofen 600 mg nightly. No family history of kidney issues.  Hypertension:  He has stopped both amlodipine and lisinopril because of recent creatinine increase. Using medication without problems or lightheadedness: None Chest pain with exertion:None Edema:None Short of breath:None Average home BPs: Since being off med 134-164/66-73 Other issues:   BP Readings from Last 3 Encounters:  11/01/13 136/74  09/18/13 128/60  05/01/13 140/70      Wt Readings from Last 3 Encounters:  11/01/13 258 lb (117.028 kg)  09/18/13 260 lb 11.2 oz (118.253 kg)  05/01/13 254 lb 8 oz (115.44 kg)     Elevated Cholesterol:Not at goal LDL <70, no longer on crestor a week given mucsle cramps.  On red yeast rice and tricor  Not taking regularly Lab Results  Component Value Date   CHOL 171 10/25/2013   HDL 52.20 10/25/2013   LDLCALC 108* 10/25/2013   TRIG 54.0 10/25/2013   CHOLHDL 3 10/25/2013  Using medications without problems:None   Muscle aches: None   Diet compliance:   Exercise: no formal exercsie.  Hyperglycemia: glucose was 146 but he was not fasting.  Anemia, no blood loss. Normal iron in 05/2013.  Likely anemia of chronic disease.    Review of Systems   Constitutional: Negative for fever, fatigue and unexpected weight change.   HENT: Negative for ear pain, congestion, sore throat, rhinorrhea, trouble swallowing and postnasal drip.   Eyes: Negative for pain.   Respiratory: Negative for cough, shortness of breath and wheezing.   Cardiovascular: Negative for chest pain, palpitations and leg swelling.   Gastrointestinal: Positive for constipation. Negative for nausea, abdominal pain,  diarrhea and blood in stool.   Genitourinary: Negative for dysuria, urgency, hematuria, discharge, penile swelling, scrotal swelling, difficulty urinating, penile pain and testicular pain.   Musculoskeletal: Positive for arthralgias.   Skin: Negative for rash.   Neurological: Negative for syncope, weakness, light-headedness, numbness and headaches.   Psychiatric/Behavioral: Negative for behavioral problems and dysphoric mood. The patient is not nervous/anxious.   Objective:     Physical Exam  Constitutional: He appears well-developed and well-nourished. Non-toxic appearance. He does not appear ill. No distress.  Obese, central obesity   HENT:   Head: Normocephalic and atraumatic.  Right Ear: Hearing, tympanic membrane, external ear and ear canal normal.  Left Ear: Hearing, tympanic membrane, external ear and ear canal normal.   Nose: Nose normal.   Mouth/Throat: Uvula is midline, oropharynx is clear and moist and mucous membranes are normal.   Eyes: Conjunctivae, EOM and lids are normal. Pupils are equal, round, and reactive to light. No foreign bodies found.   Neck: Trachea normal, normal range of motion and phonation normal. Neck supple. Carotid bruit is not present. No mass and no thyromegaly present.   Cardiovascular: Normal rate, regular rhythm, S1 normal, S2 normal, intact distal pulses and normal pulses. Exam reveals no gallop.   No murmur heard.   Pulmonary/Chest: Breath sounds normal. He has no wheezes. He has no rhonchi. He has no rales.   Abdominal: Soft. Normal appearance and bowel sounds are normal. There is no hepatosplenomegaly. There is no tenderness. There is no rebound, no guarding and no CVA tenderness.  No hernia. Hernia confirmed negative in the right inguinal area and confirmed negative in the left inguinal area.  Genitourinary: Prostate normal, testes normal and penis normal. Rectal exam shows no external hemorrhoid, no internal hemorrhoid, no fissure, no mass, no  tenderness and anal tone normal. Guaiac negative stool. Prostate is not enlarged and not tender. Right testis shows no mass and no tenderness. Left testis shows no mass and no tenderness. No paraphimosis or penile tenderness.  Lymphadenopathy:  He has no cervical adenopathy.  Right: No inguinal adenopathy present.  Left: No inguinal adenopathy present.  Neurological: He is alert. He has normal strength and normal reflexes. No cranial nerve deficit or sensory deficit. Gait normal.   Skin: Skin is warm, dry and intact. No rash noted.  Psychiatric: He has a normal mood and affect. His speech is normal and behavior is normal. Judgment normal.   Assessment & Plan:     The patient's preventative maintenance and recommended screening tests for an annual wellness exam were reviewed in full today.   Brought up to date unless services declined.   Counselled on the importance of diet, exercise, and its role in overall health and mortality.   The patient's FH and SH was reviewed, including their home life, tobacco status, and drug and alcohol status.     Lab Results  Component Value Date   PSA 0.30 10/25/2013   PSA 0.29 10/26/2012   PSA 0.34 07/20/2011  Colon: ifob 2011, colonoscopy 2006 neg, repeat in 2016.    Vaccines:  Given flu, prevnar and Tdap, consider shingles. Nonsmoker.

## 2013-11-01 NOTE — Assessment & Plan Note (Signed)
Almost at goal at last OV , will get back on track. Recheck in 6 months.

## 2013-11-01 NOTE — Assessment & Plan Note (Signed)
Likely secondary to mild dehydration , lisinopril and ibuprofen. Recheck today.

## 2013-11-03 ENCOUNTER — Other Ambulatory Visit: Payer: Self-pay | Admitting: Family Medicine

## 2013-11-04 NOTE — Telephone Encounter (Signed)
Last office visit 11/01/2013.  Last refilled 08/08/2013 for #60 with 2 refills. Ok to refill?

## 2013-11-05 ENCOUNTER — Other Ambulatory Visit: Payer: Self-pay | Admitting: *Deleted

## 2013-11-05 MED ORDER — FENOFIBRATE 145 MG PO TABS: 145.0000 mg | ORAL_TABLET | Freq: Every day | ORAL | Status: DC

## 2013-11-05 MED ORDER — AMLODIPINE BESYLATE 5 MG PO TABS: 5.0000 mg | ORAL_TABLET | Freq: Every day | ORAL | Status: DC

## 2013-11-05 NOTE — Telephone Encounter (Signed)
Refill but pt needs to come in for valproic acid level.

## 2013-11-05 NOTE — Telephone Encounter (Signed)
Medication refilled electronically 

## 2013-11-12 ENCOUNTER — Other Ambulatory Visit (INDEPENDENT_AMBULATORY_CARE_PROVIDER_SITE_OTHER): Payer: BC Managed Care – PPO

## 2013-11-12 DIAGNOSIS — N189 Chronic kidney disease, unspecified: Secondary | ICD-10-CM

## 2013-11-12 DIAGNOSIS — N179 Acute kidney failure, unspecified: Secondary | ICD-10-CM

## 2013-11-12 LAB — BASIC METABOLIC PANEL
BUN: 37 mg/dL — AB (ref 6–23)
CALCIUM: 9.6 mg/dL (ref 8.4–10.5)
CO2: 24 mEq/L (ref 19–32)
CREATININE: 1.3 mg/dL (ref 0.4–1.5)
Chloride: 108 mEq/L (ref 96–112)
GFR: 56.52 mL/min — AB (ref >60.00)
Glucose, Bld: 99 mg/dL (ref 70–99)
POTASSIUM: 4.8 meq/L (ref 3.5–5.1)
Sodium: 142 mEq/L (ref 135–145)

## 2014-03-18 ENCOUNTER — Telehealth: Payer: Self-pay | Admitting: Cardiology

## 2014-03-18 NOTE — Telephone Encounter (Signed)
Pt called because BPs had been elevated, assoc w/ illness. Had been taking [place] medicine recently.  He had been concerned about a viral illness that had been going around the house last week. He is denying symptoms other than feeling a little malaise.   He reports no other concerning symptoms. Advised not taking the Alka Seltzer d/t aspirin, check BP in AM. Denies lightheadedness, nausea, etc. BPs have typically been good but he cannot recall what they were before illness.   He has f/u w/ Samara Snide in AM so will get advice tomorrow on better alternatives. He voiced understanding.

## 2014-03-18 NOTE — Telephone Encounter (Signed)
Pt blood pressure been running high,it is running around 170/93 and now it is 184/86 pulse 50. He took some cold medicine last night,wonder if this could be the cause.

## 2014-03-19 ENCOUNTER — Ambulatory Visit (INDEPENDENT_AMBULATORY_CARE_PROVIDER_SITE_OTHER): Payer: Medicare Other | Admitting: Physician Assistant

## 2014-03-19 ENCOUNTER — Encounter: Payer: Self-pay | Admitting: Physician Assistant

## 2014-03-19 ENCOUNTER — Ambulatory Visit: Payer: BC Managed Care – PPO | Admitting: Physician Assistant

## 2014-03-19 VITALS — BP 116/62 | HR 49 | Ht 71.0 in | Wt 265.8 lb

## 2014-03-19 DIAGNOSIS — R001 Bradycardia, unspecified: Secondary | ICD-10-CM | POA: Insufficient documentation

## 2014-03-19 DIAGNOSIS — E669 Obesity, unspecified: Secondary | ICD-10-CM

## 2014-03-19 DIAGNOSIS — I2510 Atherosclerotic heart disease of native coronary artery without angina pectoris: Secondary | ICD-10-CM

## 2014-03-19 DIAGNOSIS — E785 Hyperlipidemia, unspecified: Secondary | ICD-10-CM

## 2014-03-19 DIAGNOSIS — Z9861 Coronary angioplasty status: Secondary | ICD-10-CM

## 2014-03-19 DIAGNOSIS — I10 Essential (primary) hypertension: Secondary | ICD-10-CM

## 2014-03-19 NOTE — Assessment & Plan Note (Signed)
No complaints of angina. We discussed how important daily exercises as well as weight loss

## 2014-03-19 NOTE — Assessment & Plan Note (Signed)
Continue Zetia and TriCor

## 2014-03-19 NOTE — Assessment & Plan Note (Signed)
Refer to registered dietitian. Discussed how obesity and high carb diet can lead to diabetes.

## 2014-03-19 NOTE — Patient Instructions (Signed)
Joshua Stark has referred you to a dietician.   Please keep a record of your blood pressures.  Joshua Fuller, PA-C, wants you to follow-up in 6 months with Dr Ellyn Hack. You will receive a reminder letter in the mail one months in advance. If you don't receive a letter, please call our office to schedule the follow-up appointment.

## 2014-03-19 NOTE — Assessment & Plan Note (Signed)
Patient will continue to monitor. We'll provide him with a log to record her. I recommended he check it once a day and vary the time of day which he checks it. If it continues to be above 130/80 he will call.

## 2014-03-19 NOTE — Progress Notes (Signed)
Patient ID: NAS WAFER, male   DOB: [birth year], 69 y.o.   MRN: [id number]    Date:  03/19/2014   ID:  Joshua Stark, DOB 13-Jan-1946, MRN [id number]  PCP:  Joshua Lofts, MD  Primary Cardiologist:  Joshua Stark   Chief Complaint  Patient presents with  . ROV 6 months    patient states he has been feeling very anxious, reports no other complaints.     History of Present Illness: Joshua Stark is a 69 y.o. male  with a Cardiovascular Problem Listed below who present today with complaints of feeling "different, anxious, nervous." He checked his blood pressure yesterday looks like every half-hour and it ranged from 132-440 systolic.  It was mostly in the 160s to 170s range. This morning in the clinic it is better: 138/78.  Yesterday he drank two Dr. Anda Latina) in the morning.    The patient currently denies nausea, vomiting, fever, chest pain, shortness of breath, orthopnea, dizziness, PND, cough, congestion, abdominal pain, hematochezia, melena, lower extremity edema, claudication.  Wt Readings from Last 3 Encounters:  03/19/14 265 lb 12.8 oz (120.566 kg)  11/01/13 258 lb (117.028 kg)  09/18/13 260 lb 11.2 oz (118.253 kg)     Past Medical History  Diagnosis Date  . CAD S/P percutaneous coronary angioplasty 1999    PCI- prox LAD @ D1, SP1 trifurcation.  3.0 mm x 16 mm AVE-GFX BMS  . History of nuclear stress test August 2012    Treadmill Myoview: 9 minutes, 10 METS --> hypertensive response.  No evidence of ischemia or infarction.  EF 64%  . Hypertension   . HLD (hyperlipidemia)   . Obesity (BMI 30-39.9)   . Seizure disorder     Current Outpatient Prescriptions  Medication Sig Dispense Refill  . acetaminophen (TYLENOL) 500 MG tablet Take 500 mg by mouth as needed for pain.    Marland Kitchen amLODipine (NORVASC) 5 MG tablet Take 1 tablet (5 mg total) by mouth daily. 30 tablet 4  . aspirin 81 MG tablet Take 81 mg by mouth daily.      Marland Kitchen DEPAKOTE 250 MG DR tablet TAKE 1 TABLET BY MOUTH TWICE A  DAY 60 tablet 5  . ezetimibe (ZETIA) 10 MG tablet Take 1 tablet (10 mg total) by mouth daily. 30 tablet 6  . fenofibrate (TRICOR) 145 MG tablet Take 1 tablet (145 mg total) by mouth daily. 30 tablet 4  . fish oil-omega-3 fatty acids 1000 MG capsule Take 2 g by mouth daily.      . Garlic 102 MG TABS Take by mouth.    . Glucosamine-Chondroitin (OSTEO BI-FLEX REGULAR STRENGTH PO) Take by mouth.    Marland Kitchen lisinopril (PRINIVIL,ZESTRIL) 40 MG tablet Take 1 tablet (40 mg total) by mouth daily. 30 tablet 9  . Multiple Vitamin (MULTIVITAMIN) capsule Take 1 capsule by mouth daily.      Marland Kitchen OVER THE COUNTER MEDICATION Take 2 tablet cherry pill a day    . Red Yeast Rice Extract (RED YEAST RICE PO) Take by mouth.    . Sennosides (SENOKOT PO) Take by mouth.     No current facility-administered medications for this visit.    Allergies:    Allergies  Allergen Reactions  . Pravastatin Sodium     REACTION: cramps  . Rosuvastatin     REACTION: cramps  . Statins     Social History:  The patient  reports that he has never smoked. He has never used smokeless tobacco. He reports that  he does not drink alcohol or use illicit drugs.   Family history:   Family History  Problem Relation Age of Onset  . Hypertension Mother   . Cancer Father     lung and prostate    ROS:  Please see the history of present illness.  All other systems reviewed and negative.   PHYSICAL EXAM: VS:  BP 116/62 mmHg  Pulse 49  Ht 5\' 11"  (1.803 m)  Wt 265 lb 12.8 oz (120.566 kg)  BMI 37.09 kg/m2 obese, well developed, in no acute distress HEENT: Pupils are equal round react to light accommodation extraocular movements are intact.  Neck: no JVDNo cervical lymphadenopathy. Cardiac: Regular rhythm rate slow, without murmurs rubs or gallops. Lungs:  clear to auscultation bilaterally, no wheezing, rhonchi or rales Abd: soft, nontender, positive bowel sounds all quadrants, no hepatosplenomegaly Ext: no lower extremity edema.  2+  radial and dorsalis pedis pulses. Skin: warm and dry Neuro:  Grossly normal  EKG:  His bradycardia rate 49 bpm   ASSESSMENT AND PLAN:  Problem List Items Addressed This Visit    Obesity (BMI 30-39.9) - Primary (Chronic)    Refer to registered dietitian. Discussed how obesity and high carb diet can lead to diabetes.      Hyperlipidemia with target LDL less than 70 (Chronic)    Continue Zetia and TriCor      Essential hypertension (Chronic)    Patient will continue to monitor. We'll provide him with a log to record her. I recommended he check it once a day and vary the time of day which he checks it. If it continues to be above 130/80 he will call.      CAD S/P percutaneous coronary angioplasty (Chronic)    No complaints of angina. We discussed how important daily exercises as well as weight loss

## 2014-03-19 NOTE — Assessment & Plan Note (Signed)
Avoid beta blockers.  

## 2014-04-26 ENCOUNTER — Other Ambulatory Visit (INDEPENDENT_AMBULATORY_CARE_PROVIDER_SITE_OTHER): Payer: BLUE CROSS/BLUE SHIELD

## 2014-04-26 ENCOUNTER — Telehealth: Payer: Self-pay | Admitting: Family Medicine

## 2014-04-26 DIAGNOSIS — D638 Anemia in other chronic diseases classified elsewhere: Secondary | ICD-10-CM

## 2014-04-26 DIAGNOSIS — E785 Hyperlipidemia, unspecified: Secondary | ICD-10-CM

## 2014-04-26 LAB — LIPID PANEL
CHOL/HDL RATIO: 3
CHOLESTEROL: 176 mg/dL (ref 0–200)
HDL: 53.1 mg/dL (ref >39.00)
LDL Cholesterol: 102 mg/dL — ABNORMAL HIGH (ref 0–99)
NonHDL: 122.9
TRIGLYCERIDES: 107 mg/dL (ref 0.0–149.0)
VLDL: 21.4 mg/dL (ref 0.0–40.0)

## 2014-04-26 LAB — COMPREHENSIVE METABOLIC PANEL
ALT: 28 U/L (ref 0–53)
AST: 26 U/L (ref 0–37)
Albumin: 4.1 g/dL (ref 3.5–5.2)
Alkaline Phosphatase: 36 U/L — ABNORMAL LOW (ref 39–117)
BILIRUBIN TOTAL: 0.3 mg/dL (ref 0.2–1.2)
BUN: 33 mg/dL — ABNORMAL HIGH (ref 6–23)
CHLORIDE: 108 meq/L (ref 96–112)
CO2: 29 meq/L (ref 19–32)
CREATININE: 1.66 mg/dL — AB (ref 0.40–1.50)
Calcium: 9.9 mg/dL (ref 8.4–10.5)
GFR: 44.09 mL/min — AB (ref >60.00)
Glucose, Bld: 77 mg/dL (ref 70–99)
POTASSIUM: 5.3 meq/L — AB (ref 3.5–5.1)
SODIUM: 139 meq/L (ref 135–145)
TOTAL PROTEIN: 6.3 g/dL (ref 6.0–8.3)

## 2014-04-26 NOTE — Telephone Encounter (Signed)
-----   Message from Marchia Bond sent at 04/22/2014  4:02 PM EDT ----- Regarding: 6 mo f/u labs Fri 04/26/14 Please order  Future 6 mo f/u labs for pt's upcoming lab appt. Thanks Aniceto Boss

## 2014-05-03 ENCOUNTER — Encounter: Payer: Self-pay | Admitting: Family Medicine

## 2014-05-03 ENCOUNTER — Ambulatory Visit (INDEPENDENT_AMBULATORY_CARE_PROVIDER_SITE_OTHER): Payer: BLUE CROSS/BLUE SHIELD | Admitting: Family Medicine

## 2014-05-03 ENCOUNTER — Other Ambulatory Visit: Payer: Self-pay | Admitting: *Deleted

## 2014-05-03 VITALS — BP 158/80 | HR 55 | Temp 97.7°F | Ht 71.0 in | Wt 263.5 lb

## 2014-05-03 DIAGNOSIS — N179 Acute kidney failure, unspecified: Secondary | ICD-10-CM

## 2014-05-03 DIAGNOSIS — I10 Essential (primary) hypertension: Secondary | ICD-10-CM | POA: Diagnosis not present

## 2014-05-03 DIAGNOSIS — N189 Chronic kidney disease, unspecified: Secondary | ICD-10-CM | POA: Diagnosis not present

## 2014-05-03 DIAGNOSIS — E785 Hyperlipidemia, unspecified: Secondary | ICD-10-CM

## 2014-05-03 LAB — BASIC METABOLIC PANEL
BUN: 41 mg/dL — ABNORMAL HIGH (ref 6–23)
CO2: 27 mEq/L (ref 19–32)
CREATININE: 1.28 mg/dL (ref 0.40–1.50)
Calcium: 9.7 mg/dL (ref 8.4–10.5)
Chloride: 107 mEq/L (ref 96–112)
GFR: 59.51 mL/min — ABNORMAL LOW (ref >60.00)
GLUCOSE: 92 mg/dL (ref 70–99)
POTASSIUM: 4.8 meq/L (ref 3.5–5.1)
Sodium: 138 mEq/L (ref 135–145)

## 2014-05-03 MED ORDER — FENOFIBRATE 145 MG PO TABS: 145.0000 mg | ORAL_TABLET | Freq: Every day | ORAL | Status: DC

## 2014-05-03 NOTE — Progress Notes (Signed)
Pre visit review using our clinic review tool, if applicable. No additional management support is needed unless otherwise documented below in the visit note. 

## 2014-05-03 NOTE — Assessment & Plan Note (Signed)
Holding lisinopril. Pushing fluids.  Will recheck Cr and potassium.

## 2014-05-03 NOTE — Progress Notes (Signed)
   Subjective:    Patient ID: Joshua Stark, male    DOB: [birth year], 69 y.o.   MRN: [id number]  HPI  69 year old male presents for 6 month follow up.  Hypertension:  On amlodipine,  No longer on lisinopril in last week given elevated  Potassium 5.3, kidney function 1.66 (Cr 1.3-1.4) Was taking multivitamin every few day, some magnesium.  No N/V/ D. Eats a bannan every other day. BP Readings from Last 3 Encounters:  05/03/14 158/80  03/19/14 116/62  11/01/13 136/74  Using medication without problems or lightheadedness:  None Chest pain with exertion:None Edema:None Short of breath: Average home BPs: 140-150/80s Other issues:    Elevated Cholesterol: Hx of CAD, goal LD < 70 . Not at goal  On zetia,  Had not lately been taking red yeast rice and tricor, SE  To statins. Lab Results  Component Value Date   CHOL 176 04/26/2014   HDL 53.10 04/26/2014   LDLCALC 102* 04/26/2014   TRIG 107.0 04/26/2014   CHOLHDL 3 04/26/2014  Using medications without problems: None Muscle aches: None Diet compliance: Moderate Exercise: walking daily. Other complaints:   CKD: recent change in kidney function.  Review of Systems  Constitutional: Negative for fever and fatigue.  HENT: Negative for ear pain.   Eyes: Negative for pain.  Respiratory: Negative for shortness of breath.   Cardiovascular: Negative for chest pain.       Objective:   Physical Exam  Constitutional: Vital signs are normal. He appears well-developed and well-nourished.  obese  HENT:  Head: Normocephalic.  Right Ear: Hearing normal.  Left Ear: Hearing normal.  Nose: Nose normal.  Mouth/Throat: Oropharynx is clear and moist and mucous membranes are normal.  Neck: Trachea normal. Carotid bruit is not present. No thyroid mass and no thyromegaly present.  Cardiovascular: Normal rate, regular rhythm and normal pulses.  Exam reveals no gallop, no distant heart sounds and no friction rub.   No murmur heard. No  peripheral edema  Pulmonary/Chest: Effort normal and breath sounds normal. No respiratory distress.  Skin: Skin is warm, dry and intact. No rash noted.  Psychiatric: He has a normal mood and affect. His speech is normal and behavior is normal. Thought content normal.          Assessment & Plan:

## 2014-05-03 NOTE — Patient Instructions (Addendum)
Stop at lab on way put to check potassium and kidney function.  May be able to restart lisinopril.  Restart red yeast rice 1200 mg twice daily.

## 2014-05-03 NOTE — Assessment & Plan Note (Signed)
Not at goal on tricor, zetia. Pt not using red yeast rice but will start. SE to statins in past. Encouraged exercise, weight loss, healthy eating habits.

## 2014-05-03 NOTE — Assessment & Plan Note (Signed)
Elevated off lisinopril.

## 2014-05-13 ENCOUNTER — Other Ambulatory Visit: Payer: Self-pay | Admitting: Family Medicine

## 2014-05-13 DIAGNOSIS — I10 Essential (primary) hypertension: Secondary | ICD-10-CM

## 2014-05-15 ENCOUNTER — Telehealth: Payer: Self-pay | Admitting: Cardiology

## 2014-05-15 MED ORDER — NITROGLYCERIN 0.4 MG SL SUBL: 0.4000 mg | SUBLINGUAL_TABLET | SUBLINGUAL | Status: DC | PRN

## 2014-05-15 NOTE — Telephone Encounter (Signed)
Rx(s) sent to pharmacy electronically. Patient notified - no chest pain, his bottle just broke

## 2014-05-15 NOTE — Telephone Encounter (Signed)
°  1. Which medications need to be refilled? Nitoglycerin  2. Which pharmacy is medication to be sent to?[facility]-[phone]  3. Do they need a 30 day or 90 day supply? 1 bottle  4. Would they like a call back once the medication has been sent to the pharmacy? yes

## 2014-05-17 ENCOUNTER — Other Ambulatory Visit (INDEPENDENT_AMBULATORY_CARE_PROVIDER_SITE_OTHER): Payer: BLUE CROSS/BLUE SHIELD

## 2014-05-17 DIAGNOSIS — I10 Essential (primary) hypertension: Secondary | ICD-10-CM | POA: Diagnosis not present

## 2014-05-17 LAB — BASIC METABOLIC PANEL
BUN: 40 mg/dL — AB (ref 6–23)
CO2: 27 mEq/L (ref 19–32)
Calcium: 9.3 mg/dL (ref 8.4–10.5)
Chloride: 109 mEq/L (ref 96–112)
Creatinine, Ser: 1.44 mg/dL (ref 0.40–1.50)
GFR: 51.94 mL/min — ABNORMAL LOW (ref >60.00)
Glucose, Bld: 87 mg/dL (ref 70–99)
POTASSIUM: 4.5 meq/L (ref 3.5–5.1)
SODIUM: 141 meq/L (ref 135–145)

## 2014-06-03 ENCOUNTER — Other Ambulatory Visit: Payer: Self-pay | Admitting: Family Medicine

## 2014-06-04 NOTE — Telephone Encounter (Signed)
Last office visit 05/03/2014.  Last refilled 11/05/2013 for #60 with 5 refills.  Ok to refill?

## 2014-07-22 ENCOUNTER — Other Ambulatory Visit: Payer: Self-pay

## 2014-07-22 MED ORDER — EZETIMIBE 10 MG PO TABS: 10.0000 mg | ORAL_TABLET | Freq: Every day | ORAL | Status: DC

## 2014-07-22 MED ORDER — LISINOPRIL 40 MG PO TABS: 40.0000 mg | ORAL_TABLET | Freq: Every day | ORAL | Status: DC

## 2014-08-08 ENCOUNTER — Encounter: Payer: Self-pay | Admitting: Gastroenterology

## 2014-09-20 ENCOUNTER — Encounter: Payer: Self-pay | Admitting: Cardiology

## 2014-09-20 ENCOUNTER — Ambulatory Visit (INDEPENDENT_AMBULATORY_CARE_PROVIDER_SITE_OTHER): Payer: BLUE CROSS/BLUE SHIELD | Admitting: Cardiology

## 2014-09-20 VITALS — BP 128/74 | HR 46 | Ht 71.0 in | Wt 263.0 lb

## 2014-09-20 DIAGNOSIS — I2510 Atherosclerotic heart disease of native coronary artery without angina pectoris: Secondary | ICD-10-CM

## 2014-09-20 DIAGNOSIS — I351 Nonrheumatic aortic (valve) insufficiency: Secondary | ICD-10-CM

## 2014-09-20 DIAGNOSIS — R001 Bradycardia, unspecified: Secondary | ICD-10-CM | POA: Diagnosis not present

## 2014-09-20 DIAGNOSIS — E669 Obesity, unspecified: Secondary | ICD-10-CM

## 2014-09-20 DIAGNOSIS — I10 Essential (primary) hypertension: Secondary | ICD-10-CM

## 2014-09-20 DIAGNOSIS — Z9861 Coronary angioplasty status: Secondary | ICD-10-CM

## 2014-09-20 DIAGNOSIS — E785 Hyperlipidemia, unspecified: Secondary | ICD-10-CM

## 2014-09-20 NOTE — Progress Notes (Signed)
PCP: Eliezer Lofts, MD  Clinic Note: Chief Complaint  Patient presents with  . 6 month follow up    Patient has no complaints.  . Coronary Artery Disease  . Palpitations    noted last visit - no longer present    HPI: Joshua Stark is a 69 y.o. male with a PMH below who presents today for single-vessel CAD.Joshua Stark was last seen by me in August 2015. He is seen every 6 months alternating between myself and Mr. Tarri Fuller, PA-C.    Last seen by Gaspar Bidding in February 2016:noted feeling "different/anxious and nervous. Wide range of blood pressure. Most in the 160s 170s. He drank 2 32 ounce Dr. Samson Frederic in the morning -- likely etiology. Blood pressure is better clinic. Asked to log blood pressure record. Discussed dietary regimen and referred to dietitian. No complaints.  Recent Hospitalizations: n/a  Studies Reviewed:   2-D Echocardiogram 08/2013::normal LV size and function. EF 60-65%. Normal diastolic function. Mild LA dilation and mild AI.  No evidence of aortic stenosis. No significant MR. -- Nothing to explain his murmur  Interval History: Doing well. Walking - when he can, not routine.  Does walk during work.  Did go to Dietitian, but was not comfortable - so he stopped going.  3 appts - but stopped after.  More bothered by heat than he used to.  No real luck with wgt loss.  Cardiovascular ROS: no chest pain or dyspnea on exertion negative for - edema, irregular heartbeat, loss of consciousness, orthopnea, palpitations, paroxysmal nocturnal dyspnea, rapid heart rate, shortness of breath or TIA/Amaurosis fugax, near syncope.  ROS: A comprehensive was performed. Review of Systems  Constitutional: Negative for malaise/fatigue.  HENT: Negative for nosebleeds.   Respiratory: Positive for cough (rare/normal).   Gastrointestinal: Negative for blood in stool and melena.  Genitourinary: Negative for hematuria and flank pain.  Endo/Heme/Allergies: Bruises/bleeds easily.  All  other systems reviewed and are negative.   Past Medical History  Diagnosis Date  . CAD S/P percutaneous coronary angioplasty 1999    PCI- prox LAD @ D1, SP1 trifurcation.  3.0 mm x 16 mm AVE-GFX BMS  . History of nuclear stress test August 2012    Treadmill Myoview: 9 minutes, 10 METS --> hypertensive response.  No evidence of ischemia or infarction.  EF 64%  . Hypertension   . HLD (hyperlipidemia)   . Obesity (BMI 30-39.9)   . Seizure disorder    Past Surgical History  Procedure Laterality Date  . Nm myocar perf wall motion  Aug 2 ,2012    treadmill myoview exercised 9 minutes, reacing 10 metaboblic equivlents,had ahypertensive reponse .EF64%  . Cardiac catheterization  01/20/1998  . Coronary angioplasty  01/20/1998    PCI to LAD ,prox LAD at trifurcation for D1and SP1.3.0-x16-mm AVE-GFX bare-metal stent  . Transthoracic echocardiogram  09/20/2013    normal LV size and function. EF 60-65%. Normal diastolic function. Mild LA dilation and mild AI.  No evidence of aortic stenosis. No significant MR.   Prior to Admission medications   Medication Sig Start Date End Date Taking? Authorizing Provider  acetaminophen (TYLENOL) 500 MG tablet Take 500 mg by mouth as needed for pain.    Historical Provider, MD  amLODipine (NORVASC) 5 MG tablet Take 1 tablet (5 mg total) by mouth daily. 11/05/13   Leonie Man, MD  aspirin 81 MG tablet Take 81 mg by mouth daily.      Historical Provider, MD  DEPAKOTE 250 MG DR tablet TAKE 1 TABLET BY MOUTH TWICE A DAY 06/04/14   Amy E Diona Browner, MD  ezetimibe (ZETIA) 10 MG tablet Take 1 tablet (10 mg total) by mouth daily. 07/22/14   Leonie Man, MD  fenofibrate (TRICOR) 145 MG tablet Take 1 tablet (145 mg total) by mouth daily. 05/03/14   Leonie Man, MD  fish oil-omega-3 fatty acids 1000 MG capsule Take 2 g by mouth daily.      Historical Provider, MD  Garlic 673 MG TABS Take by mouth.    Historical Provider, MD  Glucosamine-Chondroitin (OSTEO BI-FLEX  REGULAR STRENGTH PO) Take by mouth.    Historical Provider, MD  lisinopril (PRINIVIL,ZESTRIL) 40 MG tablet Take 1 tablet (40 mg total) by mouth daily. 07/22/14   Leonie Man, MD  Multiple Vitamin (MULTIVITAMIN) capsule Take 1 capsule by mouth daily.      Historical Provider, MD  nitroGLYCERIN (NITROSTAT) 0.4 MG SL tablet Place 1 tablet (0.4 mg total) under the tongue every 5 (five) minutes as needed for chest pain. 05/15/14   Leonie Man, MD  OVER THE COUNTER MEDICATION Take 2 tablet cherry pill a day    Historical Provider, MD  Red Yeast Rice Extract (RED YEAST RICE PO) Take by mouth.    Historical Provider, MD  Sennosides (SENOKOT PO) Take by mouth.    Historical Provider, MD   Allergies  Allergen Reactions  . Pravastatin Sodium     REACTION: cramps  . Rosuvastatin     REACTION: cramps  . Statins     Social History   Social History  . Marital Status: Married    Spouse Name: N/A  . Number of Children: 2  . Years of Education: N/A   Occupational History  .     Social History Main Topics  . Smoking status: Never Smoker   . Smokeless tobacco: Never Used  . Alcohol Use: No  . Drug Use: No  . Sexual Activity: Not Asked   Other Topics Concern  . None   Social History Narrative   He is a married father of 2, grandfather 52.   He usually walks 5 days a week.   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  He does a lot of activity and strenuous labor with this as well.   He never smoked, and does not drink alcohol.   Family History  Problem Relation Age of Onset  . Hypertension Mother   . Cancer Father     lung and prostate    Wt Readings from Last 3 Encounters:  09/20/14 263 lb (119.296 kg)  05/03/14 263 lb 8 oz (119.523 kg)  03/19/14 265 lb 12.8 oz (120.566 kg)    PHYSICAL EXAM BP 128/74 mmHg  Pulse 46  Ht 5\' 11"  (1.803 m)  Wt 263 lb (119.296 kg)  BMI 36.70 kg/m2 General: A & O x 3. Pleasant, relatively healthy-appearing, well  groomed gentleman, NAD; moderately obese.  HEENT: NCAT. EOMI. MMM. Anicteric sclerae.  Neck: Supple with no LAN, JVD, or carotid bruit.  Heart: RRR. Normal S1, S2. 1-2/6 SEM @ RUSB ->carotids M/R/G. Nondisplaced PMI.  Lungs: CTA B., nonlabored, good air movement no W./R./R.  Abdomen: Obese but otherwise soft/NT/ND/NABS. No HSM.  Extremities: No C/C/E. 2+ equal pulses throughout. No rashes or lesions. NEURO: grossly intact. Cranial nerves II through XII grossly intact    Adult ECG Report  Rate: 46 ;  Rhythm: sinus bradycardia and  Borderline LVH. Nonspecific ST and T-wave changes. Q waves in the lateral leads, cannot exclude lateral MI, age undetermined.;   Narrative Interpretation: stable EKG   Other studies Reviewed: Additional studies/ records that were reviewed today include:  Recent Labs:  Lab Results  Component Value Date   CHOL 176 04/26/2014   HDL 53.10 04/26/2014   LDLCALC 102* 04/26/2014   TRIG 107.0 04/26/2014   CHOLHDL 3 04/26/2014      ASSESSMENT / PLAN: Problem List Items Addressed This Visit    Aortic ejection murmur    Notable finding on exam. May still be mild aortic sclerosis. Thankfully, no sign of any significant valvular disease.      Bradycardia    Stable. Avoid AV nodal agents including beta blockers and non-dihydropyridine calcium blockers      Relevant Orders   EKG 12-Lead   CAD S/P percutaneous coronary angioplasty (Chronic)    No complaints of angina.  On aspirin, ACE inhibitor and calcium channel blocker. Note a blocker due to bradycardia. Is not on statin due to 2 significant tolerance. He is taking Zetia and TriCor. Continue discussed importance of daily exercise.      Relevant Orders   EKG 12-Lead   Essential hypertension - Primary (Chronic)    Excellent control. Apparently had been off of lisinopril. Now back on. Blood pressure was good now.      Relevant Orders   EKG 12-Lead   Hyperlipidemia with target LDL less than 70  (Chronic)    Monitored by PCP. Recommended he go back on red yeast rice along with TriCor and Zetia. Continue to encourage exercise and dietary adjustments to improve weight loss. If he does not meet goal with current treatment options, may need to consider PC SK 9 in addition treatment.      Relevant Orders   EKG 12-Lead   Obesity (BMI 30-39.9) (Chronic)    He did not have a good relationship with the dietitian. Felt like he was not getting any good information. May consider referral to a different nutrition counseling at [facility].         Current medicines are reviewed at length with the patient today. (+/- concerns) n/a The following changes have been made: n/a Studies Ordered:   Orders Placed This Encounter  Procedures  . EKG 12-Lead   NO CHANGE WITH CURRENT MEDICATIONS  PLEASE PRIMARY TO SEND RESULTS OF CHOLESTEROL PANEL ( THROUGH RESULT NOTES IN COMPUTER)  Your physician recommends that you schedule a follow-up appointment in [place] [facility].  Your physician wants you to follow-up in  [place].    Leonie Man, M.D., M.S. Interventional Cardiologist   Pager # [phone]

## 2014-09-20 NOTE — Patient Instructions (Signed)
NO CHANGE WITH CURRENT MEDICATIONS  PLEASE PRIMARY TO SEND RESULTS OF CHOLESTEROL PANEL ( THROUGH RESULT NOTES IN COMPUTER)  Your physician recommends that you schedule a follow-up appointment in [place] [facility].  Your physician wants you to follow-up in  [place].  You will receive a reminder letter in the mail two months in advance. If you don't receive a letter, please call our office to schedule the follow-up appointment.

## 2014-09-22 ENCOUNTER — Encounter: Payer: Self-pay | Admitting: Cardiology

## 2014-09-22 NOTE — Assessment & Plan Note (Signed)
No complaints of angina.  On aspirin, ACE inhibitor and calcium channel blocker. Note a blocker due to bradycardia. Is not on statin due to 2 significant tolerance. He is taking Zetia and TriCor. Continue discussed importance of daily exercise.

## 2014-09-22 NOTE — Assessment & Plan Note (Signed)
Excellent control. Apparently had been off of lisinopril. Now back on. Blood pressure was good now.

## 2014-09-22 NOTE — Assessment & Plan Note (Signed)
Notable finding on exam. May still be mild aortic sclerosis. Thankfully, no sign of any significant valvular disease.

## 2014-09-22 NOTE — Assessment & Plan Note (Addendum)
Stable. Avoid AV nodal agents including beta blockers and non-dihydropyridine calcium blockers

## 2014-09-22 NOTE — Assessment & Plan Note (Signed)
Monitored by PCP. Recommended he go back on red yeast rice along with TriCor and Zetia. Continue to encourage exercise and dietary adjustments to improve weight loss. If he does not meet goal with current treatment options, may need to consider PC SK 9 in addition treatment.

## 2014-09-22 NOTE — Assessment & Plan Note (Signed)
He did not have a good relationship with the dietitian. Felt like he was not getting any good information. May consider referral to a different nutrition counseling at [facility].

## 2014-10-08 ENCOUNTER — Other Ambulatory Visit: Payer: Self-pay | Admitting: *Deleted

## 2014-10-08 MED ORDER — AMLODIPINE BESYLATE 5 MG PO TABS: 5.0000 mg | ORAL_TABLET | Freq: Every day | ORAL | Status: DC

## 2014-10-30 ENCOUNTER — Telehealth: Payer: Self-pay | Admitting: Family Medicine

## 2014-10-30 ENCOUNTER — Other Ambulatory Visit (INDEPENDENT_AMBULATORY_CARE_PROVIDER_SITE_OTHER): Payer: BLUE CROSS/BLUE SHIELD

## 2014-10-30 DIAGNOSIS — N183 Chronic kidney disease, stage 3 unspecified: Secondary | ICD-10-CM

## 2014-10-30 DIAGNOSIS — D72819 Decreased white blood cell count, unspecified: Secondary | ICD-10-CM | POA: Diagnosis not present

## 2014-10-30 DIAGNOSIS — Z125 Encounter for screening for malignant neoplasm of prostate: Secondary | ICD-10-CM

## 2014-10-30 DIAGNOSIS — D638 Anemia in other chronic diseases classified elsewhere: Secondary | ICD-10-CM | POA: Diagnosis not present

## 2014-10-30 DIAGNOSIS — E785 Hyperlipidemia, unspecified: Secondary | ICD-10-CM

## 2014-10-30 LAB — PSA: PSA: 0.8 ng/mL (ref 0.10–4.00)

## 2014-10-30 LAB — LIPID PANEL
CHOLESTEROL: 150 mg/dL (ref 0–200)
HDL: 50.2 mg/dL (ref >39.00)
LDL Cholesterol: 86 mg/dL (ref 0–99)
NonHDL: 99.53
Total CHOL/HDL Ratio: 3
Triglycerides: 66 mg/dL (ref 0.0–149.0)
VLDL: 13.2 mg/dL (ref 0.0–40.0)

## 2014-10-30 LAB — CBC WITH DIFFERENTIAL/PLATELET
Basophils Absolute: 0 10*3/uL (ref 0.0–0.1)
Basophils Relative: 0.6 % (ref 0.0–3.0)
EOS PCT: 3.4 % (ref 0.0–5.0)
Eosinophils Absolute: 0.1 10*3/uL (ref 0.0–0.7)
HCT: 36.9 % — ABNORMAL LOW (ref 39.0–52.0)
HEMOGLOBIN: 11.8 g/dL — AB (ref 13.0–17.0)
LYMPHS PCT: 48.9 % — AB (ref 12.0–46.0)
Lymphs Abs: 2 10*3/uL (ref 0.7–4.0)
MCHC: 32 g/dL (ref 30.0–36.0)
MCV: 92.8 fl (ref 78.0–100.0)
MONOS PCT: 8.7 % (ref 3.0–12.0)
Monocytes Absolute: 0.3 10*3/uL (ref 0.1–1.0)
Neutro Abs: 1.5 10*3/uL (ref 1.4–7.7)
Neutrophils Relative %: 38.4 % — ABNORMAL LOW (ref 43.0–77.0)
Platelets: 216 10*3/uL (ref 150.0–400.0)
RBC: 3.97 Mil/uL — AB (ref 4.22–5.81)
RDW: 14.4 % (ref 11.5–15.5)
WBC: 4 10*3/uL (ref 4.0–10.5)

## 2014-10-30 LAB — COMPREHENSIVE METABOLIC PANEL
ALBUMIN: 3.9 g/dL (ref 3.5–5.2)
ALK PHOS: 35 U/L — AB (ref 39–117)
ALT: 21 U/L (ref 0–53)
AST: 21 U/L (ref 0–37)
BUN: 34 mg/dL — AB (ref 6–23)
CO2: 26 mEq/L (ref 19–32)
CREATININE: 1.32 mg/dL (ref 0.40–1.50)
Calcium: 9.6 mg/dL (ref 8.4–10.5)
Chloride: 109 mEq/L (ref 96–112)
GFR: 57.35 mL/min — ABNORMAL LOW (ref >60.00)
Glucose, Bld: 96 mg/dL (ref 70–99)
POTASSIUM: 4.9 meq/L (ref 3.5–5.1)
SODIUM: 141 meq/L (ref 135–145)
TOTAL PROTEIN: 6.1 g/dL (ref 6.0–8.3)
Total Bilirubin: 0.3 mg/dL (ref 0.2–1.2)

## 2014-10-30 LAB — VITAMIN D 25 HYDROXY (VIT D DEFICIENCY, FRACTURES): VITD: 30.63 ng/mL (ref 30.00–100.00)

## 2014-10-30 NOTE — Telephone Encounter (Signed)
-----   Message from Ellamae Sia sent at 10/24/2014  9:57 AM EDT ----- Regarding: Lab orders for Wednesday, 10.5.16 Patient is scheduled for CPX labs, please order future labs, Thanks , Karna Christmas

## 2014-11-05 ENCOUNTER — Ambulatory Visit (INDEPENDENT_AMBULATORY_CARE_PROVIDER_SITE_OTHER): Payer: BLUE CROSS/BLUE SHIELD | Admitting: Family Medicine

## 2014-11-05 ENCOUNTER — Encounter: Payer: Self-pay | Admitting: Family Medicine

## 2014-11-05 ENCOUNTER — Other Ambulatory Visit: Payer: Self-pay | Admitting: Family Medicine

## 2014-11-05 VITALS — BP 148/70 | HR 56 | Temp 97.5°F | Ht 69.5 in | Wt 263.5 lb

## 2014-11-05 DIAGNOSIS — Z0000 Encounter for general adult medical examination without abnormal findings: Secondary | ICD-10-CM

## 2014-11-05 DIAGNOSIS — N183 Chronic kidney disease, stage 3 unspecified: Secondary | ICD-10-CM

## 2014-11-05 DIAGNOSIS — Z23 Encounter for immunization: Secondary | ICD-10-CM

## 2014-11-05 DIAGNOSIS — D638 Anemia in other chronic diseases classified elsewhere: Secondary | ICD-10-CM

## 2014-11-05 DIAGNOSIS — I10 Essential (primary) hypertension: Secondary | ICD-10-CM | POA: Diagnosis not present

## 2014-11-05 DIAGNOSIS — E785 Hyperlipidemia, unspecified: Secondary | ICD-10-CM | POA: Diagnosis not present

## 2014-11-05 DIAGNOSIS — Z1159 Encounter for screening for other viral diseases: Secondary | ICD-10-CM

## 2014-11-05 NOTE — Assessment & Plan Note (Signed)
Well-controlled at home.  Continue current medication. 

## 2014-11-05 NOTE — Assessment & Plan Note (Signed)
Stable control. Avoiding NSAIDs.

## 2014-11-05 NOTE — Assessment & Plan Note (Signed)
Stable

## 2014-11-05 NOTE — Addendum Note (Signed)
Addended by: Carter Kitten on: 11/05/2014 08:30 AM   Modules accepted: Orders

## 2014-11-05 NOTE — Addendum Note (Signed)
Addended by: Daralene Milch C on: 11/05/2014 10:42 AM   Modules accepted: Orders, SmartSet

## 2014-11-05 NOTE — Assessment & Plan Note (Signed)
LDL almost at goal < 70 on zetia, red yeast rice and tricor. Continue working on healthy habits and weight loss, low chol diet. Add exercise.

## 2014-11-05 NOTE — Patient Instructions (Addendum)
Continue working on low cholesterol diet and start regular exercise. Set up colonoscopy as planned.  Stop at lab on way out for Hep C test.

## 2014-11-05 NOTE — Progress Notes (Signed)
The patient is here for annual wellness exam and preventative care.  Feeling well verall.  CKD stage 2 GFR 57, improved  this year cr 1.34  Avoiding NSAIDs He drinks moderate amount of water. He has been drinking a lot of water recently. No vomiting or diarrhea. No urine obstruction. No dysuria.No family history of kidney issues.  Hypertension:  On amlodipine and lisinopril because of recent creatinine increase. Using medication without problems or lightheadedness: None Chest pain with exertion:None Edema:None Short of breath:None Average home BPs: Since being off med 130/70s Other issues: CAD  BP Readings from Last 3 Encounters:  11/05/14 148/70  09/20/14 128/74  05/03/14 158/80   Wt Readings from Last 3 Encounters:  11/05/14 263 lb 8 oz (119.523 kg)  09/20/14 263 lb (119.296 kg)  05/03/14 263 lb 8 oz (119.523 kg)   Elevated Cholesterol: Almost  at goal LDL <70, no longer on crestor a week given mucsle cramps. On red yeast rice, zetia and tricor. Lab Results  Component Value Date   CHOL 150 10/30/2014   HDL 50.20 10/30/2014   LDLCALC 86 10/30/2014   TRIG 66.0 10/30/2014   CHOLHDL 3 10/30/2014  Using medications without problems:None  Muscle aches: None  Diet compliance:  Improving, working on low cholesterol diet. Exercise: walking some.  Anemia, no blood loss. Normal iron in 05/2013. Likely anemia of chronic disease.    Review of Systems  Constitutional: Negative for fever, fatigue and unexpected weight change.  HENT: Negative for ear pain, congestion, sore throat, rhinorrhea, trouble swallowing and postnasal drip.  Eyes: Negative for pain.  Respiratory: Negative for cough, shortness of breath and wheezing.  Cardiovascular: Negative for chest pain, palpitations and leg swelling.  Gastrointestinal: Positive for constipation. Negative for nausea, abdominal pain, diarrhea and blood in stool.  Genitourinary: Negative for dysuria, urgency,  hematuria, discharge, penile swelling, scrotal swelling, difficulty urinating, penile pain and testicular pain.  Musculoskeletal: Positive for arthralgias.  Skin: Negative for rash.  Neurological: Negative for syncope, weakness, light-headedness, numbness and headaches.  Psychiatric/Behavioral: Negative for behavioral problems and dysphoric mood. The patient is not nervous/anxious.  Objective:   Physical Exam  Constitutional: He appears well-developed and well-nourished. Non-toxic appearance. He does not appear ill. No distress.  Obese, central obesity  HENT:  Head: Normocephalic and atraumatic.  Right Ear: Hearing, tympanic membrane, external ear and ear canal normal.  Left Ear: Hearing, tympanic membrane, external ear and ear canal normal.  Nose: Nose normal.  Mouth/Throat: Uvula is midline, oropharynx is clear and moist and mucous membranes are normal.  Eyes: Conjunctivae, EOM and lids are normal. Pupils are equal, round, and reactive to light. No foreign bodies found.  Neck: Trachea normal, normal range of motion and phonation normal. Neck supple. Carotid bruit is not present. No mass and no thyromegaly present.  Cardiovascular: Normal rate, regular rhythm, S1 normal, S2 normal, intact distal pulses and normal pulses. Exam reveals no gallop.  No murmur heard.  Pulmonary/Chest: Breath sounds normal. He has no wheezes. He has no rhonchi. He has no rales.  Abdominal: Soft. Normal appearance and bowel sounds are normal. There is no hepatosplenomegaly. There is no tenderness. There is no rebound, no guarding and no CVA tenderness. No hernia. Hernia confirmed negative in the right inguinal area and confirmed negative in the left inguinal area.  Genitourinary: not indicated, pt asymptomatic Lymphadenopathy:  He has no cervical adenopathy.  Right: No inguinal adenopathy present.  Left: No inguinal adenopathy present.  Neurological: He is alert. He has  normal strength and normal reflexes. No cranial nerve deficit or sensory deficit. Gait normal.  Skin: Skin is warm, dry and intact. No rash noted.  Psychiatric: He has a normal mood and affect. His speech is normal and behavior is normal. Judgment normal.  Assessment & Plan:   The patient's preventative maintenance and recommended screening tests for an annual wellness exam were reviewed in full today.  Brought up to date unless services declined.  Counselled on the importance of diet, exercise, and its role in overall health and mortality.  The patient's FH and SH was reviewed, including their home life, tobacco status, and drug and alcohol status.    Stable PSA. Lab Results  Component Value Date   PSA 0.80 10/30/2014   PSA 0.30 10/25/2013   PSA 0.29 10/26/2012  Colon: ifob 2011, colonoscopy 2006 neg, repeat in 2016.  Vaccines: Now up to date with flu, prevnar, pneumovax and Tdap, consider shingles. Nonsmoker.  HEP C:

## 2014-11-05 NOTE — Progress Notes (Signed)
Pre visit review using our clinic review tool, if applicable. No additional management support is needed unless otherwise documented below in the visit note. 

## 2014-11-06 LAB — HEPATITIS C ANTIBODY: HCV AB: NEGATIVE

## 2014-11-07 ENCOUNTER — Encounter: Payer: Self-pay | Admitting: *Deleted

## 2014-12-21 ENCOUNTER — Other Ambulatory Visit: Payer: Self-pay | Admitting: Family Medicine

## 2014-12-22 NOTE — Telephone Encounter (Signed)
Last office visit 11/05/2014.  Last refilled 06/04/2014 for #60 with no refills.  Ok to refill?

## 2014-12-23 ENCOUNTER — Other Ambulatory Visit: Payer: Self-pay | Admitting: Cardiology

## 2015-02-27 ENCOUNTER — Other Ambulatory Visit: Payer: Self-pay | Admitting: Cardiology

## 2015-02-28 NOTE — Telephone Encounter (Signed)
Rx request sent to pharmacy.  

## 2015-04-08 ENCOUNTER — Telehealth: Payer: Self-pay | Admitting: *Deleted

## 2015-04-08 NOTE — Telephone Encounter (Signed)
He had an Echo in 2015, but has not had a Nuc since ~2011.   He has been asymptomatic.  Low Risk patient.  Leonie Man, MD

## 2015-04-08 NOTE — Telephone Encounter (Signed)
Request for surgical clearance:  1. What type of surgery is being performed?  CATARACT EXTRACTION WITH INTRAOCULAR LENS IMPLANTATION OF THE RIGHT EYE    2. When is this surgery scheduled? TBA  3. Are there any medications that need to be held prior to surgery and how long? ASPIRIN DOES NOT NEED TO STOP  4. Name of physician performing surgery?   DR Lowella Fairy [facility],OR [facility]  5. What is your office phone and fax number?  [phone],   FAX [phone]  ( NEED TO SEND EKG,ECHO,STRESS TEST  CATH REPORT,RECENT OFFICE VISIT) 6.

## 2015-04-09 NOTE — Telephone Encounter (Signed)
FAXED TELEPHONE ENCOUNTER ALONG WITH LAST EKG ,OFFICE NOTE TO  [facility]

## 2015-04-10 NOTE — Telephone Encounter (Signed)
SURGERY SCHEDULE FOR 05/02/2015

## 2015-05-26 ENCOUNTER — Other Ambulatory Visit: Payer: Self-pay | Admitting: Cardiology

## 2015-05-26 NOTE — Telephone Encounter (Signed)
Rx request sent to pharmacy.  

## 2015-06-26 ENCOUNTER — Telehealth: Payer: Self-pay | Admitting: *Deleted

## 2015-06-26 ENCOUNTER — Other Ambulatory Visit: Payer: Self-pay | Admitting: Cardiology

## 2015-06-26 NOTE — Telephone Encounter (Signed)
Received fax from [place] request PA for Depakote 250 mg DR Tablets.  PA completed on CoverMyMeds and approved.  [id number].  [place] notified.

## 2015-06-26 NOTE — Telephone Encounter (Signed)
Rx(s) sent to pharmacy electronically.  

## 2015-07-08 ENCOUNTER — Telehealth: Payer: Self-pay | Admitting: *Deleted

## 2015-07-08 NOTE — Telephone Encounter (Signed)
Sent  Information to [facility] DISCONTINUE TRICOR GENERIC  FENOFIBRATE START GENERIC LOFIBRA 160 MG  ONE TABLET DAILY

## 2015-07-09 MED ORDER — FENOFIBRATE 160 MG PO TABS: 160.0000 mg | ORAL_TABLET | Freq: Every day | ORAL | Status: DC

## 2015-07-09 NOTE — Telephone Encounter (Signed)
spok to patient   notified of the change of fenofibrate 145 mg to 160 mg Due insurance preference Verbalized understanding

## 2015-07-09 NOTE — Telephone Encounter (Signed)
Called notified  Pharmacy of change

## 2015-07-30 ENCOUNTER — Other Ambulatory Visit: Payer: Self-pay

## 2015-08-12 ENCOUNTER — Other Ambulatory Visit: Payer: Self-pay

## 2015-08-19 ENCOUNTER — Telehealth: Payer: Self-pay | Admitting: *Deleted

## 2015-08-19 MED ORDER — EZETIMIBE 10 MG PO TABS: 10.0000 mg | ORAL_TABLET | Freq: Every day | ORAL | 3 refills | Status: DC

## 2015-08-19 NOTE — Telephone Encounter (Signed)
Received fax from [employer], brand zetia is not approved on the pts plan. Spoke with pt, he has never tried generic zetia. He is willing to try the generic. New script sent to the pharmacy

## 2015-09-30 ENCOUNTER — Other Ambulatory Visit: Payer: Self-pay | Admitting: Cardiology

## 2015-10-27 ENCOUNTER — Telehealth: Payer: Self-pay | Admitting: Family Medicine

## 2015-10-27 DIAGNOSIS — Z125 Encounter for screening for malignant neoplasm of prostate: Secondary | ICD-10-CM

## 2015-10-27 DIAGNOSIS — D638 Anemia in other chronic diseases classified elsewhere: Secondary | ICD-10-CM

## 2015-10-27 DIAGNOSIS — E785 Hyperlipidemia, unspecified: Secondary | ICD-10-CM

## 2015-10-27 NOTE — Telephone Encounter (Signed)
-----   Message from Eustace Pen, LPN sent at [phone]  4:39 PM EDT ----- Regarding: Lab Orders for 10/27/15 Please place lab orders. Thank you.

## 2015-10-28 ENCOUNTER — Ambulatory Visit: Payer: BLUE CROSS/BLUE SHIELD

## 2015-10-29 ENCOUNTER — Other Ambulatory Visit (INDEPENDENT_AMBULATORY_CARE_PROVIDER_SITE_OTHER): Payer: 59

## 2015-10-29 ENCOUNTER — Other Ambulatory Visit: Payer: Self-pay | Admitting: Cardiology

## 2015-10-29 DIAGNOSIS — E785 Hyperlipidemia, unspecified: Secondary | ICD-10-CM

## 2015-10-29 DIAGNOSIS — D638 Anemia in other chronic diseases classified elsewhere: Secondary | ICD-10-CM

## 2015-10-29 DIAGNOSIS — Z125 Encounter for screening for malignant neoplasm of prostate: Secondary | ICD-10-CM | POA: Diagnosis not present

## 2015-10-29 LAB — COMPREHENSIVE METABOLIC PANEL
ALBUMIN: 3.9 g/dL (ref 3.5–5.2)
ALT: 21 U/L (ref 0–53)
AST: 21 U/L (ref 0–37)
Alkaline Phosphatase: 39 U/L (ref 39–117)
BILIRUBIN TOTAL: 0.4 mg/dL (ref 0.2–1.2)
BUN: 37 mg/dL — AB (ref 6–23)
CO2: 28 meq/L (ref 19–32)
CREATININE: 1.43 mg/dL (ref 0.40–1.50)
Calcium: 9.3 mg/dL (ref 8.4–10.5)
Chloride: 107 mEq/L (ref 96–112)
GFR: 52.13 mL/min — ABNORMAL LOW (ref >60.00)
Glucose, Bld: 91 mg/dL (ref 70–99)
Potassium: 5.3 mEq/L — ABNORMAL HIGH (ref 3.5–5.1)
SODIUM: 141 meq/L (ref 135–145)
Total Protein: 6.2 g/dL (ref 6.0–8.3)

## 2015-10-29 LAB — LIPID PANEL
CHOLESTEROL: 154 mg/dL (ref 0–200)
HDL: 52.2 mg/dL (ref >39.00)
LDL Cholesterol: 84 mg/dL (ref 0–99)
NonHDL: 101.42
Total CHOL/HDL Ratio: 3
Triglycerides: 86 mg/dL (ref 0.0–149.0)
VLDL: 17.2 mg/dL (ref 0.0–40.0)

## 2015-10-29 LAB — CBC WITH DIFFERENTIAL/PLATELET
BASOS ABS: 0 10*3/uL (ref 0.0–0.1)
BASOS PCT: 0.4 % (ref 0.0–3.0)
EOS ABS: 0.2 10*3/uL (ref 0.0–0.7)
Eosinophils Relative: 3.4 % (ref 0.0–5.0)
HEMATOCRIT: 36.4 % — AB (ref 39.0–52.0)
Hemoglobin: 12.4 g/dL — ABNORMAL LOW (ref 13.0–17.0)
LYMPHS PCT: 50.2 % — AB (ref 12.0–46.0)
Lymphs Abs: 2.4 10*3/uL (ref 0.7–4.0)
MCHC: 34 g/dL (ref 30.0–36.0)
MCV: 90.6 fl (ref 78.0–100.0)
MONO ABS: 0.3 10*3/uL (ref 0.1–1.0)
Monocytes Relative: 7.1 % (ref 3.0–12.0)
NEUTROS ABS: 1.9 10*3/uL (ref 1.4–7.7)
NEUTROS PCT: 38.9 % — AB (ref 43.0–77.0)
PLATELETS: 205 10*3/uL (ref 150.0–400.0)
RBC: 4.02 Mil/uL — ABNORMAL LOW (ref 4.22–5.81)
RDW: 13.9 % (ref 11.5–15.5)
WBC: 4.9 10*3/uL (ref 4.0–10.5)

## 2015-10-29 LAB — PSA, MEDICARE: PSA: 0.32 ng/ml (ref 0.10–4.00)

## 2015-11-07 ENCOUNTER — Telehealth: Payer: Self-pay | Admitting: *Deleted

## 2015-11-07 NOTE — Telephone Encounter (Signed)
Received fax from [facility] requesting PA for Depakote DR 250 mg.  PA completed on CoverMyMeds.  PA under review.  Can take up to 72 hours for response.

## 2015-11-10 NOTE — Telephone Encounter (Signed)
Received fax back from [employer] stating medication was previously approved on [id number] from 06/26/2015 to 06/25/2016.  [facility] notified via fax.

## 2015-11-11 ENCOUNTER — Telehealth: Payer: Self-pay | Admitting: Family Medicine

## 2015-11-11 ENCOUNTER — Encounter: Payer: Self-pay | Admitting: Family Medicine

## 2015-11-11 ENCOUNTER — Ambulatory Visit (INDEPENDENT_AMBULATORY_CARE_PROVIDER_SITE_OTHER): Payer: 59 | Admitting: Family Medicine

## 2015-11-11 VITALS — BP 150/68 | HR 45 | Temp 97.7°F | Ht 69.25 in | Wt 263.0 lb

## 2015-11-11 DIAGNOSIS — D638 Anemia in other chronic diseases classified elsewhere: Secondary | ICD-10-CM

## 2015-11-11 DIAGNOSIS — I2510 Atherosclerotic heart disease of native coronary artery without angina pectoris: Secondary | ICD-10-CM

## 2015-11-11 DIAGNOSIS — I10 Essential (primary) hypertension: Secondary | ICD-10-CM

## 2015-11-11 DIAGNOSIS — E875 Hyperkalemia: Secondary | ICD-10-CM

## 2015-11-11 DIAGNOSIS — N183 Chronic kidney disease, stage 3 unspecified: Secondary | ICD-10-CM

## 2015-11-11 DIAGNOSIS — Z23 Encounter for immunization: Secondary | ICD-10-CM | POA: Diagnosis not present

## 2015-11-11 DIAGNOSIS — Z0000 Encounter for general adult medical examination without abnormal findings: Secondary | ICD-10-CM | POA: Diagnosis not present

## 2015-11-11 DIAGNOSIS — E785 Hyperlipidemia, unspecified: Secondary | ICD-10-CM | POA: Diagnosis not present

## 2015-11-11 DIAGNOSIS — Z9861 Coronary angioplasty status: Secondary | ICD-10-CM

## 2015-11-11 LAB — BASIC METABOLIC PANEL
BUN: 38 mg/dL — ABNORMAL HIGH (ref 6–23)
CALCIUM: 9.9 mg/dL (ref 8.4–10.5)
CO2: 27 meq/L (ref 19–32)
CREATININE: 1.51 mg/dL — AB (ref 0.40–1.50)
Chloride: 107 mEq/L (ref 96–112)
GFR: 48.95 mL/min — ABNORMAL LOW (ref >60.00)
Glucose, Bld: 99 mg/dL (ref 70–99)
Potassium: 4.5 mEq/L (ref 3.5–5.1)
SODIUM: 140 meq/L (ref 135–145)

## 2015-11-11 NOTE — Assessment & Plan Note (Signed)
Likely secondary to CKD, ACE I and diet. Stop multivitamin.  Re-check today.

## 2015-11-11 NOTE — Addendum Note (Signed)
Addended by: Emelia Salisbury C on: 11/11/2015 09:35 AM   Modules accepted: Orders

## 2015-11-11 NOTE — Assessment & Plan Note (Signed)
Improved control. 

## 2015-11-11 NOTE — Progress Notes (Signed)
The patient is here for annual wellness exam and preventative care.   The patients weight, height, BMI and visual acuity have been recorded in the chart  I have made referrals, counseling and provided education to the patient based review of the above and I have provided the pt with a written personalized care plan for preventive services.   Feeling well overall.  CKD stage 3 GFR 52,  cr 1.43  Avoiding NSAIDs He drinks moderate amount of water. . No vomiting or diarrhea. No urine obstruction. No dysuria.No family history of kidney issues.  Hypertension:  Elevated in office today, he has been running around this AM. On amlodipine and lisinopril.   BP Readings from Last 3 Encounters:  11/11/15 (!) 150/68  11/05/14 (!) 148/70  09/20/14 128/74  Using medication without problems or lightheadedness: None Chest pain with exertion:None Edema:None Short of breath:None Average home BPs:  130/70s at home. Other issues: CAD  Wt Readings from Last 3 Encounters:  11/11/15 263 lb (119.3 kg)  11/05/14 263 lb 8 oz (119.5 kg)  09/20/14 263 lb (119.3 kg)   Elevated Cholesterol: Almost  at goal LDL <70, no longer on crestor a week given mucsle cramps. On red yeast rice, zetia and tricor. Lab Results  Component Value Date   CHOL 154 10/29/2015   HDL 52.20 10/29/2015   LDLCALC 84 10/29/2015   TRIG 86.0 10/29/2015   CHOLHDL 3 10/29/2015   Using medications without problems:None  Muscle aches: None  Diet compliance:  Improving, working on low cholesterol diet. Exercise: walking some.  Anemia, of chronic disease, improved in last year.  Social History /Family History/Past Medical History reviewed and updated if needed.  Review of Systems  Constitutional: Negative for fever, fatigue and unexpected weight change.  HENT: Negative for ear pain, congestion, sore throat, rhinorrhea, trouble swallowing and postnasal drip.  Eyes: Negative for pain.  Respiratory:  Negative for cough, shortness of breath and wheezing.  Cardiovascular: Negative for chest pain, palpitations and leg swelling.  Gastrointestinal: Positive for constipation. Negative for nausea, abdominal pain, diarrhea and blood in stool.  Genitourinary: Negative for dysuria, urgency, hematuria, discharge, penile swelling, scrotal swelling, difficulty urinating, penile pain and testicular pain.  Musculoskeletal: Positive for arthralgias.  Skin: Negative for rash.  Neurological: Negative for syncope, weakness, light-headedness, numbness and headaches.  Psychiatric/Behavioral: Negative for behavioral problems and dysphoric mood. The patient is not nervous/anxious.  Objective:   Physical Exam  Constitutional: He appears well-developed and well-nourished. Non-toxic appearance. He does not appear ill. No distress.  Obese, central obesity  HENT:  Head: Normocephalic and atraumatic.  Right Ear: Hearing, tympanic membrane, external ear and ear canal normal.  Left Ear: Hearing, tympanic membrane, external ear and ear canal normal.  Nose: Nose normal.  Mouth/Throat: Uvula is midline, oropharynx is clear and moist and mucous membranes are normal.  Eyes: Conjunctivae, EOM and lids are normal. Pupils are equal, round, and reactive to light. No foreign bodies found.  Neck: Trachea normal, normal range of motion and phonation normal. Neck supple. Carotid bruit is not present. No mass and no thyromegaly present.  Cardiovascular: Normal rate, regular rhythm, S1 normal, S2 normal, intact distal pulses and normal pulses. Exam reveals no gallop.  No murmur heard.  Pulmonary/Chest: Breath sounds normal. He has no wheezes. He has no rhonchi. He has no rales.  Abdominal: Soft. Normal appearance and bowel sounds are normal. There is no hepatosplenomegaly. There is no tenderness. There is no rebound, no guarding and no CVA tenderness.  No hernia. Hernia confirmed negative in  the right inguinal area and confirmed negative in the left inguinal area.  Genitourinary: not indicated, pt asymptomatic Lymphadenopathy:  He has no cervical adenopathy.  Right: No inguinal adenopathy present.  Left: No inguinal adenopathy present.  Neurological: He is alert. He has normal strength and normal reflexes. No cranial nerve deficit or sensory deficit. Gait normal.  Skin: Skin is warm, dry and intact. No rash noted.  Psychiatric: He has a normal mood and affect. His speech is normal and behavior is normal. Judgment normal.  Assessment & Plan:   The patient's preventative maintenance and recommended screening tests for an annual wellness exam were reviewed in full today.  Brought up to date unless services declined.  Counselled on the importance of diet, exercise, and its role in overall health and mortality.  The patient's FH and SH was reviewed, including their home life, tobacco status, and drug and alcohol status.    Stable PSA. Lab Results  Component Value Date   PSA 0.32 10/29/2015   PSA 0.80 10/30/2014   PSA 0.30 10/25/2013   Colon: ifob 2011, colonoscopy 2006 neg,   Call to schedule. Vaccines: Now up to date prevnar, pneumovax and Tdap, consider shingles. Given flu shot today. Nonsmoker.  HEP C: neg

## 2015-11-11 NOTE — Assessment & Plan Note (Signed)
Elevated today. Pt will follow at home given CKD and need for improved control.. Will call if elevated on current regimen.

## 2015-11-11 NOTE — Assessment & Plan Note (Signed)
Asymptomatic at this time. Needs better lipid control.

## 2015-11-11 NOTE — Progress Notes (Signed)
Pre visit review using our clinic review tool, if applicable. No additional management support is needed unless otherwise documented below in the visit note. 

## 2015-11-11 NOTE — Patient Instructions (Addendum)
Follow BP at home.. Goal < 140/90. Call if they are elevated consistently given kidney issues.  Stop at lab on way out to re-eval potassium.  Work on increasing exercise.  Increase red yeast rice to 2 tab twice daily if tolerated.  Call to schedule colonoscopy.

## 2015-11-11 NOTE — Assessment & Plan Note (Addendum)
Slight worsening. Discussed referral to kidney specialist. Pt refuses at this time. Encourage NSAID avoidance, increase water intake, improved BP control.

## 2015-11-11 NOTE — Assessment & Plan Note (Signed)
Almost at goal . INcrease red yeast rice to  2 tab BID if tolerated. Pt does already have some cramps at lower dose.

## 2015-11-11 NOTE — Telephone Encounter (Signed)
-----   Message from Carter Kitten, [place] sent at 11/11/2015  1:14 PM EDT ----- Mr. Reitmeier notified as instructed by telephone.  He is agreeable to the referral to nephrology.

## 2015-11-26 ENCOUNTER — Other Ambulatory Visit: Payer: Self-pay

## 2015-11-26 MED ORDER — FENOFIBRATE 160 MG PO TABS: 160.0000 mg | ORAL_TABLET | Freq: Every day | ORAL | 0 refills | Status: DC

## 2015-11-28 ENCOUNTER — Other Ambulatory Visit: Payer: Self-pay | Admitting: Cardiology

## 2015-12-01 ENCOUNTER — Other Ambulatory Visit: Payer: Self-pay | Admitting: Nephrology

## 2015-12-01 DIAGNOSIS — N181 Chronic kidney disease, stage 1: Secondary | ICD-10-CM

## 2015-12-03 ENCOUNTER — Ambulatory Visit
Admission: RE | Admit: 2015-12-03 | Discharge: 2015-12-03 | Disposition: A | Discharge status: Home / Self Care | Payer: 59 | Source: Ambulatory Visit | Attending: Nephrology | Admitting: Nephrology

## 2015-12-03 DIAGNOSIS — N181 Chronic kidney disease, stage 1: Secondary | ICD-10-CM

## 2015-12-11 LAB — HM COLONOSCOPY

## 2015-12-16 ENCOUNTER — Encounter: Payer: Self-pay | Admitting: Family Medicine

## 2015-12-30 ENCOUNTER — Other Ambulatory Visit: Payer: Self-pay | Admitting: Family Medicine

## 2015-12-31 ENCOUNTER — Other Ambulatory Visit: Payer: 59

## 2015-12-31 NOTE — Telephone Encounter (Signed)
Okay to refill, but he needs to come in for valproic acid level ASAP prior

## 2015-12-31 NOTE — Telephone Encounter (Signed)
Spoke with Mr. Belyeu.  He will come in today after lunch to have a valproic acid drawn.

## 2015-12-31 NOTE — Telephone Encounter (Signed)
Last office visit 11/11/2015.  Last refilled 12/23/2014 for #60 with 11 refills. Last Valproic Acid Level 10/26/2012.  Ok to refill?

## 2016-01-01 ENCOUNTER — Other Ambulatory Visit (INDEPENDENT_AMBULATORY_CARE_PROVIDER_SITE_OTHER): Payer: 59

## 2016-01-01 DIAGNOSIS — G40909 Epilepsy, unspecified, not intractable, without status epilepticus: Secondary | ICD-10-CM

## 2016-01-02 LAB — VALPROIC ACID LEVEL: VALPROIC ACID LVL: 34.7 ug/mL — AB (ref 50.0–100.0)

## 2016-01-19 ENCOUNTER — Other Ambulatory Visit: Payer: Self-pay | Admitting: Cardiology

## 2016-01-20 NOTE — Telephone Encounter (Signed)
REFILL 

## 2016-02-26 ENCOUNTER — Other Ambulatory Visit: Payer: Self-pay | Admitting: Cardiology

## 2016-03-03 DIAGNOSIS — I10 Essential (primary) hypertension: Secondary | ICD-10-CM | POA: Diagnosis not present

## 2016-03-03 DIAGNOSIS — N183 Chronic kidney disease, stage 3 (moderate): Secondary | ICD-10-CM | POA: Diagnosis not present

## 2016-03-03 DIAGNOSIS — E782 Mixed hyperlipidemia: Secondary | ICD-10-CM | POA: Diagnosis not present

## 2016-03-03 DIAGNOSIS — I2510 Atherosclerotic heart disease of native coronary artery without angina pectoris: Secondary | ICD-10-CM | POA: Diagnosis not present

## 2016-03-03 DIAGNOSIS — Z789 Other specified health status: Secondary | ICD-10-CM | POA: Diagnosis not present

## 2016-03-03 DIAGNOSIS — R001 Bradycardia, unspecified: Secondary | ICD-10-CM | POA: Diagnosis not present

## 2016-03-10 ENCOUNTER — Telehealth: Payer: Self-pay

## 2016-03-10 MED ORDER — EZETIMIBE 10 MG PO TABS: 10.0000 mg | ORAL_TABLET | Freq: Every day | ORAL | 0 refills | Status: DC

## 2016-03-10 NOTE — Telephone Encounter (Signed)
Spoke to patient's wife.Zetia refill sent to pharmacy.Advised patient needs follow up appointment.Appointment scheduled with Almyra Deforest PA 03/15/16 at 10:00 am.Advised to come fasting so he can have lipid panel done too.

## 2016-03-15 ENCOUNTER — Ambulatory Visit (INDEPENDENT_AMBULATORY_CARE_PROVIDER_SITE_OTHER): Payer: 59 | Admitting: Physician Assistant

## 2016-03-15 ENCOUNTER — Encounter: Payer: Self-pay | Admitting: Physician Assistant

## 2016-03-15 VITALS — BP 152/87 | HR 50 | Ht 71.0 in | Wt 267.0 lb

## 2016-03-15 DIAGNOSIS — Z8679 Personal history of other diseases of the circulatory system: Secondary | ICD-10-CM | POA: Diagnosis not present

## 2016-03-15 DIAGNOSIS — Z9861 Coronary angioplasty status: Secondary | ICD-10-CM | POA: Diagnosis not present

## 2016-03-15 DIAGNOSIS — I2510 Atherosclerotic heart disease of native coronary artery without angina pectoris: Secondary | ICD-10-CM

## 2016-03-15 DIAGNOSIS — Z87898 Personal history of other specified conditions: Secondary | ICD-10-CM | POA: Diagnosis not present

## 2016-03-15 DIAGNOSIS — E785 Hyperlipidemia, unspecified: Secondary | ICD-10-CM | POA: Diagnosis not present

## 2016-03-15 DIAGNOSIS — I10 Essential (primary) hypertension: Secondary | ICD-10-CM

## 2016-03-15 DIAGNOSIS — N183 Chronic kidney disease, stage 3 unspecified: Secondary | ICD-10-CM

## 2016-03-15 NOTE — Patient Instructions (Signed)
Medication Instructions:  Your physician recommends that you continue on your current medications as directed. Please refer to the Current Medication list given to you today.  Labwork: NONE  Testing/Procedures: NONE  Follow-Up: Your physician wants you to follow-up in: [place] DR. HARDING. You will receive a reminder letter in the mail two months in advance. If you don't receive a letter, please call our office to schedule the follow-up appointment.   Any Other Special Instructions Will Be Listed Below (If Applicable).     If you need a refill on your cardiac medications before your next appointment, please call your pharmacy.

## 2016-03-15 NOTE — Progress Notes (Signed)
Cardiology Office Note    Date:  03/15/2016   ID:  Joshua Stark, DOB 04-Dec-1945, MRN [id number]  PCP:  Eliezer Lofts, MD  Cardiologist:  Dr. Ellyn Hack Nephrologist: Dr. Jamal Maes with [facility]  Chief Complaint  Patient presents with  . Follow-up    seen for Dr. Ellyn Hack.     History of Present Illness:  Joshua Stark is a 71 y.o. male with PMH of single-vessel CAD, hypertension, hyperlipidemia, CKD stage III and history of seizure disorder. He had a history of CAD dating back to 1999 when he underwent PCI of LAD. He also had low risk Myoview in 2012. The last echocardiogram obtained on 09/20/2013 showed EF 60-65%, mild AI. He is not on a beta blocker due to baseline bradycardia. The last office visit was on 09/20/2014 at which time he was doing well. He was intolerant to statins (tried pravachol, lipitor and crestor), therefore he was recommended to go back on Red Yeast Rice along with TriCor and Zetia. He was seen by Dr. Samella Parr of [facility] on 01/01/2016, establish the degree of his bradycardia, Dr. Alethia Berthold recommended a Holter monitor. The monitor showed sinus bradycardia and sinus tachycardia. He has frequent PVCs and occasional runs of SVT. She has a episode of 6 beat SVT run with heart rate of 137 bpm. Otherwise, he did not have any prolonged sinus pauses greater than 2.0 seconds. Last lipid panel obtained on 11/26/2015 showed total cholesterol 157, LDL 89, HDL 49, triglycerides 96. Recently obtained hemoglobin A1c on 01/01/2016 was 5.6.   Since the last time we saw him, he has came off of fenofibrate due to muscle pain. He is still on Zetia, Red Yeast Rice extract and Fish oil. Otherwise he has been doing well without any significant chest discomfort and his blood pressure has been somewhat elevated in the past few visit. He says between the lisinopril 40 mg and amlodipine 5 mg, he take one of them in the morning and one of them and night. He does not remember which  one he take in the morning. He did not take any morning medication today. Also his PCP added eplerenone 25 mg during the previous visit. He did not take that medication today either. I will hold off on uptitrating his amlodipine medication since he did not take any blood pressure medication this morning. The elevated BP is off antihypertensive. He is scheduled to see Dr. Alethia Berthold again very soon for further medication titration. I think he is doing well from cardiology perspective and he can follow-up in one year.   Past Medical History:  Diagnosis Date  . CAD S/P percutaneous coronary angioplasty 1999   PCI- prox LAD @ D1, SP1 trifurcation.  3.0 mm x 16 mm AVE-GFX BMS  . History of nuclear stress test August 2012   Treadmill Myoview: 9 minutes, 10 METS --> hypertensive response.  No evidence of ischemia or infarction.  EF 64%  . HLD (hyperlipidemia)   . Hypertension   . Obesity (BMI 30-39.9)   . Seizure disorder [facility])     Past Surgical History:  Procedure Laterality Date  . CARDIAC CATHETERIZATION  01/20/1998  . CORONARY ANGIOPLASTY  01/20/1998   PCI to LAD ,prox LAD at trifurcation for D1and SP1.3.0-x16-mm AVE-GFX bare-metal stent  . NM MYOCAR PERF WALL MOTION  Aug 2 ,2012   treadmill myoview exercised 9 minutes, reacing 10 metaboblic equivlents,had ahypertensive reponse .EF64%  . TRANSTHORACIC ECHOCARDIOGRAM  09/20/2013   normal LV size  and function. EF 60-65%. Normal diastolic function. Mild LA dilation and mild AI.  No evidence of aortic stenosis. No significant MR.    Current Medications: Outpatient Medications Prior to Visit  Medication Sig Dispense Refill  . acetaminophen (TYLENOL) 500 MG tablet Take 500 mg by mouth as needed for pain.    Marland Kitchen amLODipine (NORVASC) 5 MG tablet TAKE 1 TABLET BY MOUTH DAILY 30 tablet 0  . aspirin 81 MG tablet Take 81 mg by mouth daily.      Marland Kitchen DEPAKOTE 250 MG DR tablet TAKE 1 TABLET BY MOUTH TWICE A DAY 60 tablet 5  . fenofibrate 160 MG tablet TAKE 1  TABLET BY MOUTH  DAILY 90 tablet 0  . fish oil-omega-3 fatty acids 1000 MG capsule Take 2 g by mouth daily.      . Garlic XX123456 MG TABS Take by mouth.    . Glucosamine-Chondroitin (OSTEO BI-FLEX REGULAR STRENGTH PO) Take by mouth.    Marland Kitchen lisinopril (PRINIVIL,ZESTRIL) 40 MG tablet TAKE 1 TABLET BY MOUTH DAILY 30 tablet 0  . Multiple Vitamin (MULTIVITAMIN) capsule Take 1 capsule by mouth daily.      Marland Kitchen NITROSTAT 0.4 MG SL tablet DISSOLVE 1 TABLET UNDER THE TONGUE FOR CHEST PAIN. MAY REPEAT EVERY 5MINUTES UP TO 3 DOSES. IF NO RELIEF, CALL 911** 25 tablet 2  . OVER THE COUNTER MEDICATION Take 2 tablet cherry pill a day    . Red Yeast Rice Extract (RED YEAST RICE PO) Take by mouth.    . Sennosides (SENOKOT PO) Take by mouth.    . ezetimibe (ZETIA) 10 MG tablet Take 1 tablet (10 mg total) by mouth daily. (Patient not taking: Reported on 03/15/2016) 30 tablet 0   No facility-administered medications prior to visit.      Allergies:   Fenofibrate; Rosuvastatin; Pravastatin sodium; and Statins   Social History   Social History  . Marital status: Married    Spouse name: N/A  . Number of children: 2  . Years of education: N/A   Occupational History  .     Social History Main Topics  . Smoking status: Never Smoker  . Smokeless tobacco: Never Used  . Alcohol use No  . Drug use: No  . Sexual activity: Not Asked   Other Topics Concern  . None   Social History Narrative   He is a married father of 2, grandfather 24.   He usually walks 5 days a week.   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  He does a lot of activity and strenuous labor with this as well.   He never smoked, and does not drink alcohol.     Family History:  The patient's family history includes Cancer in his father; Hypertension in his mother.   ROS:   Please see the history of present illness.    ROS All other systems reviewed and are negative.   PHYSICAL EXAM:   VS:  BP (!) 152/87 (BP  Location: Right Arm, Patient Position: Sitting, Cuff Size: Normal)   Pulse (!) 50   Ht 5\' 11"  (1.803 m)   Wt 267 lb (121.1 kg)   BMI 37.24 kg/m    GEN: Well nourished, well developed, in no acute distress  HEENT: normal  Neck: no JVD, carotid bruits, or masses Cardiac: RRR; no murmurs, rubs, or gallops,no edema  Respiratory:  clear to auscultation bilaterally, normal work of breathing GI: soft, nontender, nondistended, + BS MS: no deformity  or atrophy  Skin: warm and dry, no rash Neuro:  Alert and Oriented x 3, Strength and sensation are intact Psych: euthymic mood, full affect  Wt Readings from Last 3 Encounters:  03/15/16 267 lb (121.1 kg)  11/11/15 263 lb (119.3 kg)  11/05/14 263 lb 8 oz (119.5 kg)      Studies/Labs Reviewed:   EKG:  EKG is ordered today.  The ekg ordered today demonstrates Sinus bradycardia with heart rate 50, no significant ST-T wave changes.  Recent Labs: 10/29/2015: ALT 21; Hemoglobin 12.4; Platelets 205.0 11/11/2015: BUN 38; Creatinine, Ser 1.51; Potassium 4.5; Sodium 140   Lipid Panel    Component Value Date/Time   CHOL 154 10/29/2015 0836   TRIG 86.0 10/29/2015 0836   HDL 52.20 10/29/2015 0836   CHOLHDL 3 10/29/2015 0836   VLDL 17.2 10/29/2015 0836   LDLCALC 84 10/29/2015 0836    Additional studies/ records that were reviewed today include:   Cath 01/20/1998      Myoview 08/27/2010   Echo 09/20/2013 LV EF: 60% -  65%  - Left ventricle: The cavity size was normal. Wall thickness was normal. Systolic function was normal. The estimated ejection fraction was in the range of 60% to 65%. Left ventricular diastolic function parameters were normal. - Aortic valve: There was mild regurgitation. - Left atrium: The atrium was mildly dilated.   ASSESSMENT:    1. CAD S/P percutaneous coronary angioplasty   2. H/O sinus bradycardia   3. Essential hypertension   4. Hyperlipidemia, unspecified hyperlipidemia type   5. H/O idiopathic  seizure   6. CKD (chronic kidney disease), stage III      PLAN:  In order of problems listed above:  1. CAD: No obvious angina.  2. H/o sinus bradycardia: Head Holter monitor placed by Dr. Alethia Berthold in December, episodic SVT and also bradycardia but without any significant sinus pulse. Patient has been asymptomatic. Would continue to avoid any AV nodal blocking agent. No obvious indication for EP referral at this point. If he does become symptomatic one day, then would consider EP workup and possibly pacemaker.  3. HTN: Continue lisinopril 40 mg daily, amlodipine 5 mg daily. eplerenone 25mg  daily added by Dr. Alethia Berthold. Blood pressure is elevated today, however he did not take any of the blood pressure medication this morning. He is scheduled to see Dr. Alethia Berthold soon.    4. HLD: On Zetia, fish oil and Red Yeast Rice. Fenofibrate has been discontinued due to muscle ache.  5. H/o seizure: Reported by patient, he is on Depakote and has not had any recurrent seizure for the past several years.    Medication Adjustments/Labs and Tests Ordered: Current medicines are reviewed at length with the patient today.  Concerns regarding medicines are outlined above.  Medication changes, Labs and Tests ordered today are listed in the Patient Instructions below. Patient Instructions  Medication Instructions:  Your physician recommends that you continue on your current medications as directed. Please refer to the Current Medication list given to you today.  Labwork: NONE  Testing/Procedures: NONE  Follow-Up: Your physician wants you to follow-up in: [place] DR. [facility]. You will receive a reminder letter in the mail two months in advance. If you don't receive a letter, please call our office to schedule the follow-up appointment.   Any Other Special Instructions Will Be Listed Below (If Applicable).     If you need a refill on your cardiac medications before your next appointment, please call your  pharmacy.      Hilbert Corrigan, [place]  03/15/2016 4:51 PM    [facility] [address], [place], [place]  [address] Phone: [phone]; Fax: [phone]

## 2016-03-17 DIAGNOSIS — N183 Chronic kidney disease, stage 3 (moderate): Secondary | ICD-10-CM | POA: Diagnosis not present

## 2016-03-29 ENCOUNTER — Other Ambulatory Visit: Payer: Self-pay | Admitting: Cardiology

## 2016-06-09 DIAGNOSIS — I10 Essential (primary) hypertension: Secondary | ICD-10-CM | POA: Diagnosis not present

## 2016-06-09 DIAGNOSIS — E782 Mixed hyperlipidemia: Secondary | ICD-10-CM | POA: Diagnosis not present

## 2016-06-09 DIAGNOSIS — R001 Bradycardia, unspecified: Secondary | ICD-10-CM | POA: Diagnosis not present

## 2016-06-09 DIAGNOSIS — N183 Chronic kidney disease, stage 3 (moderate): Secondary | ICD-10-CM | POA: Diagnosis not present

## 2016-06-09 DIAGNOSIS — I2510 Atherosclerotic heart disease of native coronary artery without angina pectoris: Secondary | ICD-10-CM | POA: Diagnosis not present

## 2016-06-09 DIAGNOSIS — Z789 Other specified health status: Secondary | ICD-10-CM | POA: Diagnosis not present

## 2016-06-17 DIAGNOSIS — Z5181 Encounter for therapeutic drug level monitoring: Secondary | ICD-10-CM | POA: Diagnosis not present

## 2016-06-17 DIAGNOSIS — I2510 Atherosclerotic heart disease of native coronary artery without angina pectoris: Secondary | ICD-10-CM | POA: Diagnosis not present

## 2016-06-17 DIAGNOSIS — Z79899 Other long term (current) drug therapy: Secondary | ICD-10-CM | POA: Diagnosis not present

## 2016-06-17 DIAGNOSIS — I10 Essential (primary) hypertension: Secondary | ICD-10-CM | POA: Diagnosis not present

## 2016-07-01 ENCOUNTER — Other Ambulatory Visit: Payer: Self-pay | Admitting: Family Medicine

## 2016-07-02 ENCOUNTER — Telehealth: Payer: Self-pay | Admitting: *Deleted

## 2016-07-02 NOTE — Telephone Encounter (Signed)
Last office visit 11/11/2015.  Last Depakote Level 01/01/2016.  Last refilled 12/31/2015 for #60 with 5 refills.  Ok to refill?

## 2016-07-02 NOTE — Telephone Encounter (Signed)
Received fax from [facility] requesting PA for Depakote.  PA completed on CoverMyMeds.    DEPAKOTE TAB 250MG  DR is approved through 07/02/2017.  [facility] notified of approval via fax.

## 2016-07-21 DIAGNOSIS — M170 Bilateral primary osteoarthritis of knee: Secondary | ICD-10-CM | POA: Diagnosis not present

## 2016-07-21 DIAGNOSIS — G8929 Other chronic pain: Secondary | ICD-10-CM | POA: Diagnosis not present

## 2016-07-21 DIAGNOSIS — M25562 Pain in left knee: Secondary | ICD-10-CM | POA: Diagnosis not present

## 2016-07-23 ENCOUNTER — Telehealth: Payer: Self-pay

## 2016-07-23 NOTE — Telephone Encounter (Signed)
Patient is on the list for [employer] 2018 and may be a good candidate for an AWV. Please let me know if/when appt is scheduled.   

## 2016-07-26 ENCOUNTER — Telehealth: Payer: Self-pay | Admitting: Family Medicine

## 2016-07-26 NOTE — Telephone Encounter (Signed)
Scheduled 11/03/16 °

## 2016-07-26 NOTE — Telephone Encounter (Signed)
Patient dropped off a Surgery Clearance form to be filled out.  Form was placed in the PCP's prescription incoming box.

## 2016-07-26 NOTE — Telephone Encounter (Signed)
Surgical  Clearance Form placed in Dr. Rometta Emery in box to complete.

## 2016-07-27 ENCOUNTER — Telehealth: Payer: Self-pay | Admitting: *Deleted

## 2016-07-27 NOTE — Telephone Encounter (Signed)
Spoke to patient. Informed received pre op clearance form that patient left at the office. informed patient may need an update appointment for  Clearance  Patient states he is not having chest pain or discomfort. Last visit was 03/15/2016 with extender. Patient aware will discuss with extender and/or Dr Ellyn Hack. Patient verbalized understanding and states if he needs to come in let him know.

## 2016-07-27 NOTE — Telephone Encounter (Signed)
I have called the patient and reviewed his chart. Last myoview in 2015, no ischemia on perfusion, but did have EKG changes. I recommended a stress test. Patient says his other cardiologist at [facility], Dr. Samella Parr recommended the same thing and already ordered the stress test. We do agree with Dr. Doristine Devoid plan and will defer to Dr. Alethia Berthold for cardiac clearance.

## 2016-07-27 NOTE — Telephone Encounter (Signed)
Reviewed /deferred to [place] PA

## 2016-07-30 NOTE — Telephone Encounter (Signed)
Given history of CAd pt needs clearance frpom his cardiology.. They can complete form. Form in outbox.

## 2016-08-02 NOTE — Telephone Encounter (Signed)
Form faxed to Fabio Asa at [facility] advising patient needs clearance from cardiologist prior to surgery given his history of CAD per Dr. Diona Browner.

## 2016-08-04 DIAGNOSIS — Z01810 Encounter for preprocedural cardiovascular examination: Secondary | ICD-10-CM | POA: Diagnosis not present

## 2016-08-04 DIAGNOSIS — Z9861 Coronary angioplasty status: Secondary | ICD-10-CM | POA: Diagnosis not present

## 2016-08-04 DIAGNOSIS — I2510 Atherosclerotic heart disease of native coronary artery without angina pectoris: Secondary | ICD-10-CM | POA: Diagnosis not present

## 2016-08-04 DIAGNOSIS — R001 Bradycardia, unspecified: Secondary | ICD-10-CM | POA: Diagnosis not present

## 2016-08-05 NOTE — Telephone Encounter (Signed)
Pt called to check on surgical clearance form. I told him it was faxed to cardiologist on 08/02/16 per Dr. Diona Browner.

## 2016-08-09 ENCOUNTER — Encounter (HOSPITAL_COMMUNITY): Payer: Self-pay

## 2016-08-09 NOTE — Patient Instructions (Signed)
EIN RIJO  08/09/2016   Your procedure is scheduled on: 08/17/2016    Report to [facility] Main  Entrance .  Follow signs to Short Stay on first floor at  0515 AM  Call this number if you have problems the morning of surgery  [phone]   Remember: ONLY 1 PERSON MAY GO WITH YOU TO SHORT STAY TO GET  READY MORNING OF YOUR SURGERY.  Do not eat food or drink liquids :After Midnight.     Take these medicines the morning of surgery with A SIP OF WATER: Amlodipine ( NOrvasc), Depakote, Inspra                                 You may not have any metal on your body including hair pins and              piercings  Do not wear jewelry, , lotions, powders or perfumes, deodorant                           Men may shave face and neck.   Do not bring valuables to the hospital. [place].  Contacts, dentures or bridgework may not be worn into surgery.  Leave suitcase in the car. After surgery it may be brought to your room.                       Please read over the following fact sheets you were given: _____________________________________________________________________             [facility] - Preparing for Surgery Before surgery, you can play an important role.  Because skin is not sterile, your skin needs to be as free of germs as possible.  You can reduce the number of germs on your skin by washing with CHG (chlorahexidine gluconate) soap before surgery.  CHG is an antiseptic cleaner which kills germs and bonds with the skin to continue killing germs even after washing. Please DO NOT use if you have an allergy to CHG or antibacterial soaps.  If your skin becomes reddened/irritated stop using the CHG and inform your nurse when you arrive at Short Stay. Do not shave (including legs and underarms) for at least 48 hours prior to the first CHG shower.  You may shave your face/neck. Please follow these  instructions carefully:  1.  Shower with CHG Soap the night before surgery and the  morning of Surgery.  2.  If you choose to wash your hair, wash your hair first as usual with your  normal  shampoo.  3.  After you shampoo, rinse your hair and body thoroughly to remove the  shampoo.                           4.  Use CHG as you would any other liquid soap.  You can apply chg directly  to the skin and wash                       Gently with a scrungie or clean washcloth.  5.  Apply the CHG Soap to  your body ONLY FROM THE NECK DOWN.   Do not use on face/ open                           Wound or open sores. Avoid contact with eyes, ears mouth and genitals (private parts).                       Wash face,  Genitals (private parts) with your normal soap.             6.  Wash thoroughly, paying special attention to the area where your surgery  will be performed.  7.  Thoroughly rinse your body with warm water from the neck down.  8.  DO NOT shower/wash with your normal soap after using and rinsing off  the CHG Soap.                9.  Pat yourself dry with a clean towel.            10.  Wear clean pajamas.            11.  Place clean sheets on your bed the night of your first shower and do not  sleep with pets. Day of Surgery : Do not apply any lotions/deodorants the morning of surgery.  Please wear clean clothes to the hospital/surgery center.  FAILURE TO FOLLOW THESE INSTRUCTIONS MAY RESULT IN THE CANCELLATION OF YOUR SURGERY PATIENT SIGNATURE_________________________________  NURSE SIGNATURE__________________________________  ________________________________________________________________________  WHAT IS A BLOOD TRANSFUSION? Blood Transfusion Information  A transfusion is the replacement of blood or some of its parts. Blood is made up of multiple cells which provide different functions.  Red blood cells carry oxygen and are used for blood loss replacement.  White blood cells fight against  infection.  Platelets control bleeding.  Plasma helps clot blood.  Other blood products are available for specialized needs, such as hemophilia or other clotting disorders. BEFORE THE TRANSFUSION  Who gives blood for transfusions?   Healthy volunteers who are fully evaluated to make sure their blood is safe. This is blood bank blood. Transfusion therapy is the safest it has ever been in the practice of medicine. Before blood is taken from a donor, a complete history is taken to make sure that person has no history of diseases nor engages in risky social behavior (examples are intravenous drug use or sexual activity with multiple partners). The donor's travel history is screened to minimize risk of transmitting infections, such as malaria. The donated blood is tested for signs of infectious diseases, such as HIV and hepatitis. The blood is then tested to be sure it is compatible with you in order to minimize the chance of a transfusion reaction. If you or a relative donates blood, this is often done in anticipation of surgery and is not appropriate for emergency situations. It takes many days to process the donated blood. RISKS AND COMPLICATIONS Although transfusion therapy is very safe and saves many lives, the main dangers of transfusion include:   Getting an infectious disease.  Developing a transfusion reaction. This is an allergic reaction to something in the blood you were given. Every precaution is taken to prevent this. The decision to have a blood transfusion has been considered carefully by your caregiver before blood is given. Blood is not given unless the benefits outweigh the risks. AFTER THE TRANSFUSION  Right after receiving a blood transfusion, you will usually  feel much better and more energetic. This is especially true if your red blood cells have gotten low (anemic). The transfusion raises the level of the red blood cells which carry oxygen, and this usually causes an energy  increase.  The nurse administering the transfusion will monitor you carefully for complications. HOME CARE INSTRUCTIONS  No special instructions are needed after a transfusion. You may find your energy is better. Speak with your caregiver about any limitations on activity for underlying diseases you may have. SEEK MEDICAL CARE IF:   Your condition is not improving after your transfusion.  You develop redness or irritation at the intravenous (IV) site. SEEK IMMEDIATE MEDICAL CARE IF:  Any of the following symptoms occur over the next 12 hours:  Shaking chills.  You have a temperature by mouth above 102 F (38.9 C), not controlled by medicine.  Chest, back, or muscle pain.  People around you feel you are not acting correctly or are confused.  Shortness of breath or difficulty breathing.  Dizziness and fainting.  You get a rash or develop hives.  You have a decrease in urine output.  Your urine turns a dark color or changes to pink, red, or brown. Any of the following symptoms occur over the next 10 days:  You have a temperature by mouth above 102 F (38.9 C), not controlled by medicine.  Shortness of breath.  Weakness after normal activity.  The white part of the eye turns yellow (jaundice).  You have a decrease in the amount of urine or are urinating less often.  Your urine turns a dark color or changes to pink, red, or brown. Document Released: 01/09/2000 Document Revised: 04/05/2011 Document Reviewed: 08/28/2007 ExitCare Patient Information 2014 [place].  _______________________________________________________________________  Incentive Spirometer  An incentive spirometer is a tool that can help keep your lungs clear and active. This tool measures how well you are filling your lungs with each breath. Taking long deep breaths may help reverse or decrease the chance of developing breathing (pulmonary) problems (especially infection) following:  A long  period of time when you are unable to move or be active. BEFORE THE PROCEDURE   If the spirometer includes an indicator to show your best effort, your nurse or respiratory therapist will set it to a desired goal.  If possible, sit up straight or lean slightly forward. Try not to slouch.  Hold the incentive spirometer in an upright position. INSTRUCTIONS FOR USE  1. Sit on the edge of your bed if possible, or sit up as far as you can in bed or on a chair. 2. Hold the incentive spirometer in an upright position. 3. Breathe out normally. 4. Place the mouthpiece in your mouth and seal your lips tightly around it. 5. Breathe in slowly and as deeply as possible, raising the piston or the ball toward the top of the column. 6. Hold your breath for 3-5 seconds or for as long as possible. Allow the piston or ball to fall to the bottom of the column. 7. Remove the mouthpiece from your mouth and breathe out normally. 8. Rest for a few seconds and repeat Steps 1 through 7 at least 10 times every 1-2 hours when you are awake. Take your time and take a few normal breaths between deep breaths. 9. The spirometer may include an indicator to show your best effort. Use the indicator as a goal to work toward during each repetition. 10. After each set of 10 deep breaths, practice coughing to  be sure your lungs are clear. If you have an incision (the cut made at the time of surgery), support your incision when coughing by placing a pillow or rolled up towels firmly against it. Once you are able to get out of bed, walk around indoors and cough well. You may stop using the incentive spirometer when instructed by your caregiver.  RISKS AND COMPLICATIONS  Take your time so you do not get dizzy or light-headed.  If you are in pain, you may need to take or ask for pain medication before doing incentive spirometry. It is harder to take a deep breath if you are having pain. AFTER USE  Rest and breathe slowly and  easily.  It can be helpful to keep track of a log of your progress. Your caregiver can provide you with a simple table to help with this. If you are using the spirometer at home, follow these instructions: [place] IF:   You are having difficultly using the spirometer.  You have trouble using the spirometer as often as instructed.  Your pain medication is not giving enough relief while using the spirometer.  You develop fever of 100.5 F (38.1 C) or higher. SEEK IMMEDIATE MEDICAL CARE IF:   You cough up bloody sputum that had not been present before.  You develop fever of 102 F (38.9 C) or greater.  You develop worsening pain at or near the incision site. MAKE SURE YOU:   Understand these instructions.  Will watch your condition.  Will get help right away if you are not doing well or get worse. Document Released: 05/24/2006 Document Revised: 04/05/2011 Document Reviewed: 07/25/2006 [facility] Patient Information 2014 [place], [place].   ________________________________________________________________________

## 2016-08-11 ENCOUNTER — Encounter (HOSPITAL_COMMUNITY)
Admission: RE | Admit: 2016-08-11 | Discharge: 2016-08-11 | Disposition: A | Discharge status: Home / Self Care | Payer: 59 | Source: Ambulatory Visit | Attending: Orthopedic Surgery | Admitting: Orthopedic Surgery

## 2016-08-11 ENCOUNTER — Encounter (HOSPITAL_COMMUNITY): Payer: Self-pay

## 2016-08-11 DIAGNOSIS — Z0183 Encounter for blood typing: Secondary | ICD-10-CM | POA: Diagnosis not present

## 2016-08-11 DIAGNOSIS — Z01812 Encounter for preprocedural laboratory examination: Secondary | ICD-10-CM | POA: Diagnosis present

## 2016-08-11 DIAGNOSIS — M1712 Unilateral primary osteoarthritis, left knee: Secondary | ICD-10-CM | POA: Insufficient documentation

## 2016-08-11 HISTORY — DX: Unspecified osteoarthritis, unspecified site: M19.90

## 2016-08-11 LAB — BASIC METABOLIC PANEL
ANION GAP: 8 (ref 5–15)
BUN: 28 mg/dL — ABNORMAL HIGH (ref 6–20)
CHLORIDE: 109 mmol/L (ref 101–111)
CO2: 25 mmol/L (ref 22–32)
CREATININE: 1.18 mg/dL (ref 0.61–1.24)
Calcium: 9.5 mg/dL (ref 8.9–10.3)
GFR calc non Af Amer: >60 mL/min (ref >60)
Glucose, Bld: 99 mg/dL (ref 65–99)
POTASSIUM: 5.1 mmol/L (ref 3.5–5.1)
Sodium: 142 mmol/L (ref 135–145)

## 2016-08-11 LAB — CBC
HEMATOCRIT: 33.4 % — AB (ref 39.0–52.0)
HEMOGLOBIN: 11.1 g/dL — AB (ref 13.0–17.0)
MCH: 30.7 pg (ref 26.0–34.0)
MCHC: 33.2 g/dL (ref 30.0–36.0)
MCV: 92.3 fL (ref 78.0–100.0)
Platelets: 173 10*3/uL (ref 150–400)
RBC: 3.62 MIL/uL — AB (ref 4.22–5.81)
RDW: 13.6 % (ref 11.5–15.5)
WBC: 4.3 10*3/uL (ref 4.0–10.5)

## 2016-08-11 LAB — SURGICAL PCR SCREEN
MRSA, PCR: NEGATIVE
Staphylococcus aureus: NEGATIVE

## 2016-08-11 LAB — ABO/RH: ABO/RH(D): A POS

## 2016-08-11 NOTE — Progress Notes (Addendum)
03/15/16-LOV-cardiology-epic  EKG-03/15/16-epic  Stress Test - 08/04/16- Care Everywhere- no ischemia

## 2016-08-11 NOTE — Progress Notes (Signed)
.......  1945-07-12  patient's date of birth is  Your patient has screened at an elevated risk for obstructive sleep apnea using the STOP-Bang tool during a presurgical visit. A score of 5 or greater is an elevated risk.

## 2016-08-11 NOTE — Progress Notes (Signed)
BMP done 08/11/16 faxed via epic to dr Alvan Dame.

## 2016-08-12 DIAGNOSIS — M1712 Unilateral primary osteoarthritis, left knee: Secondary | ICD-10-CM | POA: Diagnosis not present

## 2016-08-12 NOTE — H&P (Addendum)
TOTAL KNEE ADMISSION H&P  Patient is being admitted for left total knee arthroplasty.  Subjective:  Chief Complaint:   Left knee primary OA / pain  HPI: Joshua Stark, 71 y.o. male, has a history of pain and functional disability in the left knee due to arthritis and has failed non-surgical conservative treatments for greater than 12 weeks to include NSAID's and/or analgesics, corticosteriod injections, viscosupplementation injections, use of assistive devices and activity modification.  Onset of symptoms was gradual, starting 5+ years ago with gradually worsening course since that time. The patient noted no past surgery on the left knee(s).  Patient currently rates pain in the left knee(s) at 8 out of 10 with activity. Patient has night pain, worsening of pain with activity and weight bearing, pain that interferes with activities of daily living, pain with passive range of motion, crepitus and joint swelling.  Patient has evidence of periarticular osteophytes and joint space narrowing by imaging studies.  There is no active infection.  Risks, benefits and expectations were discussed with the patient.  Risks including but not limited to the risk of anesthesia, blood clots, nerve damage, blood vessel damage, failure of the prosthesis, infection and up to and including death.  Patient understand the risks, benefits and expectations and wishes to proceed with surgery.   PCP: Jinny Sanders, MD  D/C Plans:       Home   Post-op Meds:       No Rx given  Tranexamic Acid:      To be given - IV   Decadron:      Is to be given  FYI:     ASA  Norco  DME:  Rx given for - RW and 3-n-1  PT:   OPPT - Rx given   Patient Active Problem List   Diagnosis Date Noted  . Bradycardia 03/19/2014  . Aortic ejection murmur 09/18/2013  . CKD (chronic kidney disease) stage 3, GFR 30-59 ml/min 05/01/2013  . CAD S/P percutaneous coronary angioplasty   . Essential hypertension   . Hyperlipidemia with target LDL  less than 70   . Obesity (BMI 30-39.9)   . Hyperkalemia 07/27/2011  . TESTICULAR HYPOFUNCTION 07/30/2009  . Anemia of chronic disease 07/29/2009  . CONSTIPATION 07/07/2009  . ABDOMINAL PAIN, LEFT LOWER QUADRANT 07/07/2009  . SEIZURE DISORDER 10/13/2006   Past Medical History:  Diagnosis Date  . Arthritis   . CAD S/P percutaneous coronary angioplasty 1999   PCI- prox LAD @ D1, SP1 trifurcation.  3.0 mm x 16 mm AVE-GFX BMS  . History of nuclear stress test August 2012   Treadmill Myoview: 9 minutes, 10 METS --> hypertensive response.  No evidence of ischemia or infarction.  EF 64%  . HLD (hyperlipidemia)   . Hypertension   . Obesity (BMI 30-39.9)   . Seizure disorder ([place])   . Seizures ([place])    hx of years ago     Past Surgical History:  Procedure Laterality Date  . CARDIAC CATHETERIZATION  01/20/1998  . CORONARY ANGIOPLASTY  01/20/1998   PCI to LAD ,prox LAD at trifurcation for D1and SP1.3.0-x16-mm AVE-GFX bare-metal stent  . NM MYOCAR PERF WALL MOTION  Aug 2 ,2012   treadmill myoview exercised 9 minutes, reacing 10 metaboblic equivlents,had ahypertensive reponse .EF64%  . TRANSTHORACIC ECHOCARDIOGRAM  09/20/2013   normal LV size and function. EF 60-65%. Normal diastolic function. Mild LA dilation and mild AI.  No evidence of aortic stenosis. No significant MR.    No  prescriptions prior to admission.   Allergies  Allergen Reactions  . Fenofibrate Other (See Comments)    Arthritis, cramping   . Rosuvastatin Other (See Comments)    REACTION: cramps   . Pravastatin Sodium Other (See Comments)    REACTION: cramps  . Statins Other (See Comments)    Cramping     Social History  Substance Use Topics  . Smoking status: Never Smoker  . Smokeless tobacco: Never Used  . Alcohol use No    Family History  Problem Relation Age of Onset  . Hypertension Mother   . Cancer Father        lung and prostate     Review of Systems  Constitutional: Negative.   HENT: Negative.    Eyes: Negative.   Respiratory: Negative.   Cardiovascular: Negative.   Gastrointestinal: Negative.   Genitourinary: Negative.   Musculoskeletal: Positive for joint pain.  Skin: Negative.   Neurological: Negative.   Endo/Heme/Allergies: Negative.   Psychiatric/Behavioral: Negative.     Objective:  Physical Exam  Constitutional: He is oriented to person, place, and time. He appears well-developed.  HENT:  Head: Normocephalic.  Eyes: Pupils are equal, round, and reactive to light.  Neck: Neck supple. No JVD present. No tracheal deviation present. No thyromegaly present.  Cardiovascular: Normal rate, regular rhythm and intact distal pulses.   Murmur heard. Respiratory: Effort normal and breath sounds normal. No respiratory distress. He has no wheezes.  GI: Soft. There is no tenderness. There is no guarding.  Musculoskeletal:       Left knee: He exhibits decreased range of motion, swelling and bony tenderness. He exhibits no ecchymosis, no deformity, no laceration and no erythema. Tenderness found.  Lymphadenopathy:    He has no cervical adenopathy.  Neurological: He is alert and oriented to person, place, and time.  Skin: Skin is warm and dry.  Psychiatric: He has a normal mood and affect.      Labs:  Estimated body mass index is 36.4 kg/m as calculated from the following:   Height as of 08/11/16: 5\' 11"  (1.803 m).   Weight as of 08/11/16: 118.4 kg (261 lb).   Imaging Review Plain radiographs demonstrate severe degenerative joint disease of the left knee(s).  The bone quality appears to be good for age and reported activity level.  Assessment/Plan:  End stage arthritis, left knee   The patient history, physical examination, clinical judgment of the provider and imaging studies are consistent with end stage degenerative joint disease of the left knee(s) and total knee arthroplasty is deemed medically necessary. The treatment options including medical management, injection  therapy arthroscopy and arthroplasty were discussed at length. The risks and benefits of total knee arthroplasty were presented and reviewed. The risks due to aseptic loosening, infection, stiffness, patella tracking problems, thromboembolic complications and other imponderables were discussed. The patient acknowledged the explanation, agreed to proceed with the plan and consent was signed. Patient is being admitted for inpatient treatment for surgery, pain control, PT, OT, prophylactic antibiotics, VTE prophylaxis, progressive ambulation and ADL's and discharge planning. The patient is planning to be discharged home.    [place] Pugh Babish   PA-C  08/12/2016, 1:47 PM

## 2016-08-16 NOTE — Anesthesia Preprocedure Evaluation (Addendum)
Anesthesia Evaluation  Patient identified by MRN, date of birth, ID band Patient awake    Reviewed: Allergy & Precautions, NPO status , Patient's Chart, lab work & pertinent test results  Airway Mallampati: III  TM Distance: >3 FB Neck ROM: Full    Dental no notable dental hx.    Pulmonary neg pulmonary ROS,    Pulmonary exam normal breath sounds clear to auscultation       Cardiovascular hypertension, Pt. on medications + CAD and + Cardiac Stents  Normal cardiovascular exam Rhythm:Regular Rate:Normal  ECG: SB, rate 50  Stress Test - 08/04/16- Care Everywhere- no ischemia    Neuro/Psych Seizures -, Well Controlled,  negative psych ROS   GI/Hepatic negative GI ROS, Neg liver ROS,   Endo/Other  negative endocrine ROS  Renal/GU negative Renal ROS  negative genitourinary   Musculoskeletal negative musculoskeletal ROS (+)   Abdominal (+) + obese,   Peds negative pediatric ROS (+)  Hematology  (+) anemia ,   Anesthesia Other Findings Hyperlipidemia   Reproductive/Obstetrics negative OB ROS                            Anesthesia Physical Anesthesia Plan  ASA: II  Anesthesia Plan: Regional and Spinal   Post-op Pain Management:  Regional for Post-op pain   Induction: Intravenous  PONV Risk Score and Plan: 1 and Ondansetron, Dexamethasone, Propofol and Midazolam  Airway Management Planned:   Additional Equipment:   Intra-op Plan:   Post-operative Plan:   Informed Consent: I have reviewed the patients History and Physical, chart, labs and discussed the procedure including the risks, benefits and alternatives for the proposed anesthesia with the patient or authorized representative who has indicated his/her understanding and acceptance.   Dental advisory given  Plan Discussed with: CRNA  Anesthesia Plan Comments:         Anesthesia Quick Evaluation

## 2016-08-17 ENCOUNTER — Inpatient Hospital Stay (HOSPITAL_COMMUNITY): Payer: 59 | Admitting: Anesthesiology

## 2016-08-17 ENCOUNTER — Observation Stay (HOSPITAL_COMMUNITY)
Admission: RE | Admit: 2016-08-17 | Discharge: 2016-08-18 | Disposition: A | Discharge status: Home / Self Care | Payer: 59 | Source: Ambulatory Visit | Attending: Orthopedic Surgery | Admitting: Orthopedic Surgery

## 2016-08-17 ENCOUNTER — Encounter (HOSPITAL_COMMUNITY)
Admission: RE | Disposition: A | Discharge status: Home / Self Care | Payer: Self-pay | Source: Ambulatory Visit | Attending: Orthopedic Surgery

## 2016-08-17 ENCOUNTER — Encounter (HOSPITAL_COMMUNITY): Payer: Self-pay

## 2016-08-17 DIAGNOSIS — Z955 Presence of coronary angioplasty implant and graft: Secondary | ICD-10-CM | POA: Insufficient documentation

## 2016-08-17 DIAGNOSIS — I129 Hypertensive chronic kidney disease with stage 1 through stage 4 chronic kidney disease, or unspecified chronic kidney disease: Secondary | ICD-10-CM | POA: Insufficient documentation

## 2016-08-17 DIAGNOSIS — Z96652 Presence of left artificial knee joint: Secondary | ICD-10-CM

## 2016-08-17 DIAGNOSIS — M1712 Unilateral primary osteoarthritis, left knee: Secondary | ICD-10-CM | POA: Diagnosis not present

## 2016-08-17 DIAGNOSIS — E669 Obesity, unspecified: Secondary | ICD-10-CM | POA: Insufficient documentation

## 2016-08-17 DIAGNOSIS — I2510 Atherosclerotic heart disease of native coronary artery without angina pectoris: Secondary | ICD-10-CM | POA: Diagnosis not present

## 2016-08-17 DIAGNOSIS — Z6830 Body mass index (BMI) 30.0-30.9, adult: Secondary | ICD-10-CM

## 2016-08-17 DIAGNOSIS — Z6836 Body mass index (BMI) 36.0-36.9, adult: Secondary | ICD-10-CM | POA: Diagnosis not present

## 2016-08-17 DIAGNOSIS — G8918 Other acute postprocedural pain: Secondary | ICD-10-CM | POA: Diagnosis not present

## 2016-08-17 DIAGNOSIS — N183 Chronic kidney disease, stage 3 (moderate): Secondary | ICD-10-CM | POA: Insufficient documentation

## 2016-08-17 DIAGNOSIS — E785 Hyperlipidemia, unspecified: Secondary | ICD-10-CM | POA: Insufficient documentation

## 2016-08-17 DIAGNOSIS — G40909 Epilepsy, unspecified, not intractable, without status epilepticus: Secondary | ICD-10-CM | POA: Insufficient documentation

## 2016-08-17 DIAGNOSIS — Z96659 Presence of unspecified artificial knee joint: Secondary | ICD-10-CM | POA: Insufficient documentation

## 2016-08-17 HISTORY — PX: TOTAL KNEE ARTHROPLASTY: SHX125

## 2016-08-17 LAB — TYPE AND SCREEN
ABO/RH(D): A POS
ANTIBODY SCREEN: NEGATIVE

## 2016-08-17 SURGERY — ARTHROPLASTY, KNEE, TOTAL
Anesthesia: Regional | Site: Knee | Laterality: Left

## 2016-08-17 MED ORDER — METHOCARBAMOL 1000 MG/10ML IJ SOLN
500.0000 mg | Freq: Four times a day (QID) | INTRAVENOUS | Status: DC | PRN
  Administered 2016-08-17: 500 mg via INTRAVENOUS
  Filled 2016-08-17: qty 5

## 2016-08-17 MED ORDER — ONDANSETRON HCL 4 MG/2ML IJ SOLN
INTRAMUSCULAR | Status: DC | PRN
  Administered 2016-08-17: 4 mg via INTRAVENOUS

## 2016-08-17 MED ORDER — FENTANYL CITRATE (PF) 100 MCG/2ML IJ SOLN
INTRAMUSCULAR | Status: AC
  Filled 2016-08-17: qty 2

## 2016-08-17 MED ORDER — PROPOFOL 500 MG/50ML IV EMUL
INTRAVENOUS | Status: DC | PRN
  Administered 2016-08-17: 75 ug/kg/min via INTRAVENOUS

## 2016-08-17 MED ORDER — DEXAMETHASONE SODIUM PHOSPHATE 10 MG/ML IJ SOLN
INTRAMUSCULAR | Status: AC
  Filled 2016-08-17: qty 1

## 2016-08-17 MED ORDER — MENTHOL 3 MG MT LOZG: 1.0000 | LOZENGE | OROMUCOSAL | Status: DC | PRN

## 2016-08-17 MED ORDER — PROPOFOL 10 MG/ML IV BOLUS
INTRAVENOUS | Status: AC
  Filled 2016-08-17: qty 40

## 2016-08-17 MED ORDER — BISACODYL 10 MG RE SUPP: 10.0000 mg | Freq: Every day | RECTAL | Status: DC | PRN

## 2016-08-17 MED ORDER — DIVALPROEX SODIUM 250 MG PO DR TAB
250.0000 mg | DELAYED_RELEASE_TABLET | Freq: Two times a day (BID) | ORAL | Status: DC
  Administered 2016-08-17 – 2016-08-18 (×2): 250 mg via ORAL
  Filled 2016-08-17 (×2): qty 1

## 2016-08-17 MED ORDER — MAGNESIUM CITRATE PO SOLN: 1.0000 | Freq: Once | ORAL | Status: DC | PRN

## 2016-08-17 MED ORDER — DIPHENHYDRAMINE HCL 25 MG PO CAPS: 25.0000 mg | ORAL_CAPSULE | Freq: Four times a day (QID) | ORAL | Status: DC | PRN

## 2016-08-17 MED ORDER — ONDANSETRON HCL 4 MG/2ML IJ SOLN
4.0000 mg | Freq: Four times a day (QID) | INTRAMUSCULAR | Status: DC | PRN
  Administered 2016-08-17: 4 mg via INTRAVENOUS
  Filled 2016-08-17: qty 2

## 2016-08-17 MED ORDER — EZETIMIBE 10 MG PO TABS
10.0000 mg | ORAL_TABLET | Freq: Every day | ORAL | Status: DC
  Administered 2016-08-17 – 2016-08-18 (×2): 10 mg via ORAL
  Filled 2016-08-17 (×2): qty 1

## 2016-08-17 MED ORDER — SODIUM CHLORIDE 0.9 % IV SOLN
INTRAVENOUS | Status: DC
  Administered 2016-08-17 (×2): via INTRAVENOUS

## 2016-08-17 MED ORDER — FENTANYL CITRATE (PF) 100 MCG/2ML IJ SOLN: 25.0000 ug | INTRAMUSCULAR | Status: DC | PRN

## 2016-08-17 MED ORDER — SODIUM CHLORIDE 0.9 % IJ SOLN
INTRAMUSCULAR | Status: AC
  Filled 2016-08-17: qty 50

## 2016-08-17 MED ORDER — FERROUS SULFATE 325 (65 FE) MG PO TABS: 325.0000 mg | ORAL_TABLET | Freq: Three times a day (TID) | ORAL | Status: DC

## 2016-08-17 MED ORDER — SPIRONOLACTONE 25 MG PO TABS
25.0000 mg | ORAL_TABLET | Freq: Every day | ORAL | Status: DC
  Administered 2016-08-18: 25 mg via ORAL
  Filled 2016-08-17: qty 1

## 2016-08-17 MED ORDER — ONDANSETRON HCL 4 MG/2ML IJ SOLN
INTRAMUSCULAR | Status: AC
  Filled 2016-08-17: qty 2

## 2016-08-17 MED ORDER — DOCUSATE SODIUM 100 MG PO CAPS
100.0000 mg | ORAL_CAPSULE | Freq: Two times a day (BID) | ORAL | Status: DC
  Administered 2016-08-17 – 2016-08-18 (×2): 100 mg via ORAL
  Filled 2016-08-17 (×2): qty 1

## 2016-08-17 MED ORDER — PROPOFOL 10 MG/ML IV BOLUS
INTRAVENOUS | Status: AC
  Filled 2016-08-17: qty 20

## 2016-08-17 MED ORDER — HYDROMORPHONE HCL-NACL 0.5-0.9 MG/ML-% IV SOSY
PREFILLED_SYRINGE | INTRAVENOUS | Status: AC
  Filled 2016-08-17: qty 2

## 2016-08-17 MED ORDER — METOCLOPRAMIDE HCL 5 MG PO TABS
5.0000 mg | ORAL_TABLET | Freq: Three times a day (TID) | ORAL | Status: DC | PRN
  Administered 2016-08-17: 10 mg via ORAL
  Filled 2016-08-17: qty 2

## 2016-08-17 MED ORDER — MIDAZOLAM HCL 2 MG/2ML IJ SOLN
INTRAMUSCULAR | Status: AC
  Filled 2016-08-17: qty 2

## 2016-08-17 MED ORDER — LACTATED RINGERS IV SOLN
INTRAVENOUS | Status: DC
  Administered 2016-08-17 (×2): via INTRAVENOUS
  Administered 2016-08-17: 1000 mL via INTRAVENOUS

## 2016-08-17 MED ORDER — BUPIVACAINE-EPINEPHRINE 0.25% -1:200000 IJ SOLN
INTRAMUSCULAR | Status: AC
  Filled 2016-08-17: qty 1

## 2016-08-17 MED ORDER — SODIUM CHLORIDE 0.9 % IR SOLN
Status: DC | PRN
  Administered 2016-08-17: 1000 mL

## 2016-08-17 MED ORDER — PHENOL 1.4 % MT LIQD: 1.0000 | OROMUCOSAL | Status: DC | PRN

## 2016-08-17 MED ORDER — ONDANSETRON HCL 4 MG PO TABS
4.0000 mg | ORAL_TABLET | Freq: Four times a day (QID) | ORAL | Status: DC | PRN
  Administered 2016-08-18: 4 mg via ORAL
  Filled 2016-08-17: qty 1

## 2016-08-17 MED ORDER — BUPIVACAINE IN DEXTROSE 0.75-8.25 % IT SOLN
INTRATHECAL | Status: DC | PRN
  Administered 2016-08-17: 2 mL via INTRATHECAL

## 2016-08-17 MED ORDER — ONDANSETRON HCL 4 MG/2ML IJ SOLN
4.0000 mg | Freq: Once | INTRAMUSCULAR | Status: DC | PRN
Start: 2016-08-17 — End: 2016-08-17

## 2016-08-17 MED ORDER — EPHEDRINE SULFATE-NACL 50-0.9 MG/10ML-% IV SOSY
PREFILLED_SYRINGE | INTRAVENOUS | Status: DC | PRN
  Administered 2016-08-17: 5 mg via INTRAVENOUS
  Administered 2016-08-17 (×2): 15 mg via INTRAVENOUS
  Administered 2016-08-17: 5 mg via INTRAVENOUS

## 2016-08-17 MED ORDER — CEFAZOLIN SODIUM-DEXTROSE 2-4 GM/100ML-% IV SOLN
2.0000 g | INTRAVENOUS | Status: AC
  Administered 2016-08-17: 2 g via INTRAVENOUS

## 2016-08-17 MED ORDER — MIDAZOLAM HCL 5 MG/5ML IJ SOLN
INTRAMUSCULAR | Status: DC | PRN
  Administered 2016-08-17 (×2): 1 mg via INTRAVENOUS

## 2016-08-17 MED ORDER — ALUM & MAG HYDROXIDE-SIMETH 200-200-20 MG/5ML PO SUSP: 15.0000 mL | ORAL | Status: DC | PRN

## 2016-08-17 MED ORDER — HYDROCODONE-ACETAMINOPHEN 7.5-325 MG PO TABS
1.0000 | ORAL_TABLET | ORAL | Status: DC
  Administered 2016-08-17: 1 via ORAL
  Administered 2016-08-17: 2 via ORAL
  Administered 2016-08-17: 1 via ORAL
  Administered 2016-08-17 – 2016-08-18 (×3): 2 via ORAL
  Filled 2016-08-17 (×5): qty 2

## 2016-08-17 MED ORDER — TRANEXAMIC ACID 1000 MG/10ML IV SOLN
1000.0000 mg | INTRAVENOUS | Status: AC
  Administered 2016-08-17: 1000 mg via INTRAVENOUS
  Filled 2016-08-17: qty 10

## 2016-08-17 MED ORDER — SODIUM CHLORIDE 0.9 % IV SOLN
1000.0000 mg | Freq: Once | INTRAVENOUS | Status: AC
  Administered 2016-08-17: 1000 mg via INTRAVENOUS
  Filled 2016-08-17: qty 10

## 2016-08-17 MED ORDER — CEFAZOLIN SODIUM-DEXTROSE 2-4 GM/100ML-% IV SOLN
INTRAVENOUS | Status: AC
  Filled 2016-08-17: qty 100

## 2016-08-17 MED ORDER — SODIUM CHLORIDE 0.9 % IJ SOLN
INTRAMUSCULAR | Status: DC | PRN
  Administered 2016-08-17: 30 mL

## 2016-08-17 MED ORDER — AMLODIPINE BESYLATE 5 MG PO TABS
5.0000 mg | ORAL_TABLET | Freq: Every day | ORAL | Status: DC
  Administered 2016-08-18: 5 mg via ORAL
  Filled 2016-08-17: qty 1

## 2016-08-17 MED ORDER — KETOROLAC TROMETHAMINE 30 MG/ML IJ SOLN
INTRAMUSCULAR | Status: DC | PRN
  Administered 2016-08-17: 30 mg

## 2016-08-17 MED ORDER — DEXAMETHASONE SODIUM PHOSPHATE 10 MG/ML IJ SOLN
10.0000 mg | Freq: Once | INTRAMUSCULAR | Status: AC
  Administered 2016-08-17: 10 mg via INTRAVENOUS

## 2016-08-17 MED ORDER — BUPIVACAINE-EPINEPHRINE (PF) 0.25% -1:200000 IJ SOLN
INTRAMUSCULAR | Status: DC | PRN
  Administered 2016-08-17: 30 mL

## 2016-08-17 MED ORDER — CELECOXIB 200 MG PO CAPS
200.0000 mg | ORAL_CAPSULE | Freq: Two times a day (BID) | ORAL | Status: DC
  Administered 2016-08-17: 200 mg via ORAL
  Filled 2016-08-17: qty 1

## 2016-08-17 MED ORDER — FENTANYL CITRATE (PF) 100 MCG/2ML IJ SOLN
INTRAMUSCULAR | Status: DC | PRN
  Administered 2016-08-17 (×2): 50 ug via INTRAVENOUS

## 2016-08-17 MED ORDER — CHLORHEXIDINE GLUCONATE 4 % EX LIQD: 60.0000 mL | Freq: Once | CUTANEOUS | Status: DC

## 2016-08-17 MED ORDER — KETOROLAC TROMETHAMINE 30 MG/ML IJ SOLN
INTRAMUSCULAR | Status: AC
  Filled 2016-08-17: qty 1

## 2016-08-17 MED ORDER — CEFAZOLIN SODIUM-DEXTROSE 2-4 GM/100ML-% IV SOLN
2.0000 g | Freq: Four times a day (QID) | INTRAVENOUS | Status: AC
  Administered 2016-08-17 (×2): 2 g via INTRAVENOUS
  Filled 2016-08-17 (×2): qty 100

## 2016-08-17 MED ORDER — METOCLOPRAMIDE HCL 5 MG/ML IJ SOLN: 5.0000 mg | Freq: Three times a day (TID) | INTRAMUSCULAR | Status: DC | PRN

## 2016-08-17 MED ORDER — BUPIVACAINE-EPINEPHRINE (PF) 0.5% -1:200000 IJ SOLN
INTRAMUSCULAR | Status: DC | PRN
  Administered 2016-08-17: 30 mL via PERINEURAL

## 2016-08-17 MED ORDER — METHOCARBAMOL 500 MG PO TABS
500.0000 mg | ORAL_TABLET | Freq: Four times a day (QID) | ORAL | Status: DC | PRN
  Administered 2016-08-17 – 2016-08-18 (×2): 500 mg via ORAL
  Filled 2016-08-17 (×2): qty 1

## 2016-08-17 MED ORDER — FENTANYL CITRATE (PF) 100 MCG/2ML IJ SOLN
25.0000 ug | INTRAMUSCULAR | Status: DC | PRN
  Administered 2016-08-17 (×2): 25 ug via INTRAVENOUS
  Administered 2016-08-17: 50 ug via INTRAVENOUS

## 2016-08-17 MED ORDER — DEXAMETHASONE SODIUM PHOSPHATE 10 MG/ML IJ SOLN
10.0000 mg | Freq: Once | INTRAMUSCULAR | Status: AC
  Administered 2016-08-18: 10 mg via INTRAVENOUS
  Filled 2016-08-17: qty 1

## 2016-08-17 MED ORDER — POLYETHYLENE GLYCOL 3350 17 G PO PACK
17.0000 g | PACK | Freq: Two times a day (BID) | ORAL | Status: DC
  Administered 2016-08-17: 17 g via ORAL
  Filled 2016-08-17: qty 1

## 2016-08-17 MED ORDER — ASPIRIN 81 MG PO CHEW
81.0000 mg | CHEWABLE_TABLET | Freq: Two times a day (BID) | ORAL | Status: DC
  Administered 2016-08-17 – 2016-08-18 (×2): 81 mg via ORAL
  Filled 2016-08-17 (×2): qty 1

## 2016-08-17 SURGICAL SUPPLY — 48 items
ADH SKN CLS APL DERMABOND .7 (GAUZE/BANDAGES/DRESSINGS) ×1
BAG DECANTER FOR FLEXI CONT (MISCELLANEOUS) IMPLANT
BAG SPEC THK2 15X12 ZIP CLS (MISCELLANEOUS)
BAG ZIPLOCK 12X15 (MISCELLANEOUS) IMPLANT
BANDAGE ACE 6X5 VEL STRL LF (GAUZE/BANDAGES/DRESSINGS) ×2 IMPLANT
BLADE SAW SGTL 11.0X1.19X90.0M (BLADE) IMPLANT
BLADE SAW SGTL 13.0X1.19X90.0M (BLADE) ×2 IMPLANT
BOWL SMART MIX CTS (DISPOSABLE) ×2 IMPLANT
CAPT KNEE TOTAL 3 ATTUNE ×1 IMPLANT
CEMENT HV SMART SET (Cement) ×2 IMPLANT
COVER SURGICAL LIGHT HANDLE (MISCELLANEOUS) ×2 IMPLANT
CUFF TOURN SGL QUICK 34 (TOURNIQUET CUFF) ×2
CUFF TRNQT CYL 34X4X40X1 (TOURNIQUET CUFF) ×1 IMPLANT
DECANTER SPIKE VIAL GLASS SM (MISCELLANEOUS) ×2 IMPLANT
DERMABOND ADVANCED (GAUZE/BANDAGES/DRESSINGS) ×1
DERMABOND ADVANCED .7 DNX12 (GAUZE/BANDAGES/DRESSINGS) ×1 IMPLANT
DRAPE U-SHAPE 47X51 STRL (DRAPES) ×2 IMPLANT
DRESSING AQUACEL AG SP 3.5X10 (GAUZE/BANDAGES/DRESSINGS) ×1 IMPLANT
DRSG AQUACEL AG ADV 3.5X10 (GAUZE/BANDAGES/DRESSINGS) ×1 IMPLANT
DRSG AQUACEL AG SP 3.5X10 (GAUZE/BANDAGES/DRESSINGS) ×2
DURAPREP 26ML APPLICATOR (WOUND CARE) ×4 IMPLANT
ELECT REM PT RETURN 15FT ADLT (MISCELLANEOUS) ×2 IMPLANT
GLOVE BIOGEL M 7.0 STRL (GLOVE) IMPLANT
GLOVE BIOGEL PI IND STRL 7.5 (GLOVE) ×1 IMPLANT
GLOVE BIOGEL PI IND STRL 8.5 (GLOVE) ×1 IMPLANT
GLOVE BIOGEL PI INDICATOR 7.5 (GLOVE) ×5
GLOVE BIOGEL PI INDICATOR 8.5 (GLOVE) ×1
GLOVE ECLIPSE 8.0 STRL XLNG CF (GLOVE) ×3 IMPLANT
GLOVE ORTHO TXT STRL SZ7.5 (GLOVE) ×3 IMPLANT
GOWN STRL REUS W/TWL LRG LVL3 (GOWN DISPOSABLE) ×3 IMPLANT
GOWN STRL REUS W/TWL XL LVL3 (GOWN DISPOSABLE) ×3 IMPLANT
HANDPIECE INTERPULSE COAX TIP (DISPOSABLE) ×2
MANIFOLD NEPTUNE II (INSTRUMENTS) ×2 IMPLANT
PACK TOTAL KNEE CUSTOM (KITS) ×2 IMPLANT
POSITIONER SURGICAL ARM (MISCELLANEOUS) ×2 IMPLANT
SET HNDPC FAN SPRY TIP SCT (DISPOSABLE) ×1 IMPLANT
SET PAD KNEE POSITIONER (MISCELLANEOUS) ×2 IMPLANT
SUT MNCRL AB 4-0 PS2 18 (SUTURE) ×2 IMPLANT
SUT STRATAFIX 0 PDS 27 VIOLET (SUTURE) ×2
SUT VIC AB 1 CT1 36 (SUTURE) ×3 IMPLANT
SUT VIC AB 2-0 CT1 27 (SUTURE) ×6
SUT VIC AB 2-0 CT1 TAPERPNT 27 (SUTURE) ×3 IMPLANT
SUTURE STRATFX 0 PDS 27 VIOLET (SUTURE) ×1 IMPLANT
SYR 50ML LL SCALE MARK (SYRINGE) ×2 IMPLANT
TRAY FOLEY W/METER SILVER 16FR (SET/KITS/TRAYS/PACK) ×2 IMPLANT
WATER STERILE IRR 1500ML POUR (IV SOLUTION) ×2 IMPLANT
WRAP KNEE MAXI GEL POST OP (GAUZE/BANDAGES/DRESSINGS) ×2 IMPLANT
YANKAUER SUCT BULB TIP 10FT TU (MISCELLANEOUS) ×2 IMPLANT

## 2016-08-17 NOTE — Op Note (Signed)
NAME:  Joshua Stark                      MEDICAL RECORD NO.:  [id number]                             FACILITY:  [facility]      PHYSICIAN:  Pietro Cassis. Alvan Dame, M.D.  DATE OF BIRTH:  23-Oct-1945      DATE OF PROCEDURE:  08/17/2016                                     OPERATIVE REPORT         PREOPERATIVE DIAGNOSIS:  Left knee osteoarthritis.      POSTOPERATIVE DIAGNOSIS:  Left knee osteoarthritis.      FINDINGS:  The patient was noted to have complete loss of cartilage and   bone-on-bone arthritis with associated osteophytes in the medial and patellofemoral compartments of   the knee with a significant synovitis and associated effusion.      PROCEDURE:  Left total knee replacement.      COMPONENTS USED:  DePuy Attune rotating platform posterior stabilized knee   system, a size 7 femur, 7 tibia, size 5 PS AOX insert, and 41 anatomic patellar   button.      SURGEON:  Pietro Cassis. Alvan Dame, M.D.      ASSISTANT:  Danae Orleans, PA-C.      ANESTHESIA:  Regional and Spinal.      SPECIMENS:  None.      COMPLICATION:  None.      DRAINS:  None.  EBL: <150cc      TOURNIQUET TIME:   Total Tourniquet Time Documented: Thigh (Left) - 42 minutes Total: Thigh (Left) - 42 minutes  .      The patient was stable to the recovery room.      INDICATION FOR PROCEDURE:  Joshua Stark is a 71 y.o. male patient of   mine.  The patient had been seen, evaluated, and treated conservatively in the   office with medication, activity modification, and injections.  The patient had   radiographic changes of bone-on-bone arthritis with endplate sclerosis and osteophytes noted.      The patient failed conservative measures including medication, injections, and activity modification, and at this point was ready for more definitive measures.   Based on the radiographic changes and failed conservative measures, the patient   decided to proceed with total knee replacement.  Risks of infection,   DVT, component  failure, need for revision surgery, postop course, and   expectations were all   discussed and reviewed.  Consent was obtained for benefit of pain   relief.      PROCEDURE IN DETAIL:  The patient was brought to the operative theater.   Once adequate anesthesia, preoperative antibiotics, 2 gm of Ancef, 1 gm of Tranexamic Acid, and 10 mg of Decadron administered, the patient was positioned supine with the left thigh tourniquet placed.  The  left lower extremity was prepped and draped in sterile fashion.  A time-   out was performed identifying the patient, planned procedure, and   extremity.      The left lower extremity was placed in the [facility] leg holder.  The leg was   exsanguinated, tourniquet elevated to 250 mmHg.  A midline incision was  made followed by median parapatellar arthrotomy.  Following initial   exposure, attention was first directed to the patella.  Precut   measurement was noted to be 26 mm.  I resected down to 14-15 mm and used a   41 anatomic patellar button to restore patellar height as well as cover the cut   surface.      The lug holes were drilled and a metal shim was placed to protect the   patella from retractors and saw blades.      At this point, attention was now directed to the femur.  The femoral   canal was opened with a drill, irrigated to try to prevent fat emboli.  An   intramedullary rod was passed at 5 degrees valgus, 11 mm of bone was   resected off the distal femur due to pre-operative flexion contracture.  Following this resection, the tibia was   subluxated anteriorly.  Using the extramedullary guide, 2 mm of bone was resected off   the proximal medial tibia.  We confirmed the gap would be   stable medially and laterally with a size 5 spacer block as well as confirmed   the cut was perpendicular in the coronal plane, checking with an alignment rod.      Once this was done, I sized the femur to be a size 7 in the anterior-   posterior dimension,  chose a standard component based on medial and   lateral dimension.  The size 7 rotation block was then pinned in   position anterior referenced using the C-clamp to set rotation.  The   anterior, posterior, and  chamfer cuts were made without difficulty nor   notching making certain that I was along the anterior cortex to help   with flexion gap stability.      The final box cut was made off the lateral aspect of distal femur.      At this point, the tibia was sized to be a size 7, the size 7 tray was   then pinned in position through the medial third of the tubercle,   drilled, and keel punched.  Trial reduction was now carried with a 7 femur,  7 tibia, a size 5 PS insert, and the 41 anatomic patella botton.  The knee was brought to   extension, full extension with good flexion stability with the patella   tracking through the trochlea without application of pressure.  Given   all these findings the femoral lug holes were drilled and then the trial components removed.  Final components were   opened and cement was mixed.  The knee was irrigated with normal saline   solution and pulse lavage.  The synovial lining was   then injected with 30 cc of 0.25% Marcaine with epinephrine and 1 cc of Toradol plus 30 cc of NS for a   total of 61 cc.      The knee was irrigated.  Final implants were then cemented onto clean and   dried cut surfaces of bone with the knee brought to extension with a size 5 PS trial insert.      Once the cement had fully cured, the excess cement was removed   throughout the knee.  I confirmed I was satisfied with the range of   motion and stability, and the final size 5 PS AOX insert was chosen.  It was   placed into the knee.      The tourniquet had  been let down at 42 minutes.  No significant   hemostasis required.  The   extensor mechanism was then reapproximated using #1 Vicryl and #0 V-lock sutures with the knee   in flexion.  The   remaining wound was closed  with 2-0 Vicryl and running 4-0 Monocryl.   The knee was cleaned, dried, dressed sterilely using Dermabond and   Aquacel dressing.  The patient was then   brought to recovery room in stable condition, tolerating the procedure   well.   Please note that Physician Assistant, Danae Orleans, PA-C, was present for the entirety of the case, and was utilized for pre-operative positioning, peri-operative retractor management, general facilitation of the procedure.  He was also utilized for primary wound closure at the end of the case.              Pietro Cassis Alvan Dame, M.D.    08/17/2016 8:58 AM

## 2016-08-17 NOTE — Evaluation (Signed)
Physical Therapy Evaluation Patient Details Name: ZYMIR NAPOLI MRN: [id number] DOB: 04-04-1945 Today's Date: 08/17/2016   History of Present Illness  LTKA  Clinical Impression  The patient ambulated x 180'. Plans Dc tomorrow. Pt admitted with above diagnosis. Pt currently with functional limitations due to the deficits listed below (see PT Problem List). Pt will benefit from skilled PT to increase their independence and safety with mobility to allow discharge to the venue listed below.       Follow Up Recommendations Outpatient PT;DC plan and follow up therapy as arranged by surgeon    Equipment Recommendations  None recommended by PT    Recommendations for Other Services       Precautions / Restrictions Precautions Precautions: Fall;Knee      Mobility  Bed Mobility Overal bed mobility: Needs Assistance Bed Mobility: Supine to Sit     Supine to sit: Min guard     General bed mobility comments: self assists left leg  Transfers Overall transfer level: Needs assistance Equipment used: Rolling walker (2 wheeled) Transfers: Sit to/from Stand Sit to Stand: Min assist         General transfer comment: cues for technique  Ambulation/Gait Ambulation/Gait assistance: Min assist Ambulation Distance (Feet): 180 Feet Assistive device: Rolling walker (2 wheeled) Gait Pattern/deviations: Step-to pattern;Step-through pattern;Antalgic     General Gait Details: gues for sequence  Stairs            Wheelchair Mobility    Modified Rankin (Stroke Patients Only)       Balance                                             Pertinent Vitals/Pain Pain Assessment: 0-10 Pain Score: 3  Pain Location: right knee Pain Descriptors / Indicators: Discomfort;Sore Pain Intervention(s): Premedicated before session;Repositioned;Ice applied    Home Living Family/patient expects to be discharged to:: Private residence Living Arrangements:  Spouse/significant other Available Help at Discharge: Family Type of Home: House Home Access: Stairs to enter Entrance Stairs-Rails: Psychiatric nurse of Steps: 3 Home Layout: Two level;Able to live on main level with bedroom/bathroom Home Equipment: Gilford Rile - 2 wheels;Cane - single point      Prior Function Level of Independence: Independent               Hand Dominance        Extremity/Trunk Assessment   Upper Extremity Assessment Upper Extremity Assessment: Defer to OT evaluation    Lower Extremity Assessment Lower Extremity Assessment: LLE deficits/detail LLE Deficits / Details: performs SLR, flexion10-50    Cervical / Trunk Assessment Cervical / Trunk Assessment: Normal  Communication   Communication: No difficulties  Cognition Arousal/Alertness: Awake/alert Behavior During Therapy: WFL for tasks assessed/performed Overall Cognitive Status: Within Functional Limits for tasks assessed                                        General Comments      Exercises Total Joint Exercises Quad Sets: AROM;Both;10 reps   Assessment/Plan    PT Assessment Patient needs continued PT services  PT Problem List Decreased strength;Decreased range of motion;Decreased activity tolerance;Decreased mobility;Decreased knowledge of precautions;Decreased safety awareness;Decreased knowledge of use of DME;Pain       PT Treatment Interventions DME instruction;Gait training;Stair training;Functional  mobility training;Patient/family education;Therapeutic activities    PT Goals (Current goals can be found in the Care Plan section)  Acute Rehab PT Goals Patient Stated Goal: to get the other knee done PT Goal Formulation: With patient/family Time For Goal Achievement: 08/20/16 Potential to Achieve Goals: Good    Frequency 7X/week   Barriers to discharge        Co-evaluation               AM-PAC PT "6 Clicks" Daily Activity  Outcome  Measure Difficulty turning over in bed (including adjusting bedclothes, sheets and blankets)?: A Little Difficulty moving from lying on back to sitting on the side of the bed? : A Little Difficulty sitting down on and standing up from a chair with arms (e.g., wheelchair, bedside commode, etc,.)?: Total Help needed moving to and from a bed to chair (including a wheelchair)?: A Little Help needed walking in hospital room?: A Little Help needed climbing 3-5 steps with a railing? : Total 6 Click Score: 14    End of Session   Activity Tolerance: Patient tolerated treatment well Patient left: in chair;with call bell/phone within reach;with family/visitor present Nurse Communication: Mobility status PT Visit Diagnosis: Difficulty in walking, not elsewhere classified (R26.2);Pain Pain - Right/Left: Left Pain - part of body: Knee    Time: 4742-5956 PT Time Calculation (min) (ACUTE ONLY): 32 min   Charges:   PT Evaluation $PT Eval Low Complexity: 1 Procedure PT Treatments $Gait Training: 8-22 mins   PT G Codes:   PT G-Codes **NOT FOR INPATIENT CLASS** Functional Assessment Tool Used: Clinical judgement Functional Limitation: Mobility: Walking and moving around Mobility: Walking and Moving Around Current Status (L8756): At least 20 percent but less than 40 percent impaired, limited or restricted Mobility: Walking and Moving Around Goal Status [phone]): At least 1 percent but less than 20 percent impaired, limited or restricted    [facility] PT [phone]  Claretha Cooper 08/17/2016, 5:01 PM

## 2016-08-17 NOTE — Interval H&P Note (Signed)
History and Physical Interval Note:  08/17/2016 6:51 AM  Joshua Stark  has presented today for surgery, with the diagnosis of Left knee osteoarthritis   The various methods of treatment have been discussed with the patient and family. After consideration of risks, benefits and other options for treatment, the patient has consented to  Procedure(s): LEFT TOTAL KNEE ARTHROPLASTY (Left) as a surgical intervention .  The patient's history has been reviewed, patient examined, no change in status, stable for surgery.  I have reviewed the patient's chart and labs.  Questions were answered to the patient's satisfaction.     Mauri Pole

## 2016-08-17 NOTE — Anesthesia Procedure Notes (Signed)
Anesthesia Regional Block: Adductor canal block   Pre-Anesthetic Checklist: ,, timeout performed, Correct Patient, Correct Site, Correct Laterality, Correct Procedure,, site marked, risks and benefits discussed, Surgical consent,  Pre-op evaluation,  At surgeon's request and post-op pain management  Laterality: Left  Prep: chloraprep       Needles:  Injection technique: Single-shot  Needle Type: Echogenic Stimulator Needle     Needle Length: 9cm  Needle Gauge: 21     Additional Needles:   Procedures: ultrasound guided,,,,,,,,  Narrative:  Start time: 08/17/2016 6:50 AM End time: 08/17/2016 7:00 AM Injection made incrementally with aspirations every 5 mL.  Performed by: Personally  Anesthesiologist: Adele Barthel P  Additional Notes: Functioning IV was confirmed and monitors were applied.  A 61mm 21ga Arrow echogenic stimulator needle was used. Sterile prep,hand hygiene and sterile gloves were used.  Negative aspiration and negative test dose prior to incremental administration of local anesthetic. The patient tolerated the procedure well.

## 2016-08-17 NOTE — Anesthesia Procedure Notes (Addendum)
Spinal  Patient location during procedure: OR Start time: 08/17/2016 7:10 AM End time: 08/17/2016 7:25 AM Staffing Anesthesiologist: Adele Barthel P Resident/CRNA: KEY, KRISTOPHER Performed: anesthesiologist  Preanesthetic Checklist Completed: patient identified, site marked, surgical consent, pre-op evaluation, timeout performed, IV checked, risks and benefits discussed and monitors and equipment checked Spinal Block Patient position: sitting Prep: Betadine Patient monitoring: heart rate, cardiac monitor, continuous pulse ox and blood pressure Approach: midline Location: L3-4 Injection technique: single-shot Needle Needle type: Pencan  Needle gauge: 24 G Needle length: 9 cm Needle insertion depth: 8 cm Assessment Sensory level: T6 Additional Notes -heme, -para, VSS.  Pt tolertated well.  Lot and expiration date OK.

## 2016-08-17 NOTE — Discharge Instructions (Signed)

## 2016-08-17 NOTE — Anesthesia Postprocedure Evaluation (Signed)
Anesthesia Post Note  Patient: Joshua Stark  Procedure(s) Performed: Procedure(s) (LRB): LEFT TOTAL KNEE ARTHROPLASTY (Left)     Patient location during evaluation: PACU Anesthesia Type: Regional and Spinal Level of consciousness: oriented and awake and alert Pain management: pain level controlled Vital Signs Assessment: post-procedure vital signs reviewed and stable Respiratory status: spontaneous breathing, respiratory function stable and patient connected to nasal cannula oxygen Cardiovascular status: blood pressure returned to baseline and stable Postop Assessment: no headache and no backache Anesthetic complications: no    Last Vitals:  Vitals:   08/17/16 1100 08/17/16 1119  BP: 134/69 (!) 148/49  Pulse: (!) 55 (!) 56  Resp: 12 12  Temp: (!) 36.3 C 36.4 C    Last Pain:  Vitals:   08/17/16 1210  TempSrc:   PainSc: 5                  Ryan P Ellender

## 2016-08-17 NOTE — Transfer of Care (Signed)
Immediate Anesthesia Transfer of Care Note  Patient: Joshua Stark  Procedure(s) Performed: Procedure(s): LEFT TOTAL KNEE ARTHROPLASTY (Left)  Patient Location: PACU  Anesthesia Type:Spinal  Level of Consciousness:  sedated, patient cooperative and responds to stimulation  Airway & Oxygen Therapy:Patient Spontanous Breathing and Patient connected to face mask oxgen  Post-op Assessment:  Report given to PACU RN and Post -op Vital signs reviewed and stable  Post vital signs:  Reviewed and stable  Last Vitals:  Vitals:   08/17/16 0521  BP: (!) 144/65  Pulse: (!) 56  Resp: 16  Temp: 37.9 C    Complications: No apparent anesthesia complications

## 2016-08-18 ENCOUNTER — Encounter (HOSPITAL_COMMUNITY): Payer: Self-pay | Admitting: Orthopedic Surgery

## 2016-08-18 DIAGNOSIS — M1712 Unilateral primary osteoarthritis, left knee: Secondary | ICD-10-CM | POA: Diagnosis not present

## 2016-08-18 LAB — BASIC METABOLIC PANEL
ANION GAP: 6 (ref 5–15)
BUN: 33 mg/dL — ABNORMAL HIGH (ref 6–20)
CALCIUM: 8.4 mg/dL — AB (ref 8.9–10.3)
CO2: 23 mmol/L (ref 22–32)
Chloride: 107 mmol/L (ref 101–111)
Creatinine, Ser: 1.41 mg/dL — ABNORMAL HIGH (ref 0.61–1.24)
GFR calc Af Amer: 57 mL/min — ABNORMAL LOW (ref >60)
GFR calc non Af Amer: 49 mL/min — ABNORMAL LOW (ref >60)
GLUCOSE: 197 mg/dL — AB (ref 65–99)
Potassium: 5.4 mmol/L — ABNORMAL HIGH (ref 3.5–5.1)
Sodium: 136 mmol/L (ref 135–145)

## 2016-08-18 LAB — CBC
HEMATOCRIT: 29.8 % — AB (ref 39.0–52.0)
Hemoglobin: 10 g/dL — ABNORMAL LOW (ref 13.0–17.0)
MCH: 30.3 pg (ref 26.0–34.0)
MCHC: 33.6 g/dL (ref 30.0–36.0)
MCV: 90.3 fL (ref 78.0–100.0)
Platelets: 157 10*3/uL (ref 150–400)
RBC: 3.3 MIL/uL — ABNORMAL LOW (ref 4.22–5.81)
RDW: 13.3 % (ref 11.5–15.5)
WBC: 9.4 10*3/uL (ref 4.0–10.5)

## 2016-08-18 MED ORDER — KETOROLAC TROMETHAMINE 15 MG/ML IJ SOLN
7.5000 mg | Freq: Four times a day (QID) | INTRAMUSCULAR | Status: DC
  Administered 2016-08-18: 7.5 mg via INTRAVENOUS
  Filled 2016-08-18: qty 1

## 2016-08-18 MED ORDER — HYDROCODONE-ACETAMINOPHEN 7.5-325 MG PO TABS: 1.0000 | ORAL_TABLET | ORAL | 0 refills | Status: DC | PRN

## 2016-08-18 MED ORDER — CELECOXIB 200 MG PO CAPS: 200.0000 mg | ORAL_CAPSULE | Freq: Two times a day (BID) | ORAL | 0 refills | Status: DC

## 2016-08-18 MED ORDER — KETOROLAC TROMETHAMINE 15 MG/ML IJ SOLN: 15.0000 mg | Freq: Four times a day (QID) | INTRAMUSCULAR | Status: DC

## 2016-08-18 MED ORDER — ASPIRIN 81 MG PO CHEW: 81.0000 mg | CHEWABLE_TABLET | Freq: Two times a day (BID) | ORAL | 0 refills | Status: AC

## 2016-08-18 MED ORDER — DOCUSATE SODIUM 100 MG PO CAPS: 100.0000 mg | ORAL_CAPSULE | Freq: Two times a day (BID) | ORAL | 0 refills | Status: DC

## 2016-08-18 MED ORDER — METHOCARBAMOL 500 MG PO TABS: 500.0000 mg | ORAL_TABLET | Freq: Four times a day (QID) | ORAL | 0 refills | Status: DC | PRN

## 2016-08-18 MED ORDER — POLYETHYLENE GLYCOL 3350 17 G PO PACK: 17.0000 g | PACK | Freq: Two times a day (BID) | ORAL | 0 refills | Status: DC

## 2016-08-18 NOTE — Progress Notes (Signed)
     Subjective: 1 Day Post-Op Procedure(s) (LRB): LEFT TOTAL KNEE ARTHROPLASTY (Left)   Seen by Dr. Alvan Dame. Patient reports pain as mild, pain controlled with the knee.  States that he is having shoulder pain since surgery.  Discussed giving him Toradol for 2 doses in the hospital and send home on Celebrex.  He is instructed to mention the shoulder pain when he returns to the office if it is still bothering him.  Ready to be discharged home if he does well with PT.  Objective:   VITALS:   Vitals:   08/18/16 0240 08/18/16 0550  BP: 140/66 (!) 132/59  Pulse: 60 65  Resp: 16 16  Temp: 98.2 F (36.8 C) 97.8 F (36.6 C)    Dorsiflexion/Plantar flexion intact Incision: dressing C/D/I No cellulitis present Compartment soft  LABS  Recent Labs  08/18/16 0525  HGB 10.0*  HCT 29.8*  WBC 9.4  PLT 157     Recent Labs  08/18/16 0525  NA 136  K 5.4*  BUN 33*  CREATININE 1.41*  GLUCOSE 197*     Assessment/Plan: 1 Day Post-Op Procedure(s) (LRB): LEFT TOTAL KNEE ARTHROPLASTY (Left) Foley cath d/c'ed Advance diet Up with therapy D/C IV fluids Discharge home Follow up in 2 weeks at [facility]. Follow up with OLIN,Weylin Plagge D in 2 weeks.  Contact information:  [facility] [address], [place] [phone]    Obese (BMI 30-39.9) Estimated body mass index is 36.4 kg/m as calculated from the following:   Height as of this encounter: 5\' 11"  (1.803 m).   Weight as of this encounter: 118.4 kg (261 lb). Patient also counseled that weight may inhibit the healing process Patient counseled that losing weight will help with future health issues       [place] Pugh. Debarah Mccumbers   PAC  08/18/2016, 8:00 AM

## 2016-08-18 NOTE — Care Management Note (Signed)
Case Management Note  Patient Details  Name: Joshua Stark MRN: [id number] Date of Birth: September 19, 1945  Subjective/Objective:    71 y/o m admitted w/L TKA. From home.Has dme. PT-f/u per orthopedic surgeon recc. No  CM needs.                Action/Plan:d/c home.   Expected Discharge Date:  08/18/16               Expected Discharge Plan:  Home/Self Care  In-House Referral:     Discharge planning Services  CM Consult  Post Acute Care Choice:    Choice offered to:     DME Arranged:    DME Agency:     HH Arranged:    HH Agency:     Status of Service:  Completed, signed off  If discussed at [employer] of Stay Meetings, dates discussed:    Additional Comments:  Dessa Phi, RN 08/18/2016, 10:17 AM

## 2016-08-18 NOTE — Progress Notes (Signed)
Physical Therapy Treatment Patient Details Name: Joshua Stark MRN: [id number] DOB: [birth year] Today's Date: 08/18/2016    History of Present Illness LTKA    PT Comments    POD # 2 Spouse present during session for education on all below. Also given handout HEP.  Instructed on proper tech, freq as well as use of ICE.    Follow Up Recommendations  Outpatient PT;DC plan and follow up therapy as arranged by surgeon     Equipment Recommendations  None recommended by PT    Recommendations for Other Services       Precautions / Restrictions Precautions Precautions: Fall;Knee Restrictions Weight Bearing Restrictions: No Other Position/Activity Restrictions: WBAT    Mobility  Bed Mobility               General bed mobility comments: OOB in recliner  Transfers Overall transfer level: Needs assistance Equipment used: Rolling walker (2 wheeled) Transfers: Sit to/from Stand Sit to Stand: Supervision;Min guard         General transfer comment: <25% cues for technique and safety  Ambulation/Gait Ambulation/Gait assistance: Supervision Ambulation Distance (Feet): 85 Feet Assistive device: Rolling walker (2 wheeled) Gait Pattern/deviations: Step-to pattern;Step-through pattern;Antalgic Gait velocity: decreased   General Gait Details: <25% VC's on safety with turns    Stairs Stairs: Yes   Stair Management: Step to pattern;One rail Left;Forwards Number of Stairs: 2 General stair comments: 25% VC's on safety and proper sequencing  Wheelchair Mobility    Modified Rankin (Stroke Patients Only)       Balance                                            Cognition Arousal/Alertness: Awake/alert Behavior During Therapy: WFL for tasks assessed/performed Overall Cognitive Status: Within Functional Limits for tasks assessed                                        Exercises   Total Knee Replacement TE's 10 reps B LE ankle  pumps 10 reps towel squeezes 10 reps knee presses 10 reps heel slides  10 reps SAQ's 10 reps SLR's 10 reps ABD Followed by ICE     General Comments        Pertinent Vitals/Pain Pain Assessment: 0-10 Pain Score: 4  Pain Location: right knee Pain Descriptors / Indicators: Sore;Operative site guarding Pain Intervention(s): Monitored during session;Repositioned;Ice applied    Home Living Family/patient expects to be discharged to:: Private residence Living Arrangements: Spouse/significant other Available Help at Discharge: Family Type of Home: House Home Access: Stairs to enter Entrance Stairs-Rails: Right;Left Home Layout: Two level;Able to live on main level with bedroom/bathroom Home Equipment: Gilford Rile - 2 wheels;Cane - single point      Prior Function Level of Independence: Independent          PT Goals (current goals can now be found in the care plan section) Acute Rehab PT Goals Patient Stated Goal: to get the other knee done    Frequency    7X/week      PT Plan Current plan remains appropriate    Co-evaluation              AM-PAC PT "6 Clicks" Daily Activity  Outcome Measure  Difficulty turning over in bed (including adjusting bedclothes, sheets  and blankets)?: A Lot Difficulty moving from lying on back to sitting on the side of the bed? : A Lot Difficulty sitting down on and standing up from a chair with arms (e.g., wheelchair, bedside commode, etc,.)?: A Lot Help needed moving to and from a bed to chair (including a wheelchair)?: A Little Help needed walking in hospital room?: A Little Help needed climbing 3-5 steps with a railing? : A Little 6 Click Score: 15    End of Session Equipment Utilized During Treatment: Gait belt Activity Tolerance: Patient tolerated treatment well Patient left: in chair;with call bell/phone within reach;with family/visitor present Nurse Communication:  (ready for D/C to home) PT Visit Diagnosis: Difficulty in  walking, not elsewhere classified (R26.2);Pain     Time: 5183-3582 PT Time Calculation (min) (ACUTE ONLY): 39 min  Charges:  $Gait Training: 8-22 mins $Therapeutic Exercise: 8-22 mins $Therapeutic Activity: 8-22 mins                    G Codes:       Rica Koyanagi  PTA [facility]  Acute  Rehab Pager      [phone]

## 2016-08-18 NOTE — Evaluation (Signed)
Occupational Therapy Evaluation Patient Details Name: Joshua Stark MRN: [id number] DOB: 1945-04-10 Today's Date: 08/18/2016    History of Present Illness LTKA   Clinical Impression   OT education complete.    Follow Up Recommendations  No OT follow up    Equipment Recommendations  None recommended by OT       Precautions / Restrictions Precautions Precautions: Fall;Knee Restrictions Weight Bearing Restrictions: No      Mobility Bed Mobility                  Transfers Overall transfer level: Needs assistance Equipment used: Rolling walker (2 wheeled) Transfers: Sit to/from Stand Sit to Stand: Min assist         General transfer comment: cues for technique        ADL either performed or assessed with clinical judgement   ADL Overall ADL's : Needs assistance/impaired Eating/Feeding: Set up;Sitting   Grooming: Sitting;Set up   Upper Body Bathing: Set up;Sitting   Lower Body Bathing: Minimal assistance;Sit to/from stand;Cueing for safety;Cueing for sequencing;With caregiver independent assisting   Upper Body Dressing : Set up;Sitting   Lower Body Dressing: Minimal assistance;Sit to/from stand;Cueing for safety;Cueing for sequencing;Cueing for compensatory techniques;With caregiver independent assisting   Toilet Transfer: Supervision/safety;RW;Cueing for sequencing;Cueing for safety;Comfort height toilet   Toileting- Clothing Manipulation and Hygiene: Supervision/safety;Sit to/from stand;Cueing for safety;Cueing for sequencing   Tub/ Shower Transfer: Walk-in shower;Minimal assistance;Ambulation;Cueing for safety;Cueing for sequencing   Functional mobility during ADLs: Set up General ADL Comments: pt has needed DME and wife will A as needed     Vision Patient Visual Report: No change from baseline              Pertinent Vitals/Pain Pain Score: 2  Pain Location: right knee Pain Descriptors / Indicators: Sore Pain Intervention(s): Limited  activity within patient's tolerance;Monitored during session;Ice applied     Hand Dominance     Extremity/Trunk Assessment Upper Extremity Assessment Upper Extremity Assessment: Overall WFL for tasks assessed           Communication Communication Communication: No difficulties   Cognition Arousal/Alertness: Awake/alert Behavior During Therapy: WFL for tasks assessed/performed Overall Cognitive Status: Within Functional Limits for tasks assessed                                                Home Living Family/patient expects to be discharged to:: Private residence Living Arrangements: Spouse/significant other Available Help at Discharge: Family Type of Home: House Home Access: Stairs to enter [employer] of Steps: 3 Entrance Stairs-Rails: Right;Left Home Layout: Two level;Able to live on main level with bedroom/bathroom               Home Equipment: Gilford Rile - 2 wheels;Cane - single point          Prior Functioning/Environment Level of Independence: Independent                       OT Treatment/Interventions: Self-care/ADL training;Patient/family education;DME and/or AE instruction    OT Goals(Current goals can be found in the care plan section) Acute Rehab OT Goals Patient Stated Goal: to get the other knee done  OT Frequency:                AM-PAC PT "6 Clicks" Daily Activity     Outcome Measure Help  from another person eating meals?: None Help from another person taking care of personal grooming?: None Help from another person toileting, which includes using toliet, bedpan, or urinal?: A Little Help from another person bathing (including washing, rinsing, drying)?: A Little Help from another person to put on and taking off regular upper body clothing?: None Help from another person to put on and taking off regular lower body clothing?: A Little 6 Click Score: 21   End of Session Nurse Communication: Mobility  status  Activity Tolerance: Patient tolerated treatment well Patient left: in chair  OT Visit Diagnosis: Unsteadiness on feet (R26.81)                Time: 1045-1100 OT Time Calculation (min): 15 min Charges:  OT General Charges $OT Visit: 1 Procedure OT Evaluation $OT Eval Low Complexity: 1 Procedure G-Codes:     Kari Baars, Kemp  Payton Mccallum D 08/18/2016, 11:49 AM

## 2016-08-23 DIAGNOSIS — G8929 Other chronic pain: Secondary | ICD-10-CM | POA: Diagnosis not present

## 2016-08-23 DIAGNOSIS — M25562 Pain in left knee: Secondary | ICD-10-CM | POA: Diagnosis not present

## 2016-08-24 NOTE — Discharge Summary (Signed)
Physician Discharge Summary  Patient ID: Joshua Stark MRN: [id number] DOB/AGE: 10-23-1945 71 y.o.  Admit date: 08/17/2016 Discharge date: 08/18/2016   Procedures:  Procedure(s) (LRB): LEFT TOTAL KNEE ARTHROPLASTY (Left)  Attending Physician:  Dr. Paralee Cancel   Admission Diagnoses:   Left knee primary OA / pain  Discharge Diagnoses:  Principal Problem:   S/P left TKA Active Problems:   Obesity (BMI 30-39.9)  Past Medical History:  Diagnosis Date  . Arthritis   . CAD S/P percutaneous coronary angioplasty 1999   PCI- prox LAD @ D1, SP1 trifurcation.  3.0 mm x 16 mm AVE-GFX BMS  . History of nuclear stress test August 2012   Treadmill Myoview: 9 minutes, 10 METS --> hypertensive response.  No evidence of ischemia or infarction.  EF 64%  . HLD (hyperlipidemia)   . Hypertension   . Obesity (BMI 30-39.9)   . Seizure disorder ([place])   . Seizures ([place])    hx of years ago     HPI:    Joshua Stark, 71 y.o. male, has a history of pain and functional disability in the left knee due to arthritis and has failed non-surgical conservative treatments for greater than 12 weeks to include NSAID's and/or analgesics, corticosteriod injections, viscosupplementation injections, use of assistive devices and activity modification.  Onset of symptoms was gradual, starting 5+ years ago with gradually worsening course since that time. The patient noted no past surgery on the left knee(s).  Patient currently rates pain in the left knee(s) at 8 out of 10 with activity. Patient has night pain, worsening of pain with activity and weight bearing, pain that interferes with activities of daily living, pain with passive range of motion, crepitus and joint swelling.  Patient has evidence of periarticular osteophytes and joint space narrowing by imaging studies.  There is no active infection.  Risks, benefits and expectations were discussed with the patient.  Risks including but not limited to the risk of  anesthesia, blood clots, nerve damage, blood vessel damage, failure of the prosthesis, infection and up to and including death.  Patient understand the risks, benefits and expectations and wishes to proceed with surgery.   PCP: Jinny Sanders, MD   Discharged Condition: good  Hospital Course:  Patient underwent the above stated procedure on 08/17/2016. Patient tolerated the procedure well and brought to the recovery room in good condition and subsequently to the floor.  POD #1 BP: 132/59 ; Pulse: 65 ; Temp: 97.8 F (36.6 C) ; Resp: 16 Patient reports pain as mild, pain controlled with the knee.  States that he is having shoulder pain since surgery.  Discussed giving him Toradol for 2 doses in the hospital and send home on Celebrex.  He is instructed to mention the shoulder pain when he returns to the office if it is still bothering him.  Ready to be discharged home. Dorsiflexion/plantar flexion intact, incision: dressing C/D/I, no cellulitis present and compartment soft.   LABS  Basename    HGB     10.0  HCT     29.8    Discharge Exam: General appearance: alert, cooperative and no distress Extremities: Homans sign is negative, no sign of DVT, no edema, redness or tenderness in the calves or thighs and no ulcers, gangrene or trophic changes  Disposition: Home with follow up in 2 weeks   Follow-up Information    Paralee Cancel, MD. Schedule an appointment as soon as possible for a visit in 2 week(s).  Specialty:  Orthopedic Surgery Contact information: [address] [place] [address] [phone]           Discharge Instructions    Call MD / Call 911    Complete by:  As directed    If you experience chest pain or shortness of breath, CALL 911 and be transported to the hospital emergency room.  If you develope a fever above 101 F, pus (white drainage) or increased drainage or redness at the wound, or calf pain, call your surgeon's office.   Change dressing     Complete by:  As directed    Maintain surgical dressing until follow up in the clinic. If the edges start to pull up, may reinforce with tape. If the dressing is no longer working, may remove and cover with gauze and tape, but must Stark the area dry and clean.  Call with any questions or concerns.   Constipation Prevention    Complete by:  As directed    Drink plenty of fluids.  Prune juice may be helpful.  You may use a stool softener, such as Colace (over the counter) 100 mg twice a day.  Use MiraLax (over the counter) for constipation as needed.   Diet - low sodium heart healthy    Complete by:  As directed    Discharge instructions    Complete by:  As directed    Maintain surgical dressing until follow up in the clinic. If the edges start to pull up, may reinforce with tape. If the dressing is no longer working, may remove and cover with gauze and tape, but must Stark the area dry and clean.  Follow up in 2 weeks at [facility] Call with any questions or concerns.   Increase activity slowly as tolerated    Complete by:  As directed    Weight bearing as tolerated with assist device (walker, cane, etc) as directed, use it as long as suggested by your surgeon or therapist, typically at least 4-6 weeks.   TED hose    Complete by:  As directed    Use stockings (TED hose) for 2 weeks on both leg(s).  You may remove them at night for sleeping.      Allergies as of 08/18/2016      Reactions   Fenofibrate Other (See Comments)   Arthritis, cramping   Rosuvastatin Other (See Comments)   REACTION: cramps   Pravastatin Sodium Other (See Comments)   REACTION: cramps   Statins Other (See Comments)   Cramping      Medication List    STOP taking these medications   aspirin EC 81 MG tablet Replaced by:  aspirin 81 MG chewable tablet     TAKE these medications   amLODipine 5 MG tablet Commonly known as:  NORVASC Take 1 tablet (5 mg total) by mouth daily.   aspirin 81 MG  chewable tablet Chew 1 tablet (81 mg total) by mouth 2 (two) times daily. Replaces:  aspirin EC 81 MG tablet   celecoxib 200 MG capsule Commonly known as:  CELEBREX Take 1 capsule (200 mg total) by mouth 2 (two) times daily.   DEPAKOTE 250 MG DR tablet Generic drug:  divalproex TAKE 1 TABLET BY MOUTH TWICE A DAY   docusate sodium 100 MG capsule Commonly known as:  COLACE Take 1 capsule (100 mg total) by mouth 2 (two) times daily.   eplerenone 25 MG tablet Commonly known as:  INSPRA Take 25 mg by  mouth daily.   ezetimibe 10 MG tablet Commonly known as:  ZETIA Take 10 mg by mouth daily.   fenofibrate 160 MG tablet TAKE 1 TABLET BY MOUTH  DAILY   Garlic 341 MG Tabs Take 500 mg by mouth daily.   HYDROcodone-acetaminophen 7.5-325 MG tablet Commonly known as:  NORCO Take 1-2 tablets by mouth every 4 (four) hours as needed for moderate pain.   lisinopril 40 MG tablet Commonly known as:  PRINIVIL,ZESTRIL Take 1 tablet (40 mg total) by mouth daily. What changed:  when to take this   methocarbamol 500 MG tablet Commonly known as:  ROBAXIN Take 1 tablet (500 mg total) by mouth every 6 (six) hours as needed for muscle spasms.   NITROSTAT 0.4 MG SL tablet Generic drug:  nitroGLYCERIN DISSOLVE 1 TABLET UNDER THE TONGUE FOR CHEST PAIN. MAY REPEAT EVERY 5MINUTES UP TO 3 DOSES. IF NO RELIEF, CALL 911**   polyethylene glycol packet Commonly known as:  MIRALAX / GLYCOLAX Take 17 g by mouth 2 (two) times daily.        Signed: [place] Pugh. Bellina Tokarczyk   PA-C  08/24/2016, 12:21 PM

## 2016-08-26 DIAGNOSIS — G8929 Other chronic pain: Secondary | ICD-10-CM | POA: Diagnosis not present

## 2016-08-26 DIAGNOSIS — M25562 Pain in left knee: Secondary | ICD-10-CM | POA: Diagnosis not present

## 2016-08-26 NOTE — Progress Notes (Signed)
   08/18/16 1255  OT G-codes **NOT FOR INPATIENT CLASS**  Functional Assessment Tool Used Clinical judgement  Functional Limitation Self care  Self Care Current Status [phone]) CJ  Self Care Goal Status (E9528) CI  Self Care Discharge Status (U1324) CJ

## 2016-09-02 DIAGNOSIS — M25562 Pain in left knee: Secondary | ICD-10-CM | POA: Diagnosis not present

## 2016-09-02 DIAGNOSIS — G8929 Other chronic pain: Secondary | ICD-10-CM | POA: Diagnosis not present

## 2016-09-11 ENCOUNTER — Encounter: Payer: Self-pay | Admitting: Family Medicine

## 2016-09-11 ENCOUNTER — Ambulatory Visit (INDEPENDENT_AMBULATORY_CARE_PROVIDER_SITE_OTHER): Payer: 59 | Admitting: Family Medicine

## 2016-09-11 VITALS — BP 130/70 | HR 65 | Temp 98.1°F | Wt 252.8 lb

## 2016-09-11 DIAGNOSIS — T402X5A Adverse effect of other opioids, initial encounter: Secondary | ICD-10-CM | POA: Diagnosis not present

## 2016-09-11 DIAGNOSIS — K5903 Drug induced constipation: Secondary | ICD-10-CM | POA: Diagnosis not present

## 2016-09-11 MED ORDER — POLYETHYLENE GLYCOL 3350 17 GM/SCOOP PO POWD: 17.0000 g | Freq: Two times a day (BID) | ORAL | 1 refills | Status: DC | PRN

## 2016-09-11 NOTE — Progress Notes (Signed)
   Subjective:    Patient ID: Joshua Stark, male    DOB: 10/16/1945, 71 y.o.   MRN: [id number]  HPI Constipation- pt reports he had a 'squeezing' in his bottom but couldn't go the bathroom x2 days.  Did a Fleets enema- took 45 minutes to work, had 4-5 small BMs and another small BM this AM.  Took a bottle of 'mineral spirits'.  Also took Miralax and drank warm prune juice.  BM this morning was 'water w/ a little stool in it'.  On pain meds due to knee surgery.  On senekot and colace.     Review of Systems For ROS see HPI     Objective:   Physical Exam  Constitutional: He is oriented to person, place, and time. He appears well-developed and well-nourished. No distress.  HENT:  Head: Normocephalic and atraumatic.  Pulmonary/Chest: Effort normal and breath sounds normal. No respiratory distress. He has no wheezes.  Abdominal: Soft. Bowel sounds are normal. There is no tenderness. There is no rebound and no guarding.  Soft, NT, mild distention, bowel sounds active and normal in all 4 quadrants  Genitourinary:  Genitourinary Comments: Deferred at pt's request  Neurological: He is alert and oriented to person, place, and time.  Skin: Skin is warm and dry.  Psychiatric: He has a normal mood and affect. His behavior is normal. Thought content normal.  Vitals reviewed.         Assessment & Plan:  OIC- new.  Pt had recent knee replacement and is now struggling w/ OIC.  He had 4-5 small BMs after his enema last night and another small BM this AM.  He and his wife are very concerned about the risk of possible infxn due to recent TKR.  Reassured them that vitals were good, his belly exam was reassuring, and it was a good sign that things were starting to move.  Encouraged increased fluids, increasing Miralax to twice daily.  Starting a MoM/Prune Juice cocktail and repeating enema PRN.  Reviewed supportive care and red flags that should prompt return.  Pt expressed understanding and is in  agreement w/ plan.

## 2016-09-11 NOTE — Patient Instructions (Signed)
Follow up as needed or as scheduled Increase the Miralax to twice daily until you are having daily BMs This weekend, take 1 dose of Milk of Magnesia (OTC) and then 5 oz of warm prune juice.  If no relief, repeat in 2 hours.  Do this both days If no relief, do a 2nd Fleets Enema Sunday afternoon/evening If still no relief, touch base w/ primary doctor on Monday Drink LOTS of water to help things move! Call with any questions or concerns- particularly if things are worsening or you develop pain Hang in there!!!

## 2016-09-13 ENCOUNTER — Ambulatory Visit (INDEPENDENT_AMBULATORY_CARE_PROVIDER_SITE_OTHER): Payer: 59 | Admitting: Family Medicine

## 2016-09-13 ENCOUNTER — Encounter: Payer: Self-pay | Admitting: Family Medicine

## 2016-09-13 ENCOUNTER — Telehealth: Payer: Self-pay

## 2016-09-13 VITALS — BP 128/60 | HR 67 | Temp 98.6°F | Ht 69.25 in | Wt 254.2 lb

## 2016-09-13 DIAGNOSIS — K5903 Drug induced constipation: Secondary | ICD-10-CM

## 2016-09-13 NOTE — Telephone Encounter (Signed)
Per chart review tab pt seen at [facility] and pt has appt 09/13/16 to see Dr Lorelei Pont.

## 2016-09-13 NOTE — Telephone Encounter (Signed)
PLEASE NOTE: All timestamps contained within this report are represented as [place] Standard Time. CONFIDENTIALTY NOTICE: This fax transmission is intended only for the addressee. It contains information that is legally privileged, confidential or otherwise protected from use or disclosure. If you are not the intended recipient, you are strictly prohibited from reviewing, disclosing, copying using or disseminating any of this information or taking any action in reliance on or regarding this information. If you have received this fax in error, please notify us immediately by telephone so that we can arrange for its return to [place]. Phone: [phone], Toll-Free: [phone], Fax: [phone] Page: 1 of 1 Call Id: [id number] [place] Patient Name: Joshua Stark Gender: Male DOB: 1945/10/19 Age: 71 Y 9 M 3 D Return Phone Number: [phone] (Primary), [phone] (Secondary) City/State/Zip: [place] [address] Client [facility] Night - Client Client Site [place] Physician Eliezer Lofts - MD Who Is Calling Patient / Member / Family / Caregiver Call Type Triage / Clinical Relationship To Patient Self Return Phone Number [phone] (Secondary) Chief Complaint Constipation Reason for Call Symptomatic / Request for Marty had knee surgery, now constipated, nauses. Nurse Assessment Nurse: Germain Osgood RN, Opal Sidles Date/Time Eilene Ghazi Time): 09/10/2016 7:19:22 PM Confirm and document reason for call. If symptomatic, describe symptoms. ---Caller states he had knee surgery on 7/24 now constipated. Has been several days. has taken Miralax X 2 today. Last evening took whole bottle of Mineral oil X 2 . Is nauseated. Does the PT have any chronic conditions? (i.e. diabetes, asthma, etc.) ---Yes List chronic conditions.  ---High Blood Pressure on meds, high cholesterol. Guidelines Guideline Title Affirmed Question Constipation Abdomen is more swollen than usual Disp. Time Eilene Ghazi Time) Disposition Final User 09/10/2016 7:27:11 PM See Physician within 24 Hours Yes Germain Osgood, RN, Opal Sidles Referrals [facility] Care Advice Given Per Guideline FLEET ENEMA: A Fleet phosphate enema may be helpful if you suspect that stool is trapped in the rectum (fecal impaction). * STEP 2 - ENEMA BOTTLE. Shake well and remove the cap from the enema bottle tip. * STEP 3 - INSERTING TIP OF ENEMA BOTTLE. Lubricate the tip of the enema tube with water or small amount Vaseline. Gently insert tip of enema tube into rectum about 1-2 inches (3-5 cm). Gently squeeze bottle so as to slowly push the fluid into the rectum. * STEP 5 - FLUID COMES BACK OUT. Patient (if able) should sit on toilet. Fluid and stool should come out. Enemas usually work in 5-20 minutes, but can take longer. * Do not use if there is fever, abdominal pain, or rectal bleeding. * Do not use if you have heart disease, kidney disease, or inflammatory bowel disease. * Do not use if you have neutropenia (very low white cell count). CALL BACK IF: * Severe or increasing abdominal pain * Constant abdominal pain lasting over 2 hours * Vomiting occurs * You become worse. CARE ADVICE given per Constipation (Adult) guideline. SEE PHYSICIAN WITHIN 24 HOURS:

## 2016-09-13 NOTE — Progress Notes (Signed)
Dr. Frederico Hamman T. Nox Talent, MD, [facility] [place], [address] Phone: [phone] Fax: [phone]  09/13/2016  Patient: Joshua Stark, MRN: [id number], DOB: [birth year], 71 y.o.  Primary Physician:  Jinny Sanders, MD   Chief Complaint  Patient presents with  . Constipation    from pain medicaiton   Subjective:   Joshua Stark is a 71 y.o. very pleasant male patient who presents with the following:  08/17/2016 - Total knee arthroplasty.   No BM, Thursday or Friday and called the nurse line. Tried some things that did not really work. [facility]. Is taking Colace and sennokot. Added MOM and prune juice with incomplete relief.  Past Medical History, Surgical History, Social History, Family History, Problem List, Medications, and Allergies have been reviewed and updated if relevant.  Patient Active Problem List   Diagnosis Date Noted  . S/P left TKA 08/17/2016  . S/P total knee replacement 08/17/2016  . Bradycardia 03/19/2014  . Aortic ejection murmur 09/18/2013  . CKD (chronic kidney disease) stage 3, GFR 30-59 ml/min 05/01/2013  . CAD S/P percutaneous coronary angioplasty   . Essential hypertension   . Hyperlipidemia with target LDL less than 70   . Obesity (BMI 30-39.9)   . Hyperkalemia 07/27/2011  . TESTICULAR HYPOFUNCTION 07/30/2009  . Anemia of chronic disease 07/29/2009  . CONSTIPATION 07/07/2009  . ABDOMINAL PAIN, LEFT LOWER QUADRANT 07/07/2009  . SEIZURE DISORDER 10/13/2006    Past Medical History:  Diagnosis Date  . Arthritis   . CAD S/P percutaneous coronary angioplasty 1999   PCI- prox LAD @ D1, SP1 trifurcation.  3.0 mm x 16 mm AVE-GFX BMS  . History of nuclear stress test August 2012   Treadmill Myoview: 9 minutes, 10 METS --> hypertensive response.  No evidence of ischemia or infarction.  EF 64%  . HLD (hyperlipidemia)   . Hypertension   . Obesity (BMI 30-39.9)   . Seizure  disorder ([place])   . Seizures ([place])    hx of years ago     Past Surgical History:  Procedure Laterality Date  . CARDIAC CATHETERIZATION  01/20/1998  . CORONARY ANGIOPLASTY  01/20/1998   PCI to LAD ,prox LAD at trifurcation for D1and SP1.3.0-x16-mm AVE-GFX bare-metal stent  . NM MYOCAR PERF WALL MOTION  Aug 2 ,2012   treadmill myoview exercised 9 minutes, reacing 10 metaboblic equivlents,had ahypertensive reponse .EF64%  . TOTAL KNEE ARTHROPLASTY Left 08/17/2016   Procedure: LEFT TOTAL KNEE ARTHROPLASTY;  Surgeon: Paralee Cancel, MD;  Location: [facility];  Service: Orthopedics;  Laterality: Left;  . TRANSTHORACIC ECHOCARDIOGRAM  09/20/2013   normal LV size and function. EF 60-65%. Normal diastolic function. Mild LA dilation and mild AI.  No evidence of aortic stenosis. No significant MR.    Social History   Social History  . Marital status: Married    Spouse name: N/A  . Number of children: 2  . Years of education: N/A   Occupational History  . Not on file.   Social History Main Topics  . Smoking status: Never Smoker  . Smokeless tobacco: Never Used  . Alcohol use No  . Drug use: No  . Sexual activity: Not on file   Other Topics Concern  . Not on file   Social History Narrative   He is a married father of 2, grandfather 22.   He usually walks 5 days a week.   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  He does a lot of activity and strenuous labor with this as well.   He never smoked, and does not drink alcohol.    Family History  Problem Relation Age of Onset  . Hypertension Mother   . Cancer Father        lung and prostate    Allergies  Allergen Reactions  . Fenofibrate Other (See Comments)    Arthritis, cramping   . Rosuvastatin Other (See Comments)    REACTION: cramps   . Pravastatin Sodium Other (See Comments)    REACTION: cramps  . Statins Other (See Comments)    Cramping     Medication list reviewed and updated in full in  [place].   GEN: No acute illnesses, no fevers, chills. GI: No n/v/d, eating normally Pulm: No SOB Interactive and getting along well at home.  Otherwise, ROS is as per the HPI.  Objective:   BP 128/60   Pulse 67   Temp 98.6 F (37 C) (Oral)   Ht 5' 9.25" (1.759 m)   Wt 254 lb 4 oz (115.3 kg)   BMI 37.28 kg/m   GEN: WDWN, NAD, Non-toxic, A & O x 3 HEENT: Atraumatic, Normocephalic. Neck supple. No masses, No LAD. Ears and Nose: No external deformity. CV: RRR, No M/G/R. No JVD. No thrill. No extra heart sounds. PULM: CTA B, no wheezes, crackles, rhonchi. No retractions. No resp. distress. No accessory muscle use. ABD: S, NT,  Mild D, + BS, No rebound, No HSM  EXTR: No c/c/e NEURO Normal gait.  PSYCH: Normally interactive. Conversant. Not depressed or anxious appearing.  Calm demeanor.   Laboratory and Imaging Data:  Assessment and Plan:   Constipation due to pain medication  Increase miralax until BM Increase sennokot  Patient Instructions  CONSTIPATION  1. Increase the sennakot to 2 tablets  Twice a day. You will want to the do this the whole time you are taking significant amount of pain medicine.   2. Miralax: Increase the dose to 4 times per day today.  If no BM, then increase to 5 times per day the next day.  If no BM, then increase to 6 times per day. Increase the dosing until you have had a good full BM.    Stop miralax after good bm  Follow-up: No Follow-up on file.  Future Appointments Date Time Provider [place]  11/03/2016 9:00 AM Eustace Pen, LPN [facility]  11/12/2016 8:30 AM Jinny Sanders, MD [facility]    Signed,  Maud Deed. Lylian Sanagustin, MD   Allergies as of 09/13/2016      Reactions   Fenofibrate Other (See Comments)   Arthritis, cramping   Rosuvastatin Other (See Comments)   REACTION: cramps   Pravastatin Sodium Other (See Comments)   REACTION: cramps   Statins Other (See Comments)    Cramping      Medication List       Accurate as of 09/13/16  1:55 PM. Always use your most recent med list.          amLODipine 5 MG tablet Commonly known as:  NORVASC Take 1 tablet (5 mg total) by mouth daily.   aspirin 81 MG chewable tablet Chew 1 tablet (81 mg total) by mouth 2 (two) times daily.   celecoxib 200 MG capsule Commonly known as:  CELEBREX Take 1 capsule (200 mg total) by mouth 2 (two) times daily.   DEPAKOTE  250 MG DR tablet Generic drug:  divalproex TAKE 1 TABLET BY MOUTH TWICE A DAY   docusate sodium 100 MG capsule Commonly known as:  COLACE Take 1 capsule (100 mg total) by mouth 2 (two) times daily.   eplerenone 25 MG tablet Commonly known as:  INSPRA Take 25 mg by mouth daily.   ezetimibe 10 MG tablet Commonly known as:  ZETIA Take 10 mg by mouth daily.   Garlic 509 MG Tabs Take 500 mg by mouth daily.   HYDROcodone-acetaminophen 7.5-325 MG tablet Commonly known as:  NORCO Take 1-2 tablets by mouth every 4 (four) hours as needed for moderate pain.   lisinopril 40 MG tablet Commonly known as:  PRINIVIL,ZESTRIL Take 1 tablet (40 mg total) by mouth daily.   methocarbamol 500 MG tablet Commonly known as:  ROBAXIN Take 1 tablet (500 mg total) by mouth every 6 (six) hours as needed for muscle spasms.   NITROSTAT 0.4 MG SL tablet Generic drug:  nitroGLYCERIN DISSOLVE 1 TABLET UNDER THE TONGUE FOR CHEST PAIN. MAY REPEAT EVERY 5MINUTES UP TO 3 DOSES. IF NO RELIEF, CALL 911**   polyethylene glycol powder powder Commonly known as:  GLYCOLAX/MIRALAX Take 17 g by mouth 2 (two) times daily as needed.

## 2016-09-13 NOTE — Patient Instructions (Signed)
CONSTIPATION  1. Increase the sennakot to 2 tablets  Twice a day. You will want to the do this the whole time you are taking significant amount of pain medicine.   2. Miralax: Increase the dose to 4 times per day today.  If no BM, then increase to 5 times per day the next day.  If no BM, then increase to 6 times per day. Increase the dosing until you have had a good full BM.

## 2016-11-02 ENCOUNTER — Telehealth: Payer: Self-pay | Admitting: Family Medicine

## 2016-11-02 DIAGNOSIS — Z125 Encounter for screening for malignant neoplasm of prostate: Secondary | ICD-10-CM

## 2016-11-02 DIAGNOSIS — I10 Essential (primary) hypertension: Secondary | ICD-10-CM

## 2016-11-02 DIAGNOSIS — D638 Anemia in other chronic diseases classified elsewhere: Secondary | ICD-10-CM

## 2016-11-02 DIAGNOSIS — E785 Hyperlipidemia, unspecified: Secondary | ICD-10-CM

## 2016-11-02 NOTE — Telephone Encounter (Signed)
-----   Message from Eustace Pen, LPN sent at 22/0/2542 11:40 AM EDT ----- Regarding: Labs 10/10 Lab orders needed. Thank you.  Insurance: [facility] Medicare

## 2016-11-03 ENCOUNTER — Ambulatory Visit (INDEPENDENT_AMBULATORY_CARE_PROVIDER_SITE_OTHER): Payer: 59

## 2016-11-03 VITALS — BP 140/80 | HR 53 | Temp 97.8°F | Ht 69.25 in | Wt 258.0 lb

## 2016-11-03 DIAGNOSIS — D638 Anemia in other chronic diseases classified elsewhere: Secondary | ICD-10-CM

## 2016-11-03 DIAGNOSIS — E785 Hyperlipidemia, unspecified: Secondary | ICD-10-CM | POA: Diagnosis not present

## 2016-11-03 DIAGNOSIS — Z0000 Encounter for general adult medical examination without abnormal findings: Secondary | ICD-10-CM | POA: Diagnosis not present

## 2016-11-03 DIAGNOSIS — Z125 Encounter for screening for malignant neoplasm of prostate: Secondary | ICD-10-CM

## 2016-11-03 LAB — COMPREHENSIVE METABOLIC PANEL
ALT: 15 U/L (ref 0–53)
AST: 17 U/L (ref 0–37)
Albumin: 4.1 g/dL (ref 3.5–5.2)
Alkaline Phosphatase: 65 U/L (ref 39–117)
BILIRUBIN TOTAL: 0.4 mg/dL (ref 0.2–1.2)
BUN: 33 mg/dL — ABNORMAL HIGH (ref 6–23)
CHLORIDE: 109 meq/L (ref 96–112)
CO2: 26 meq/L (ref 19–32)
Calcium: 9.9 mg/dL (ref 8.4–10.5)
Creatinine, Ser: 1.17 mg/dL (ref 0.40–1.50)
GFR: 65.52 mL/min (ref >60.00)
GLUCOSE: 97 mg/dL (ref 70–99)
POTASSIUM: 5.3 meq/L — AB (ref 3.5–5.1)
Sodium: 141 mEq/L (ref 135–145)
Total Protein: 6.2 g/dL (ref 6.0–8.3)

## 2016-11-03 LAB — CBC WITH DIFFERENTIAL/PLATELET
BASOS PCT: 1 % (ref 0.0–3.0)
Basophils Absolute: 0 10*3/uL (ref 0.0–0.1)
EOS PCT: 3.3 % (ref 0.0–5.0)
Eosinophils Absolute: 0.1 10*3/uL (ref 0.0–0.7)
HEMATOCRIT: 36.5 % — AB (ref 39.0–52.0)
HEMOGLOBIN: 12.1 g/dL — AB (ref 13.0–17.0)
LYMPHS ABS: 1.9 10*3/uL (ref 0.7–4.0)
LYMPHS PCT: 47.7 % — AB (ref 12.0–46.0)
MCHC: 33.1 g/dL (ref 30.0–36.0)
MCV: 92.1 fl (ref 78.0–100.0)
MONOS PCT: 8.8 % (ref 3.0–12.0)
Monocytes Absolute: 0.3 10*3/uL (ref 0.1–1.0)
NEUTROS ABS: 1.6 10*3/uL (ref 1.4–7.7)
Neutrophils Relative %: 39.2 % — ABNORMAL LOW (ref 43.0–77.0)
Platelets: 190 10*3/uL (ref 150.0–400.0)
RBC: 3.97 Mil/uL — ABNORMAL LOW (ref 4.22–5.81)
RDW: 14 % (ref 11.5–15.5)
WBC: 4 10*3/uL (ref 4.0–10.5)

## 2016-11-03 LAB — LIPID PANEL
CHOL/HDL RATIO: 3
Cholesterol: 170 mg/dL (ref 0–200)
HDL: 50.7 mg/dL (ref >39.00)
LDL CALC: 105 mg/dL — AB (ref 0–99)
NONHDL: 119.56
Triglycerides: 71 mg/dL (ref 0.0–149.0)
VLDL: 14.2 mg/dL (ref 0.0–40.0)

## 2016-11-03 LAB — PSA, MEDICARE: PSA: 0.43 ng/ml (ref 0.10–4.00)

## 2016-11-03 NOTE — Progress Notes (Signed)
Subjective:   Joshua Stark is a 71 y.o. male who presents for an Initial Medicare Annual Wellness Visit.  Review of Systems  N/A Cardiac Risk Factors include: advanced age (>81men, >57 women);male gender;hypertension;dyslipidemia;obesity (BMI >30kg/m2)    Objective:    Today's Vitals   11/03/16 0932 11/03/16 0940  BP: 140/80   Pulse: (!) 53   Temp: 97.8 F (36.6 C)   TempSrc: Oral   SpO2: 97%   Weight: 258 lb (117 kg)   Height: 5' 9.25" (1.759 m)   PainSc: 1  1   PainLoc: Knee    Body mass index is 37.83 kg/m.  Current Medications (verified) Outpatient Encounter Prescriptions as of 11/03/2016  Medication Sig  . amLODipine (NORVASC) 5 MG tablet Take 1 tablet (5 mg total) by mouth daily.  Marland Kitchen DEPAKOTE 250 MG DR tablet TAKE 1 TABLET BY MOUTH TWICE A DAY  . eplerenone (INSPRA) 25 MG tablet Take 25 mg by mouth daily.  Marland Kitchen ezetimibe (ZETIA) 10 MG tablet Take 10 mg by mouth daily.  . Garlic 644 MG TABS Take 500 mg by mouth daily.  Marland Kitchen lisinopril (PRINIVIL,ZESTRIL) 40 MG tablet Take 1 tablet (40 mg total) by mouth daily. (Patient taking differently: Take 40 mg by mouth at bedtime. )  . NITROSTAT 0.4 MG SL tablet DISSOLVE 1 TABLET UNDER THE TONGUE FOR CHEST PAIN. MAY REPEAT EVERY 5MINUTES UP TO 3 DOSES. IF NO RELIEF, CALL 911**  . [DISCONTINUED] celecoxib (CELEBREX) 200 MG capsule Take 1 capsule (200 mg total) by mouth 2 (two) times daily.  . [DISCONTINUED] docusate sodium (COLACE) 100 MG capsule Take 1 capsule (100 mg total) by mouth 2 (two) times daily.  . [DISCONTINUED] HYDROcodone-acetaminophen (NORCO) 7.5-325 MG tablet Take 1-2 tablets by mouth every 4 (four) hours as needed for moderate pain.  . [DISCONTINUED] methocarbamol (ROBAXIN) 500 MG tablet Take 1 tablet (500 mg total) by mouth every 6 (six) hours as needed for muscle spasms.  . [DISCONTINUED] polyethylene glycol powder (GLYCOLAX/MIRALAX) powder Take 17 g by mouth 2 (two) times daily as needed.   No facility-administered  encounter medications on file as of 11/03/2016.     Allergies (verified) Fenofibrate; Rosuvastatin; Pravastatin sodium; and Statins   History: Past Medical History:  Diagnosis Date  . Arthritis   . CAD S/P percutaneous coronary angioplasty 1999   PCI- prox LAD @ D1, SP1 trifurcation.  3.0 mm x 16 mm AVE-GFX BMS  . History of nuclear stress test August 2012   Treadmill Myoview: 9 minutes, 10 METS --> hypertensive response.  No evidence of ischemia or infarction.  EF 64%  . HLD (hyperlipidemia)   . Hypertension   . Obesity (BMI 30-39.9)   . Seizure disorder ([place])   . Seizures ([place])    hx of years ago    Past Surgical History:  Procedure Laterality Date  . CARDIAC CATHETERIZATION  01/20/1998  . CORONARY ANGIOPLASTY  01/20/1998   PCI to LAD ,prox LAD at trifurcation for D1and SP1.3.0-x16-mm AVE-GFX bare-metal stent  . NM MYOCAR PERF WALL MOTION  Aug 2 ,2012   treadmill myoview exercised 9 minutes, reacing 10 metaboblic equivlents,had ahypertensive reponse .EF64%  . TOTAL KNEE ARTHROPLASTY Left 08/17/2016   Procedure: LEFT TOTAL KNEE ARTHROPLASTY;  Surgeon: Paralee Cancel, MD;  Location: [facility];  Service: Orthopedics;  Laterality: Left;  . TRANSTHORACIC ECHOCARDIOGRAM  09/20/2013   normal LV size and function. EF 60-65%. Normal diastolic function. Mild LA dilation and mild AI.  No evidence of aortic stenosis.  No significant MR.   Family History  Problem Relation Age of Onset  . Hypertension Mother   . Cancer Father        lung and prostate   Social History   Occupational History  . Not on file.   Social History Main Topics  . Smoking status: Never Smoker  . Smokeless tobacco: Never Used  . Alcohol use No  . Drug use: No  . Sexual activity: Not on file   Tobacco Counseling Counseling given: No   Activities of Daily Living In your present state of health, do you have any difficulty performing the following activities: 11/03/2016 08/17/2016  Hearing? N N  Vision? N N    Difficulty concentrating or making decisions? N N  Walking or climbing stairs? N Y  Dressing or bathing? N N  Doing errands, shopping? N N  Preparing Food and eating ? N -  Using the Toilet? N -  In the past six months, have you accidently leaked urine? N -  Do you have problems with loss of bowel control? N -  Managing your Medications? N -  Managing your Finances? N -  Housekeeping or managing your Housekeeping? N -  Some recent data might be hidden    Immunizations and Health Maintenance Immunization History  Administered Date(s) Administered  . Influenza Split 11/26/2010, 11/12/2011  . Influenza Whole 10/25/2008, 10/21/2009  . Influenza,inj,Quad PF,6+ Mos 10/31/2012, 11/01/2013, 11/05/2014, 11/11/2015  . Pneumococcal Conjugate-13 11/01/2013  . Pneumococcal Polysaccharide-23 11/05/2014  . Td 08/06/2003  . Tdap 11/01/2013   There are no preventive care reminders to display for this patient.  Patient Care Team: Jinny Sanders, MD as PCP - General  Indicate any recent Medical Services you may have received from other than [facility] providers in the past year (date may be approximate).    Assessment:   This is a routine wellness examination for Joshua Stark.   Hearing/Vision screen  Hearing Screening   125Hz  250Hz  500Hz  1000Hz  2000Hz  3000Hz  4000Hz  6000Hz  8000Hz   Right ear:   40 0 40  0    Left ear:   40 40 40  0    Vision Screening Comments: Last vision exam in Nov 2017  Dietary issues and exercise activities discussed: Current Exercise Habits: Home exercise routine, Type of exercise: walking, Time (Minutes): 30, Frequency (Times/Week): 7, Weekly Exercise (Minutes/Week): 210, Intensity: Mild, Exercise limited by: None identified  Goals    . Increase physical activity          Starting 11/03/2016, I will continue to walk at least 30 min daily.       Depression Screen PHQ 2/9 Scores 11/03/2016 05/03/2014 11/01/2013  PHQ - 2 Score 0 0 0  PHQ- 9 Score 0 - -    Fall Risk Fall  Risk  11/03/2016 11/01/2013  Falls in the past year? No No    Cognitive Function: MMSE - Mini Mental State Exam 11/03/2016  Orientation to time 5  Orientation to Place 5  Registration 3  Attention/ Calculation 0  Recall 3  Language- name 2 objects 0  Language- repeat 1  Language- follow 3 step command 3  Language- read & follow direction 0  Write a sentence 0  Copy design 0  Total score 20     PLEASE NOTE: A Mini-Cog screen was completed. Maximum score is 20. A value of 0 denotes this part of [place] MMSE was not completed or the patient failed this part of the Mini-Cog screening.  Mini-Cog Screening Orientation to Time - Max 5 pts Orientation to Place - Max 5 pts Registration - Max 3 pts Recall - Max 3 pts Language Repeat - Max 1 pts Language Follow 3 Step Command - Max 3 pts     Screening Tests Health Maintenance  Topic Date Due  . INFLUENZA VACCINE  04/24/2017 (Originally 08/25/2016)  . COLONOSCOPY  12/10/2020  . TETANUS/TDAP  11/02/2023  . Hepatitis C Screening  Completed  . PNA vac Low Risk Adult  Completed        Plan:   I have personally reviewed and addressed the Medicare Annual Wellness questionnaire and have noted the following in the patient's chart:  A. Medical and social history B. Use of alcohol, tobacco or illicit drugs  C. Current medications and supplements D. Functional ability and status E.  Nutritional status F.  Physical activity G. Advance directives H. List of other physicians I.  Hospitalizations, surgeries, and ER visits in previous 12 months J.  [place] to include hearing, vision, cognitive, depression L. Referrals and appointments - none  In addition, I have reviewed and discussed with patient certain preventive protocols, quality metrics, and best practice recommendations. A written personalized care plan for preventive services as well as general preventive health recommendations were provided to patient.  See  attached scanned questionnaire for additional information.   Signed,   Lindell Noe, MHA, BS, LPN Health Coach

## 2016-11-03 NOTE — Patient Instructions (Signed)
Joshua Stark , Thank you for taking time to come for your Medicare Wellness Visit. I appreciate your ongoing commitment to your health goals. Please review the following plan we discussed and let me know if I can assist you in the future.   These are the goals we discussed: Goals    . Increase physical activity          Starting 11/03/2016, I will continue to walk at least 30 min daily.        This is a list of the screening recommended for you and due dates:  Health Maintenance  Topic Date Due  . Flu Shot  04/24/2017*  . Colon Cancer Screening  12/10/2020  . Tetanus Vaccine  11/02/2023  .  Hepatitis C: One time screening is recommended by Center for Disease Control  (CDC) for  adults born from [birth year] through 1964.   Completed  . Pneumonia vaccines  Completed  *Topic was postponed. The date shown is not the original due date.   .Preventive Care for Adults  A healthy lifestyle and preventive care can promote health and wellness. Preventive health guidelines for adults include the following key practices.  . A routine yearly physical is a good way to check with your health care provider about your health and preventive screening. It is a chance to share any concerns and updates on your health and to receive a thorough exam.  . Visit your dentist for a routine exam and preventive care every 6 months. Brush your teeth twice a day and floss once a day. Good oral hygiene prevents tooth decay and gum disease.  . The frequency of eye exams is based on your age, health, family medical history, use  of contact lenses, and other factors. Follow your health care provider's ecommendations for frequency of eye exams.  . Eat a healthy diet. Foods like vegetables, fruits, whole grains, low-fat dairy products, and lean protein foods contain the nutrients you need without too many calories. Decrease your intake of foods high in solid fats, added sugars, and salt. Eat the right amount of calories for you.  Get information about a proper diet from your health care provider, if necessary.  . Regular physical exercise is one of the most important things you can do for your health. Most adults should get at least 150 minutes of moderate-intensity exercise (any activity that increases your heart rate and causes you to sweat) each week. In addition, most adults need muscle-strengthening exercises on 2 or more days a week.  Silver Sneakers may be a benefit available to you. To determine eligibility, you may visit the website: www.silversneakers.com or contact program at 860 006 1021 Mon-Fri between 8AM-8PM.   . Maintain a healthy weight. The body mass index (BMI) is a screening tool to identify possible weight problems. It provides an estimate of body fat based on height and weight. Your health care provider can find your BMI and can help you achieve or maintain a healthy weight.   For adults 20 years and older: ? A BMI below 18.5 is considered underweight. ? A BMI of 18.5 to 24.9 is normal. ? A BMI of 25 to 29.9 is considered overweight. ? A BMI of 30 and above is considered obese.   . Maintain normal blood lipids and cholesterol levels by exercising and minimizing your intake of saturated fat. Eat a balanced diet with plenty of fruit and vegetables. Blood tests for lipids and cholesterol should begin at age 50 and be repeated every  5 years. If your lipid or cholesterol levels are high, you are over 50, or you are at high risk for heart disease, you may need your cholesterol levels checked more frequently. Ongoing high lipid and cholesterol levels should be treated with medicines if diet and exercise are not working.  . If you smoke, find out from your health care provider how to quit. If you do not use tobacco, please do not start.  . If you choose to drink alcohol, please do not consume more than 2 drinks per day. One drink is considered to be 12 ounces (355 mL) of beer, 5 ounces (148 mL) of wine, or  1.5 ounces (44 mL) of liquor.  . If you are 53-11 years old, ask your health care provider if you should take aspirin to prevent strokes.  . Use sunscreen. Apply sunscreen liberally and repeatedly throughout the day. You should seek shade when your shadow is shorter than you. Protect yourself by wearing long sleeves, pants, a wide-brimmed hat, and sunglasses year round, whenever you are outdoors.  . Once a month, do a whole body skin exam, using a mirror to look at the skin on your back. Tell your health care provider of new moles, moles that have irregular borders, moles that are larger than a pencil eraser, or moles that have changed in shape or color.

## 2016-11-03 NOTE — Progress Notes (Signed)
Pre visit review using our clinic review tool, if applicable. No additional management support is needed unless otherwise documented below in the visit note. 

## 2016-11-03 NOTE — Progress Notes (Signed)
PCP notes:   Health maintenance:  Flu vaccine - PCP please administer at CPE  Abnormal screenings:   Hearing - failed  Hearing Screening   125Hz  250Hz  500Hz  1000Hz  2000Hz  3000Hz  4000Hz  6000Hz  8000Hz   Right ear:   40 0 40  0    Left ear:   40 40 40  0      Patient concerns:   None  Nurse concerns:  None  Next PCP appt:   10/30 @ 1115

## 2016-11-04 NOTE — Progress Notes (Signed)
I reviewed health advisor's note, was available for consultation, and agree with documentation and plan.  

## 2016-11-12 ENCOUNTER — Encounter: Payer: 59 | Admitting: Family Medicine

## 2016-11-23 ENCOUNTER — Encounter: Payer: Self-pay | Admitting: Family Medicine

## 2016-11-23 ENCOUNTER — Ambulatory Visit (INDEPENDENT_AMBULATORY_CARE_PROVIDER_SITE_OTHER): Payer: 59 | Admitting: Family Medicine

## 2016-11-23 VITALS — BP 130/64 | HR 53 | Temp 98.0°F | Ht 69.25 in | Wt 259.2 lb

## 2016-11-23 DIAGNOSIS — I10 Essential (primary) hypertension: Secondary | ICD-10-CM | POA: Diagnosis not present

## 2016-11-23 DIAGNOSIS — Z0000 Encounter for general adult medical examination without abnormal findings: Secondary | ICD-10-CM | POA: Diagnosis not present

## 2016-11-23 DIAGNOSIS — E6609 Other obesity due to excess calories: Secondary | ICD-10-CM | POA: Diagnosis not present

## 2016-11-23 DIAGNOSIS — Z23 Encounter for immunization: Secondary | ICD-10-CM

## 2016-11-23 DIAGNOSIS — N183 Chronic kidney disease, stage 3 unspecified: Secondary | ICD-10-CM

## 2016-11-23 DIAGNOSIS — Z9861 Coronary angioplasty status: Secondary | ICD-10-CM | POA: Diagnosis not present

## 2016-11-23 DIAGNOSIS — E785 Hyperlipidemia, unspecified: Secondary | ICD-10-CM | POA: Diagnosis not present

## 2016-11-23 DIAGNOSIS — I2510 Atherosclerotic heart disease of native coronary artery without angina pectoris: Secondary | ICD-10-CM

## 2016-11-23 DIAGNOSIS — Z6830 Body mass index (BMI) 30.0-30.9, adult: Secondary | ICD-10-CM

## 2016-11-23 DIAGNOSIS — E875 Hyperkalemia: Secondary | ICD-10-CM

## 2016-11-23 DIAGNOSIS — D638 Anemia in other chronic diseases classified elsewhere: Secondary | ICD-10-CM | POA: Diagnosis not present

## 2016-11-23 NOTE — Progress Notes (Signed)
Subjective:    Patient ID: Joshua Stark, male    DOB: December 25, 1945, 71 y.o.   MRN: [id number]  HPI   The patient presents for annual medicare wellness, complete physical and review of chronic health problems. He/She also has the following acute concerns today:  The patient saw Candis Musa, LPN for medicare wellness. Note reviewed in detail and important notes copied below. Health maintenance:  Flu vaccine - PCP please administer at CPE Abnormal screenings:  Hearing - failed             Hearing Screening   125Hz  250Hz  500Hz  1000Hz  2000Hz  3000Hz  4000Hz  6000Hz  8000Hz   Right ear:   40 0 40  0    Left ear:   40 40 40  0      11/23/16 Today:  Hypertension:    At [place] on amlodipine, eplerenone, lisinopril BP Readings from Last 3 Encounters:  11/23/16 130/64  11/03/16 140/80  09/13/16 128/60  Using medication without problems or lightheadedness:  none Chest pain with exertion: none Edema: none Short of breath: none Average home BPs: Other issues:  Elevated Cholesterol:  Statin intolerant, on zetia cardiology considering injectable medicaiton Lab Results  Component Value Date   CHOL 170 11/03/2016   HDL 50.70 11/03/2016   LDLCALC 105 (H) 11/03/2016   TRIG 71.0 11/03/2016   CHOLHDL 3 11/03/2016  Using medications without problems: Muscle aches:  Diet compliance: moderate Exercise: walking   Wt Readings from Last 3 Encounters:  11/23/16 259 lb 4 oz (117.6 kg)  11/03/16 258 lb (117 kg)  09/13/16 254 lb 4 oz (115.3 kg)   Other complaints: potassium slightly elevated.  CRD: stable  anemia of chronic disease  CAD s/p PCI followed by cardiology, Dr. Alethia Berthold at [facility] S/P left knee replacement with Dr. Alvan Dame in 07/2016  he has complete rehab, minimal pain.   Social History /Family History/Past Medical History reviewed in detail and updated in EMR if needed. Blood pressure 130/64, pulse (!) 53, temperature 98 F (36.7 C), temperature source Oral,  height 5' 9.25" (1.759 m), weight 259 lb 4 oz (117.6 kg), SpO2 97 %.    Review of Systems  Constitutional: Negative for fatigue and fever.  HENT: Negative for ear pain.   Eyes: Negative for pain.  Respiratory: Negative for cough and shortness of breath.   Cardiovascular: Negative for chest pain, palpitations and leg swelling.  Gastrointestinal: Negative for abdominal pain.  Genitourinary: Negative for dysuria.  Musculoskeletal: Negative for arthralgias.  Neurological: Negative for syncope, light-headedness and headaches.  Psychiatric/Behavioral: Negative for dysphoric mood.       Objective:   Physical Exam  Constitutional: He appears well-developed and well-nourished.  Non-toxic appearance. He does not appear ill. No distress.  HENT:  Head: Normocephalic and atraumatic.  Right Ear: Hearing, tympanic membrane, external ear and ear canal normal.  Left Ear: Hearing, tympanic membrane, external ear and ear canal normal.  Nose: Nose normal.  Mouth/Throat: Uvula is midline, oropharynx is clear and moist and mucous membranes are normal.  Eyes: Pupils are equal, round, and reactive to light. Conjunctivae, EOM and lids are normal. Lids are everted and swept, no foreign bodies found.  Neck: Trachea normal, normal range of motion and phonation normal. Neck supple. Carotid bruit is not present. No thyroid mass and no thyromegaly present.  Cardiovascular: Normal rate, regular rhythm, S1 normal, S2 normal, intact distal pulses and normal pulses.  Exam reveals no gallop.   No murmur heard. Pulmonary/Chest: Breath sounds normal. He  has no wheezes. He has no rhonchi. He has no rales.  Abdominal: Soft. Normal appearance and bowel sounds are normal. There is no hepatosplenomegaly. There is no tenderness. There is no rebound, no guarding and no CVA tenderness. No hernia.  Lymphadenopathy:    He has no cervical adenopathy.  Neurological: He is alert. He has normal strength and normal reflexes. No  cranial nerve deficit or sensory deficit. Gait normal.  Skin: Skin is warm, dry and intact. No rash noted.  Psychiatric: He has a normal mood and affect. His speech is normal and behavior is normal. Judgment normal.          Assessment & Plan:  The patient's preventative maintenance and recommended screening tests for an annual wellness exam were reviewed in full today. Brought up to date unless services declined.  Counselled on the importance of diet, exercise, and its role in overall health and mortality. The patient's FH and SH was reviewed, including their home life, tobacco status, and drug and alcohol status.   Stable PSA. Lab Results  Component Value Date   PSA 0.43 11/03/2016   PSA 0.32 10/29/2015   PSA 0.80 10/30/2014  Colon: 11/2015 tubular adenoma, repeat 5 years Vaccines:up to date prevnar, pneumovax and Tdap, consider shingles. Given flu shot today. Nonsmoker. HEP C: neg

## 2016-11-23 NOTE — Addendum Note (Signed)
Addended by: Elmon Kirschner A on: 11/23/2016 12:08 PM   Modules accepted: Orders

## 2016-11-23 NOTE — Assessment & Plan Note (Signed)
Improved

## 2016-11-23 NOTE — Assessment & Plan Note (Signed)
Likely secondary to CKD, ACE I and diet. Stop multivitamin.. Stay off.  Recheck in 3-4 weeks.

## 2016-11-23 NOTE — Assessment & Plan Note (Signed)
Followed by Cardiology. Last note reviewed Dr. Alethia Berthold.

## 2016-11-23 NOTE — Assessment & Plan Note (Signed)
Well controlled. Continue current medication.  

## 2016-11-23 NOTE — Patient Instructions (Addendum)
Hold multivitamin given it contains potassium. Avoid potassium supplements, and high potassium foods.  If cardiology does not check potassium.. Return for lab only visit to recheck potassium in 2-4 weeks.  Work on low cholesterol diet, weight loss and  Increasing exercise to lower cholesterol.

## 2016-11-23 NOTE — Assessment & Plan Note (Signed)
Encouraged exercise, weight loss, healthy eating habits. ? ?

## 2016-11-23 NOTE — Assessment & Plan Note (Signed)
Inadequate control. Statin intolerant.  Cardiology considering injectable medication.

## 2016-11-23 NOTE — Assessment & Plan Note (Signed)
Stable on no med.

## 2016-12-15 DIAGNOSIS — N183 Chronic kidney disease, stage 3 (moderate): Secondary | ICD-10-CM | POA: Diagnosis not present

## 2016-12-15 DIAGNOSIS — E782 Mixed hyperlipidemia: Secondary | ICD-10-CM | POA: Diagnosis not present

## 2016-12-15 DIAGNOSIS — I10 Essential (primary) hypertension: Secondary | ICD-10-CM | POA: Diagnosis not present

## 2016-12-15 DIAGNOSIS — I2510 Atherosclerotic heart disease of native coronary artery without angina pectoris: Secondary | ICD-10-CM | POA: Diagnosis not present

## 2017-01-03 ENCOUNTER — Other Ambulatory Visit: Payer: Self-pay | Admitting: Family Medicine

## 2017-01-04 NOTE — Telephone Encounter (Signed)
Last office visit 11/23/16.  Last Depakote Level 01/01/16.  Last refilled 07/02/16 for #60 with 5 refills.  Ok to refill?

## 2017-01-05 ENCOUNTER — Other Ambulatory Visit: Payer: Self-pay | Admitting: Family Medicine

## 2017-01-05 NOTE — Telephone Encounter (Signed)
Lab appointment scheduled 01/11/2017 at 8:40 am for Depakote Level.

## 2017-01-05 NOTE — Telephone Encounter (Signed)
Needs depakote level recheck.. Have him schedule but I will refill x 1 month

## 2017-01-11 ENCOUNTER — Other Ambulatory Visit: Payer: 59

## 2017-02-02 ENCOUNTER — Other Ambulatory Visit: Payer: Self-pay | Admitting: Family Medicine

## 2017-02-02 NOTE — Telephone Encounter (Signed)
Last office visit 11/23/2016.  Last Depakote level 01/01/2016.  Last refilled 01/05/2017 for #60.  Ok to refill?

## 2017-02-03 ENCOUNTER — Other Ambulatory Visit (INDEPENDENT_AMBULATORY_CARE_PROVIDER_SITE_OTHER): Payer: 59

## 2017-02-03 DIAGNOSIS — G40909 Epilepsy, unspecified, not intractable, without status epilepticus: Secondary | ICD-10-CM

## 2017-02-03 NOTE — Telephone Encounter (Signed)
Pt needs to come in for depakote level. Okay to refill once scheduled given previous levels have been at goal at same dose.

## 2017-02-03 NOTE — Telephone Encounter (Signed)
Lab appointment scheduled today 02/03/2017 at 3:00pm to check Depakote level.

## 2017-02-04 LAB — VALPROIC ACID LEVEL: VALPROIC ACID LVL: 36.3 mg/L — AB (ref 50.0–100.0)

## 2017-03-11 ENCOUNTER — Other Ambulatory Visit: Payer: Self-pay | Admitting: Family Medicine

## 2017-04-11 ENCOUNTER — Other Ambulatory Visit: Payer: Self-pay | Admitting: Family Medicine

## 2017-04-25 ENCOUNTER — Ambulatory Visit: Payer: 59 | Admitting: Cardiology

## 2017-06-17 DIAGNOSIS — I10 Essential (primary) hypertension: Secondary | ICD-10-CM | POA: Diagnosis not present

## 2017-06-17 DIAGNOSIS — N183 Chronic kidney disease, stage 3 (moderate): Secondary | ICD-10-CM | POA: Diagnosis not present

## 2017-06-17 DIAGNOSIS — I2510 Atherosclerotic heart disease of native coronary artery without angina pectoris: Secondary | ICD-10-CM | POA: Diagnosis not present

## 2017-06-17 DIAGNOSIS — I129 Hypertensive chronic kidney disease with stage 1 through stage 4 chronic kidney disease, or unspecified chronic kidney disease: Secondary | ICD-10-CM | POA: Diagnosis not present

## 2017-06-17 DIAGNOSIS — Z789 Other specified health status: Secondary | ICD-10-CM | POA: Diagnosis not present

## 2017-06-17 DIAGNOSIS — Z7982 Long term (current) use of aspirin: Secondary | ICD-10-CM | POA: Diagnosis not present

## 2017-06-17 DIAGNOSIS — E782 Mixed hyperlipidemia: Secondary | ICD-10-CM | POA: Diagnosis not present

## 2017-06-17 DIAGNOSIS — Z96659 Presence of unspecified artificial knee joint: Secondary | ICD-10-CM | POA: Diagnosis not present

## 2017-06-17 DIAGNOSIS — Z79899 Other long term (current) drug therapy: Secondary | ICD-10-CM | POA: Diagnosis not present

## 2017-12-14 DIAGNOSIS — E782 Mixed hyperlipidemia: Secondary | ICD-10-CM | POA: Diagnosis not present

## 2017-12-14 DIAGNOSIS — Z789 Other specified health status: Secondary | ICD-10-CM | POA: Diagnosis not present

## 2017-12-14 DIAGNOSIS — I10 Essential (primary) hypertension: Secondary | ICD-10-CM | POA: Diagnosis not present

## 2017-12-14 DIAGNOSIS — I2510 Atherosclerotic heart disease of native coronary artery without angina pectoris: Secondary | ICD-10-CM | POA: Diagnosis not present

## 2018-02-13 ENCOUNTER — Telehealth: Payer: Self-pay | Admitting: Family Medicine

## 2018-02-13 ENCOUNTER — Encounter: Payer: Self-pay | Admitting: *Deleted

## 2018-02-13 NOTE — Telephone Encounter (Signed)
Spoke with Joshua Stark.  He states he received letter that states his insurance will not cover the Depakote due to it not being on formulary.  He states he has tried the generic in the past but that did not work for him.  He just got a refill so he is good right now.  I advised to continue taking his prescribed medication and next time he needs a refill, his insurance will deny it and the pharmacy with send [place] a form to do a PA request.   We will do PA at that time to try and get it approved.  Patient states understanding.

## 2018-02-13 NOTE — Telephone Encounter (Signed)
Pt's wife called office stating they received a letter from the insurance denying the Depakote. It is not on the formulary. Pt's wife wants to know if it has been followed up on.

## 2018-02-13 NOTE — Telephone Encounter (Addendum)
I went ahead and completed PA on CoverMyMeds.  Sent to [employer] for review.  Can take up to 72 hours for a decision.  Mr. Kubicek is past due for an office visit.  Will have Robin call and schedule MWV with Fara Boros and CPE with Dr. Diona Browner.

## 2018-02-14 NOTE — Telephone Encounter (Signed)
PA approved effective 01/23/2018 through 01/25/2019.

## 2018-02-16 NOTE — Telephone Encounter (Signed)
Medicare wellness with lisa 3/25 cpx with dr Diona Browner 3/31 Pt aware

## 2018-02-28 ENCOUNTER — Other Ambulatory Visit: Payer: Self-pay | Admitting: Family Medicine

## 2018-04-19 ENCOUNTER — Ambulatory Visit: Payer: 59

## 2018-04-25 ENCOUNTER — Encounter: Payer: 59 | Admitting: Family Medicine

## 2018-05-28 ENCOUNTER — Other Ambulatory Visit: Payer: Self-pay | Admitting: Family Medicine

## 2018-05-29 NOTE — Telephone Encounter (Signed)
Last office visit 11/23/2016 for CPE.  Last refilled 02/28/2018 for #60 with 2 refills.  Last Depakote level 02/03/2017.  CPE scheduled for 07/04/2018.

## 2018-05-30 NOTE — Telephone Encounter (Signed)
Has been extremely stable.. okay to wait until CPX to do level.

## 2018-06-27 ENCOUNTER — Telehealth: Payer: Self-pay | Admitting: Family Medicine

## 2018-06-27 DIAGNOSIS — G40909 Epilepsy, unspecified, not intractable, without status epilepticus: Secondary | ICD-10-CM

## 2018-06-27 DIAGNOSIS — D638 Anemia in other chronic diseases classified elsewhere: Secondary | ICD-10-CM

## 2018-06-27 DIAGNOSIS — E785 Hyperlipidemia, unspecified: Secondary | ICD-10-CM

## 2018-06-27 NOTE — Telephone Encounter (Signed)
-----   Message from Ellamae Sia sent at 06/26/2018 11:04 AM EDT ----- Regarding: Lab orders for Wednesday, 6.3.20 Lab orders

## 2018-06-28 ENCOUNTER — Other Ambulatory Visit (INDEPENDENT_AMBULATORY_CARE_PROVIDER_SITE_OTHER): Payer: Medicare HMO

## 2018-06-28 ENCOUNTER — Ambulatory Visit (INDEPENDENT_AMBULATORY_CARE_PROVIDER_SITE_OTHER): Payer: Medicare HMO

## 2018-06-28 DIAGNOSIS — D638 Anemia in other chronic diseases classified elsewhere: Secondary | ICD-10-CM

## 2018-06-28 DIAGNOSIS — E785 Hyperlipidemia, unspecified: Secondary | ICD-10-CM

## 2018-06-28 DIAGNOSIS — G40909 Epilepsy, unspecified, not intractable, without status epilepticus: Secondary | ICD-10-CM

## 2018-06-28 DIAGNOSIS — Z0000 Encounter for general adult medical examination without abnormal findings: Secondary | ICD-10-CM | POA: Diagnosis not present

## 2018-06-28 LAB — LIPID PANEL
Cholesterol: 149 mg/dL (ref 0–200)
HDL: 46.8 mg/dL (ref >39.00)
LDL Cholesterol: 86 mg/dL (ref 0–99)
NonHDL: 102.28
Total CHOL/HDL Ratio: 3
Triglycerides: 82 mg/dL (ref 0.0–149.0)
VLDL: 16.4 mg/dL (ref 0.0–40.0)

## 2018-06-28 LAB — COMPREHENSIVE METABOLIC PANEL
ALT: 12 U/L (ref 0–53)
AST: 15 U/L (ref 0–37)
Albumin: 3.9 g/dL (ref 3.5–5.2)
Alkaline Phosphatase: 69 U/L (ref 39–117)
BUN: 37 mg/dL — ABNORMAL HIGH (ref 6–23)
CO2: 26 mEq/L (ref 19–32)
Calcium: 9.3 mg/dL (ref 8.4–10.5)
Chloride: 107 mEq/L (ref 96–112)
Creatinine, Ser: 1.29 mg/dL (ref 0.40–1.50)
GFR: 54.82 mL/min — ABNORMAL LOW (ref >60.00)
Glucose, Bld: 95 mg/dL (ref 70–99)
Potassium: 5.3 mEq/L — ABNORMAL HIGH (ref 3.5–5.1)
Sodium: 139 mEq/L (ref 135–145)
Total Bilirubin: 0.4 mg/dL (ref 0.2–1.2)
Total Protein: 6.1 g/dL (ref 6.0–8.3)

## 2018-06-28 LAB — CBC WITH DIFFERENTIAL/PLATELET
Basophils Absolute: 0 10*3/uL (ref 0.0–0.1)
Basophils Relative: 0.6 % (ref 0.0–3.0)
Eosinophils Absolute: 0.2 10*3/uL (ref 0.0–0.7)
Eosinophils Relative: 4.2 % (ref 0.0–5.0)
HCT: 37.3 % — ABNORMAL LOW (ref 39.0–52.0)
Hemoglobin: 12.5 g/dL — ABNORMAL LOW (ref 13.0–17.0)
Lymphocytes Relative: 49.7 % — ABNORMAL HIGH (ref 12.0–46.0)
Lymphs Abs: 2.1 10*3/uL (ref 0.7–4.0)
MCHC: 33.7 g/dL (ref 30.0–36.0)
MCV: 91 fl (ref 78.0–100.0)
Monocytes Absolute: 0.4 10*3/uL (ref 0.1–1.0)
Monocytes Relative: 9 % (ref 3.0–12.0)
Neutro Abs: 1.6 10*3/uL (ref 1.4–7.7)
Neutrophils Relative %: 36.5 % — ABNORMAL LOW (ref 43.0–77.0)
Platelets: 183 10*3/uL (ref 150.0–400.0)
RBC: 4.1 Mil/uL — ABNORMAL LOW (ref 4.22–5.81)
RDW: 14 % (ref 11.5–15.5)
WBC: 4.3 10*3/uL (ref 4.0–10.5)

## 2018-06-28 LAB — TESTOSTERONE: Testosterone: 246.11 ng/dL — ABNORMAL LOW (ref 300.00–890.00)

## 2018-06-28 LAB — IBC + FERRITIN
Ferritin: 137.2 ng/mL (ref 22.0–322.0)
Iron: 78 ug/dL (ref 42–165)
Saturation Ratios: 26.3 % (ref 20.0–50.0)
Transferrin: 212 mg/dL (ref 212.0–360.0)

## 2018-06-28 NOTE — Progress Notes (Signed)
Subjective:   Joshua Stark is a 73 y.o. male who presents for Medicare Annual/Subsequent preventive examination.  Review of Systems:  N/A Cardiac Risk Factors include: advanced age (>29men, >14 women);male gender;obesity (BMI >30kg/m2);hypertension     Objective:    Vitals: There were no vitals taken for this visit.  There is no height or weight on file to calculate BMI.  Advanced Directives 06/28/2018 11/03/2016 08/17/2016 08/17/2016 08/11/2016  Does Patient Have a Medical Advance Directive? Yes Yes Yes Yes Yes  Type of Paramedic of [place];Living will [place];Living will [place];Living will [place];Living will [place];Living will  Does patient want to make changes to medical advance directive? No - Patient declined - No - Patient declined - -  Copy of [place] in Chart? No - copy requested No - copy requested No - copy requested No - copy requested No - copy requested    Tobacco Social History   Tobacco Use  Smoking Status Never Smoker  Smokeless Tobacco Never Used     Counseling given: No   Clinical Intake:  Pre-visit preparation completed: Yes  Pain : No/denies pain Pain Score: 0-No pain     Nutritional Status: BMI > 30  Obese Nutritional Risks: None Diabetes: No  How often do you need to have someone help you when you read instructions, pamphlets, or other written materials from your doctor or pharmacy?: 1 - Never What is the last grade level you completed in school?: 10th grade  Interpreter Needed?: No  Comments: pt lives with spouse Information entered by :: LPinson, LPN  Past Medical History:  Diagnosis Date  . Arthritis   . CAD S/P percutaneous coronary angioplasty 1999   PCI- prox LAD @ D1, SP1 trifurcation.  3.0 mm x 16 mm AVE-GFX BMS  . History of nuclear stress test August 2012   Treadmill Myoview: 9 minutes, 10 METS  --> hypertensive response.  No evidence of ischemia or infarction.  EF 64%  . HLD (hyperlipidemia)   . Hypertension   . Obesity (BMI 30-39.9)   . Seizure disorder ([place])   . Seizures ([place])    hx of years ago    Past Surgical History:  Procedure Laterality Date  . CARDIAC CATHETERIZATION  01/20/1998  . CORONARY ANGIOPLASTY  01/20/1998   PCI to LAD ,prox LAD at trifurcation for D1and SP1.3.0-x16-mm AVE-GFX bare-metal stent  . NM MYOCAR PERF WALL MOTION  Aug 2 ,2012   treadmill myoview exercised 9 minutes, reacing 10 metaboblic equivlents,had ahypertensive reponse .EF64%  . TOTAL KNEE ARTHROPLASTY Left 08/17/2016   Procedure: LEFT TOTAL KNEE ARTHROPLASTY;  Surgeon: Paralee Cancel, MD;  Location: [facility];  Service: Orthopedics;  Laterality: Left;  . TRANSTHORACIC ECHOCARDIOGRAM  09/20/2013   normal LV size and function. EF 60-65%. Normal diastolic function. Mild LA dilation and mild AI.  No evidence of aortic stenosis. No significant MR.   Family History  Problem Relation Age of Onset  . Hypertension Mother   . Cancer Father        lung and prostate   Social History   Socioeconomic History  . Marital status: Married    Spouse name: Not on file  . Number of children: 2  . Years of education: Not on file  . Highest education level: Not on file  Occupational History  . Not on file  Social Needs  . Financial resource strain: Not on file  .  Food insecurity:    Worry: Not on file    Inability: Not on file  . Transportation needs:    Medical: Not on file    Non-medical: Not on file  Tobacco Use  . Smoking status: Never Smoker  . Smokeless tobacco: Never Used  Substance and Sexual Activity  . Alcohol use: No    Alcohol/week: 0.0 standard drinks  . Drug use: No  . Sexual activity: Not on file  Lifestyle  . Physical activity:    Days per week: Not on file    Minutes per session: Not on file  . Stress: Not on file  Relationships  . Social connections:    Talks on phone: Not on  file    Gets together: Not on file    Attends religious service: Not on file    Active member of club or organization: Not on file    Attends meetings of clubs or organizations: Not on file    Relationship status: Not on file  Other Topics Concern  . Not on file  Social History Narrative   He is a married father of 2, grandfather 36.   He usually walks 5 days a week.   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  He does a lot of activity and strenuous labor with this as well.   He never smoked, and does not drink alcohol.    Outpatient Encounter Medications as of 06/28/2018  Medication Sig  . amLODipine (NORVASC) 5 MG tablet Take 1 tablet (5 mg total) by mouth daily.  Marland Kitchen aspirin EC 81 MG tablet Take by mouth.  . DEPAKOTE 250 MG DR tablet TAKE 1 TABLET BY MOUTH TWICE A DAY  . eplerenone (INSPRA) 25 MG tablet Take 25 mg by mouth daily.  Marland Kitchen ezetimibe (ZETIA) 10 MG tablet Take 10 mg by mouth daily.  . Garlic 532 MG TABS Take 500 mg by mouth daily.  Marland Kitchen lisinopril (PRINIVIL,ZESTRIL) 40 MG tablet Take 1 tablet (40 mg total) by mouth daily. (Patient taking differently: Take 40 mg by mouth at bedtime. )  . Multiple Vitamin (MULTIVITAMIN) capsule Take by mouth.  Marland Kitchen NITROSTAT 0.4 MG SL tablet DISSOLVE 1 TABLET UNDER THE TONGUE FOR CHEST PAIN. MAY REPEAT EVERY 5MINUTES UP TO 3 DOSES. IF NO RELIEF, CALL 911**  . Red Yeast Rice Extract 600 MG CAPS Take by mouth.   No facility-administered encounter medications on file as of 06/28/2018.     Activities of Daily Living In your present state of health, do you have any difficulty performing the following activities: 06/28/2018  Hearing? N  Vision? N  Difficulty concentrating or making decisions? N  Walking or climbing stairs? N  Dressing or bathing? N  Doing errands, shopping? N  Preparing Food and eating ? N  Using the Toilet? N  In the past six months, have you accidently leaked urine? N  Do you have problems with loss of  bowel control? N  Managing your Medications? N  Managing your Finances? N  Housekeeping or managing your Housekeeping? N  Some recent data might be hidden    Patient Care Team: Jinny Sanders, MD as PCP - General   Assessment:   This is a routine wellness examination for Joshua Stark.  Vision Screening Comments: Vision exam in 2019 with Dr. Marica Otter  Exercise Activities and Dietary recommendations Current Exercise Habits: Home exercise routine, Type of exercise: walking, Time (Minutes): 30, Frequency (Times/Week): 3, Weekly Exercise (Minutes/Week):  90, Intensity: Mild, Exercise limited by: None identified  Goals    . Increase physical activity     Starting 06/28/2018, I will continue to walk 2-3 days per week.        Fall Risk Fall Risk  06/28/2018 11/03/2016 11/01/2013  Falls in the past year? 0 No No   Depression Screen PHQ 2/9 Scores 06/28/2018 11/03/2016 05/03/2014 11/01/2013  PHQ - 2 Score 0 0 0 0  PHQ- 9 Score 0 0 - -    Cognitive Function MMSE - Mini Mental State Exam 06/28/2018 11/03/2016  Orientation to time 5 5  Orientation to Place 5 5  Registration 3 3  Attention/ Calculation 0 0  Recall 3 3  Language- name 2 objects 0 0  Language- repeat 1 1  Language- follow 3 step command 0 3  Language- read & follow direction 0 0  Write a sentence 0 0  Copy design 0 0  Total score 17 20     PLEASE NOTE: A Mini-Cog screen was completed. Maximum score is 17. A value of 0 denotes this part of [place] MMSE was not completed or the patient failed this part of the Mini-Cog screening.   Mini-Cog Screening Orientation to Time - Max 5 pts Orientation to Place - Max 5 pts Registration - Max 3 pts Recall - Max 3 pts Language Repeat - Max 1 pts      Immunization History  Administered Date(s) Administered  . Influenza Split 11/26/2010, 11/12/2011  . Influenza Whole 10/25/2008, 10/21/2009  . Influenza,inj,Quad PF,6+ Mos 10/31/2012, 11/01/2013, 11/05/2014, 11/11/2015, 11/23/2016   . Influenza-Unspecified 12/14/2017  . Pneumococcal Conjugate-13 11/01/2013  . Pneumococcal Polysaccharide-23 11/05/2014  . Td 08/06/2003  . Tdap 11/01/2013    Screening Tests Health Maintenance  Topic Date Due  . INFLUENZA VACCINE  08/26/2018  . COLONOSCOPY  12/10/2020  . TETANUS/TDAP  11/02/2023  . Hepatitis C Screening  Completed  . PNA vac Low Risk Adult  Completed       Plan:     I have personally reviewed, addressed, and noted the following in the patient's chart:  A. Medical and social history B. Use of alcohol, tobacco or illicit drugs  C. Current medications and supplements D. Functional ability and status E.  Nutritional status F.  Physical activity G. Advance directives H. List of other physicians I.  Hospitalizations, surgeries, and ER visits in previous 12 months J.  Vitals (unless it is a telemedicine encounter) K. Screenings to include cognitive, depression, hearing, vision (NOTE: hearing and vision screenings not completed in telemedicine encounter) L. Referrals and appointments   In addition, I have reviewed and discussed with patient certain preventive protocols, quality metrics, and best practice recommendations. A written personalized care plan for preventive services and recommendations were provided to patient.  With patient's permission, we connected on 06/28/18 at 11:00 AM EDT by a video enabled telemedicine application. Two patient identifiers were used to ensure the encounter occurred with the correct person.    Patient was in home and writer was in office.   Signed,   Lindell Noe, MHA, BS, LPN Health Coach

## 2018-06-28 NOTE — Progress Notes (Signed)
PCP notes:   Health maintenance:  No gaps identified  Abnormal screenings:   None  Patient concerns:   None  Nurse concerns:  None  Next PCP appt:   07/04/18 @ 1000

## 2018-06-28 NOTE — Progress Notes (Signed)
I reviewed health advisor's note, was available for consultation, and agree with documentation and plan.   Signed,  Cayla Wiegand T. Rosser [place], MD  

## 2018-06-28 NOTE — Patient Instructions (Signed)
Joshua Stark , Thank you for taking time to come for your Medicare Wellness Visit. I appreciate your ongoing commitment to your health goals. Please review the following plan we discussed and let me know if I can assist you in the future.   These are the goals we discussed: Goals    . Increase physical activity     Starting 06/28/2018, I will continue to walk 2-3 days per week.        This is a list of the screening recommended for you and due dates:  Health Maintenance  Topic Date Due  . Flu Shot  08/26/2018  . Colon Cancer Screening  12/10/2020  . Tetanus Vaccine  11/02/2023  .  Hepatitis C: One time screening is recommended by Center for Disease Control  (CDC) for  adults born from [birth year] through 1964.   Completed  . Pneumonia vaccines  Completed   Preventive Care for Adults  A healthy lifestyle and preventive care can promote health and wellness. Preventive health guidelines for adults include the following key practices.  . A routine yearly physical is a good way to check with your health care provider about your health and preventive screening. It is a chance to share any concerns and updates on your health and to receive a thorough exam.  . Visit your dentist for a routine exam and preventive care every 6 months. Brush your teeth twice a day and floss once a day. Good oral hygiene prevents tooth decay and gum disease.  . The frequency of eye exams is based on your age, health, family medical history, use  of contact lenses, and other factors. Follow your health care provider's recommendations for frequency of eye exams.  . Eat a healthy diet. Foods like vegetables, fruits, whole grains, low-fat dairy products, and lean protein foods contain the nutrients you need without too many calories. Decrease your intake of foods high in solid fats, added sugars, and salt. Eat the right amount of calories for you. Get information about a proper diet from your health care provider, if  necessary.  . Regular physical exercise is one of the most important things you can do for your health. Most adults should get at least 150 minutes of moderate-intensity exercise (any activity that increases your heart rate and causes you to sweat) each week. In addition, most adults need muscle-strengthening exercises on 2 or more days a week.  Silver Sneakers may be a benefit available to you. To determine eligibility, you may visit the website: www.silversneakers.com or contact program at 717-658-5631 Mon-Fri between 8AM-8PM.   . Maintain a healthy weight. The body mass index (BMI) is a screening tool to identify possible weight problems. It provides an estimate of body fat based on height and weight. Your health care provider can find your BMI and can help you achieve or maintain a healthy weight.   For adults 20 years and older: ? A BMI below 18.5 is considered underweight. ? A BMI of 18.5 to 24.9 is normal. ? A BMI of 25 to 29.9 is considered overweight. ? A BMI of 30 and above is considered obese.   . Maintain normal blood lipids and cholesterol levels by exercising and minimizing your intake of saturated fat. Eat a balanced diet with plenty of fruit and vegetables. Blood tests for lipids and cholesterol should begin at age 62 and be repeated every 5 years. If your lipid or cholesterol levels are high, you are over 50, or you are at high  risk for heart disease, you may need your cholesterol levels checked more frequently. Ongoing high lipid and cholesterol levels should be treated with medicines if diet and exercise are not working.  . If you smoke, find out from your health care provider how to quit. If you do not use tobacco, please do not start.  . If you choose to drink alcohol, please do not consume more than 2 drinks per day. One drink is considered to be 12 ounces (355 mL) of beer, 5 ounces (148 mL) of wine, or 1.5 ounces (44 mL) of liquor.  . If you are 10-62 years old, ask your  health care provider if you should take aspirin to prevent strokes.  . Use sunscreen. Apply sunscreen liberally and repeatedly throughout the day. You should seek shade when your shadow is shorter than you. Protect yourself by wearing long sleeves, pants, a wide-brimmed hat, and sunglasses year round, whenever you are outdoors.  . Once a month, do a whole body skin exam, using a mirror to look at the skin on your back. Tell your health care provider of new moles, moles that have irregular borders, moles that are larger than a pencil eraser, or moles that have changed in shape or color.

## 2018-06-29 LAB — VALPROIC ACID LEVEL: Valproic Acid Lvl: 35.8 mg/L — ABNORMAL LOW (ref 50.0–100.0)

## 2018-07-04 ENCOUNTER — Ambulatory Visit (INDEPENDENT_AMBULATORY_CARE_PROVIDER_SITE_OTHER): Payer: Medicare HMO | Admitting: Family Medicine

## 2018-07-04 ENCOUNTER — Other Ambulatory Visit: Payer: Self-pay

## 2018-07-04 ENCOUNTER — Encounter: Payer: Self-pay | Admitting: Family Medicine

## 2018-07-04 VITALS — BP 120/60 | HR 64 | Temp 98.0°F | Ht 69.0 in | Wt 269.0 lb

## 2018-07-04 DIAGNOSIS — N183 Chronic kidney disease, stage 3 unspecified: Secondary | ICD-10-CM

## 2018-07-04 DIAGNOSIS — T50905A Adverse effect of unspecified drugs, medicaments and biological substances, initial encounter: Secondary | ICD-10-CM | POA: Diagnosis not present

## 2018-07-04 DIAGNOSIS — E875 Hyperkalemia: Secondary | ICD-10-CM

## 2018-07-04 DIAGNOSIS — I10 Essential (primary) hypertension: Secondary | ICD-10-CM | POA: Diagnosis not present

## 2018-07-04 DIAGNOSIS — E785 Hyperlipidemia, unspecified: Secondary | ICD-10-CM

## 2018-07-04 DIAGNOSIS — Z0000 Encounter for general adult medical examination without abnormal findings: Secondary | ICD-10-CM | POA: Diagnosis not present

## 2018-07-04 DIAGNOSIS — G40909 Epilepsy, unspecified, not intractable, without status epilepticus: Secondary | ICD-10-CM

## 2018-07-04 DIAGNOSIS — D638 Anemia in other chronic diseases classified elsewhere: Secondary | ICD-10-CM

## 2018-07-04 DIAGNOSIS — R7989 Other specified abnormal findings of blood chemistry: Secondary | ICD-10-CM

## 2018-07-04 NOTE — Assessment & Plan Note (Signed)
Minimally symptomatic, not interested in uro referral for testosterone treatment at this time.

## 2018-07-04 NOTE — Assessment & Plan Note (Signed)
Increase water intake

## 2018-07-04 NOTE — Assessment & Plan Note (Signed)
Well controlled. Continue current medication.  

## 2018-07-04 NOTE — Assessment & Plan Note (Addendum)
Stable. Nml iron levels

## 2018-07-04 NOTE — Assessment & Plan Note (Signed)
Almost at goal, on red yeast rice and zetia. Statin intolerant.

## 2018-07-04 NOTE — Progress Notes (Signed)
Chief Complaint  Patient presents with  . Annual Exam    Part 2    History of Present Illness: HPI  The patient presents for  complete physical and review of chronic health problems. He/She also has the following acute concerns today:NONE Has been seen by Dr. Morton Stall with [facility] for last 2 years.  The patient saw Candis Musa, LPN for medicare wellness. Note reviewed in detail and important notes copied below. Health maintenance: No gaps identified Abnormal screenings:  None  07/04/18  Hypertension: At goal on current regimen  amlodipine, eplerenone, lisinopril  BP Readings from Last 3 Encounters:  07/04/18 120/60  11/23/16 130/64  11/03/16 140/80  Using medication without problems or lightheadedness:  none Chest pain with exertion:none Edema:none Short of breath:none Average home BPs: at goal Other issues:  Elevated Cholesterol: LDL almost at goal > 70 on zetia, started on repatha. Statin intolerant Lab Results  Component Value Date   CHOL 149 06/28/2018   HDL 46.80 06/28/2018   LDLCALC 86 06/28/2018   TRIG 82.0 06/28/2018   CHOLHDL 3 06/28/2018  Using medications without problems:none Muscle aches: none Diet compliance:  Healthy heart diet. Exercise: moderate   Wt Readings from Last 3 Encounters:  07/04/18 269 lb (122 kg)  11/23/16 259 lb 4 oz (117.6 kg)  11/03/16 258 lb (117 kg)    Other complaints: CAD: Dr. Silvestre Moment  Seizure disorder: on longterm depakote..  No seizures    COVID 19 screen No recent travel or known exposure to [place] The patient denies respiratory symptoms of COVID 19 at this time.  The importance of social distancing was discussed today.   Review of Systems  Constitutional: Negative for chills and fever.  HENT: Negative for congestion and ear pain.   Eyes: Negative for pain and redness.  Respiratory: Negative for cough and shortness of breath.   Cardiovascular: Negative for chest pain, palpitations and leg swelling.   Gastrointestinal: Negative for abdominal pain, blood in stool, constipation, diarrhea, nausea and vomiting.  Genitourinary: Negative for dysuria.  Musculoskeletal: Negative for falls and myalgias.  Skin: Negative for rash.  Neurological: Negative for dizziness.  Psychiatric/Behavioral: Negative for depression. The patient is not nervous/anxious.       Past Medical History:  Diagnosis Date  . Arthritis   . CAD S/P percutaneous coronary angioplasty 1999   PCI- prox LAD @ D1, SP1 trifurcation.  3.0 mm x 16 mm AVE-GFX BMS  . History of nuclear stress test August 2012   Treadmill Myoview: 9 minutes, 10 METS --> hypertensive response.  No evidence of ischemia or infarction.  EF 64%  . HLD (hyperlipidemia)   . Hypertension   . Obesity (BMI 30-39.9)   . Seizure disorder ([place])   . Seizures ([place])    hx of years ago     reports that he has never smoked. He has never used smokeless tobacco. He reports that he does not drink alcohol or use drugs.   Current Outpatient Medications:  .  amLODipine (NORVASC) 5 MG tablet, Take 1 tablet (5 mg total) by mouth daily., Disp: 30 tablet, Rfl: 10 .  aspirin EC 81 MG tablet, Take by mouth., Disp: , Rfl:  .  DEPAKOTE 250 MG DR tablet, TAKE 1 TABLET BY MOUTH TWICE A DAY, Disp: 180 tablet, Rfl: 0 .  eplerenone (INSPRA) 25 MG tablet, Take 25 mg by mouth daily., Disp: , Rfl:  .  Evolocumab with Infusor 420 MG/3.5ML SOCT, Inject into the skin., Disp: , Rfl:  .  ezetimibe (ZETIA) 10 MG tablet, Take 10 mg by mouth daily., Disp: , Rfl:  .  Garlic 846 MG TABS, Take 500 mg by mouth daily., Disp: , Rfl:  .  lisinopril (PRINIVIL,ZESTRIL) 40 MG tablet, Take 1 tablet (40 mg total) by mouth daily. (Patient taking differently: Take 40 mg by mouth at bedtime. ), Disp: 30 tablet, Rfl: 10 .  Multiple Vitamin (MULTIVITAMIN) capsule, Take by mouth., Disp: , Rfl:  .  NITROSTAT 0.4 MG SL tablet, DISSOLVE 1 TABLET UNDER THE TONGUE FOR CHEST PAIN. MAY REPEAT EVERY 5MINUTES UP TO 3  DOSES. IF NO RELIEF, CALL 911**, Disp: 25 tablet, Rfl: 2 .  Red Yeast Rice Extract 600 MG CAPS, Take by mouth., Disp: , Rfl:    Observations/Objective: Blood pressure 120/60, pulse 64, temperature 98 F (36.7 C), temperature source Oral, height 5\' 9"  (1.753 m), weight 269 lb (122 kg).  Physical Exam Constitutional:      General: He is not in acute distress.    Appearance: Normal appearance. He is well-developed. He is obese. He is not ill-appearing or toxic-appearing.  HENT:     Head: Normocephalic and atraumatic.     Right Ear: Hearing, tympanic membrane, ear canal and external ear normal.     Left Ear: Hearing, tympanic membrane, ear canal and external ear normal.     Nose: Nose normal.     Mouth/Throat:     Pharynx: Uvula midline.  Eyes:     General: Lids are normal. Lids are everted, no foreign bodies appreciated.     Conjunctiva/sclera: Conjunctivae normal.     Pupils: Pupils are equal, round, and reactive to light.  Neck:     Musculoskeletal: Normal range of motion and neck supple.     Thyroid: No thyroid mass or thyromegaly.     Vascular: No carotid bruit.     Trachea: Trachea and phonation normal.  Cardiovascular:     Rate and Rhythm: Normal rate and regular rhythm.     Pulses: Normal pulses.     Heart sounds: S1 normal and S2 normal. No murmur. No gallop.   Pulmonary:     Breath sounds: Normal breath sounds. No wheezing, rhonchi or rales.  Abdominal:     General: Bowel sounds are normal.     Palpations: Abdomen is soft.     Tenderness: There is no abdominal tenderness. There is no guarding or rebound.     Hernia: No hernia is present.  Lymphadenopathy:     Cervical: No cervical adenopathy.  Skin:    General: Skin is warm and dry.     Findings: No rash.  Neurological:     Mental Status: He is alert.     Cranial Nerves: No cranial nerve deficit.     Sensory: No sensory deficit.     Gait: Gait normal.     Deep Tendon Reflexes: Reflexes are normal and symmetric.   Psychiatric:        Speech: Speech normal.        Behavior: Behavior normal.        Judgment: Judgment normal.      Assessment and Plan   The patient's preventative maintenance and recommended screening tests for an annual wellness exam were reviewed in full today. Brought up to date unless services declined.  Counselled on the importance of diet, exercise, and its role in overall health and mortality. The patient's FH and SH was reviewed, including their home life, tobacco status, and drug and alcohol status.  Stable PSA in past.. not indicated after age 62  At due 0.86 2019 Lab Results  Component Value Date   PSA 0.43 11/03/2016   PSA 0.32 10/29/2015   PSA 0.80 10/30/2014  Colon: 11/2015 tubular adenoma, repeat 5 years Vaccines:up to date prevnar, pneumovax and Tdap, consider shingles.  Nonsmoker. HEP C: neg  Essential hypertension Well controlled. Continue current medication.   Hyperlipidemia with target LDL less than 70 Almost at goal, on red yeast rice and zetia. Statin intolerant.  Low testosterone in male Minimally symptomatic, not interested in uro referral for testosterone treatment at this time.  Seizure disorder ([place]) No seizures or SE on current depakote dose.  CKD (chronic kidney disease) stage 3, GFR 30-59 ml/min Increase water intake.  Anemia of chronic disease Stable. Nml iron levels  Hyperkalemia Likely due to diet, multivitamin, CKD and lisinopril.  Info on low potassium diet, given. Increase water.  If not improving add HCTZ.      Eliezer Lofts, MD

## 2018-07-04 NOTE — Patient Instructions (Addendum)
Stop multivitamin or get one without potassium.  Stop high potassium foods Return in 2 weeks for repeat potassium check.  Potassium Content of Foods  Potassium is a mineral found in many foods and drinks. It affects how the heart works, and helps keep fluids and minerals balanced in the body. The amount of potassium you need each day depends on your age and any medical conditions you may have. Talk to your health care provider or dietitian about how much potassium you need. The following lists of foods provide the general serving size for foods and the approximate amount of potassium in each serving, listed in milligrams (mg). Actual values may vary depending on the product and how it is processed. High in potassium The following foods and beverages have 200 mg or more of potassium per serving:  Apricots (raw) - 2 have 200 mg of potassium.  Apricots (dry) - 5 have 200 mg of potassium.  Artichoke - 1 medium has 345 mg of potassium.  Avocado -  fruit has 245 mg of potassium.  Banana - 1 medium fruit has 425 mg of potassium.  [place] or baked beans (canned) -  cup has 280 mg of potassium.  White beans (canned) -  cup has 595 mg potassium.  Beef roast - 3 oz has 320 mg of potassium.  Ground beef - 3 oz has 270 mg of potassium.  Beets (raw or cooked) -  cup has 260 mg of potassium.  Bran muffin - 2 oz has 300 mg of potassium.  Broccoli (cooked) -  cup has 230 mg of potassium.  Brussels sprouts -  cup has 250 mg of potassium.  Cantaloupe -  cup has 215 mg of potassium.  Cereal, 100% bran -  cup has 200-400 mg of potassium.  Cheeseburger -1 single fast food burger has 225-400 mg of potassium.  Chicken - 3 oz has 220 mg of potassium.  Clams (canned) - 3 oz has 535 mg of potassium.  Crab - 3 oz has 225 mg of potassium.  Dates - 5 have 270 mg of potassium.  Dried beans and peas -  cup has 300-475 mg of potassium.  Figs (dried) - 2 have 260 mg of potassium.  Fish  (halibut, tuna, cod, snapper) - 3 oz has 480 mg of potassium.  Fish (salmon, haddock, swordfish, perch) - 3 oz has 300 mg of potassium.  Fish (tuna, canned) - 3 oz has 200 mg of potassium.  [place] fries (fast food) - 3 oz has 470 mg of potassium.  Granola with fruit and nuts -  cup has 200 mg of potassium.  Grapefruit juice -  cup has 200 mg of potassium.  Honeydew melon -  cup has 200 mg of potassium.  Kale (raw) - 1 cup has 300 mg of potassium.  Kiwi - 1 medium fruit has 240 mg of potassium.  Kohlrabi, rutabaga, parsnips -  cup has 280 mg of potassium.  Lentils -  cup has 365 mg of potassium.  Mango - 1 each has 325 mg of potassium.  Milk (nonfat, low-fat, whole, buttermilk) - 1 cup has 350-380 mg of potassium.  Milk (chocolate) - 1 cup has 420 mg of potassium  Molasses - 1 Tbsp has 295 mg of potassium.  Mushrooms -  cup has 280 mg of potassium.  Nectarine - 1 each has 275 mg of potassium.  Nuts (almonds, peanuts, hazelnuts, [place], cashew, mixed) - 1 oz has 200 mg of potassium.  Nuts (pistachios) -  1 oz has 295 mg of potassium.  Orange - 1 fruit has 240 mg of potassium.  Orange juice -  cup has 235 mg of potassium.  Papaya -  medium fruit has 390 mg of potassium.  Peanut butter (chunky) - 2 Tbsp has 240 mg of potassium.  Peanut butter (smooth) - 2 Tbsp has 210 mg of potassium.  Pear - 1 medium (200 mg) of potassium.  Pomegranate - 1 whole fruit has 400 mg of potassium.  Pomegranate juice -  cup has 215 mg of potassium.  Pork - 3 oz has 350 mg of potassium.  Potato chips (salted) - 1 oz has 465 mg of potassium.  Potato (baked with skin) - 1 medium has 925 mg of potassium.  Potato (boiled) -  cup has 255 mg of potassium.  Potato (Mashed) -  cup has 330 mg of potassium.  Prune juice -  cup has 370 mg of potassium.  Prunes - 5 have 305 mg of potassium.  Pudding (chocolate) -  cup has 230 mg of potassium.  Pumpkin (canned) -  cup has  250 mg of potassium.  Raisins (seedless) -  cup has 270 mg of potassium.  Seeds (sunflower or pumpkin) - 1 oz has 240 mg of potassium.  Soy milk - 1 cup has 300 mg of potassium.  Spinach (cooked) - 1/2 cup has 420 mg of potassium.  Spinach (canned) -  cup has 370 mg of potassium.  Sweet potato (baked with skin) - 1 medium has 450 mg of potassium.  Swiss chard -  cup has 480 mg of potassium.  Tomato or vegetable juice -  cup has 275 mg of potassium.  Tomato (sauce or puree) -  cup has 400-550 mg of potassium.  Tomato (raw) - 1 medium has 290 mg of potassium.  Tomato (canned) -  cup has 200-300 mg of potassium.  [place] - 3 oz has 250 mg of potassium.  Wheat germ - 1 oz has 250 mg of potassium.  Winter squash -  cup has 250 mg of potassium.  Yogurt (plain or fruited) - 6 oz has 260-435 mg of potassium.  Zucchini -  cup has 220 mg of potassium. Moderate in potassium The following foods and beverages have 50-200 mg of potassium per serving:  Apple - 1 fruit has 150 mg of potassium  Apple juice -  cup has 150 mg of potassium  Applesauce -  cup has 90 mg of potassium  Apricot nectar -  cup has 140 mg of potassium  Asparagus (small spears) -  cup has 155 mg of potassium  Asparagus (large spears) - 6 have 155 mg of potassium  Bagel (cinnamon raisin) - 1 four-inch bagel has 130 mg of potassium  Bagel (egg or plain) - 1 four- inch bagel has 70 mg of potassium  Beans (green) -  cup has 90 mg of potassium  Beans (yellow) -  cup has 190 mg of potassium  Beer, regular - 12 oz has 100 mg of potassium  Beets (canned) -  cup has 125 mg of potassium  Blackberries -  cup has 115 mg of potassium  Blueberries -  cup has 60 mg of potassium  Bread (whole wheat) - 1 slice has 70 mg of potassium  Broccoli (raw) -  cup has 145 mg of potassium  Cabbage -  cup has 150 mg of potassium  Carrots (cooked or raw) -  cup has 180 mg of potassium  Cauliflower  (  raw) -  cup has 150 mg of potassium  Celery (raw) -  cup has 155 mg of potassium  Cereal, bran flakes -  cup has 120-150 mg of potassium  Cheese (cottage) -  cup has 110 mg of potassium  Cherries - 10 have 150 mg of potassium  Chocolate - 1 oz bar has 165 mg of potassium  Coffee (brewed) - 6 oz has 90 mg of potassium  Corn -  cup or 1 ear has 195 mg of potassium  Cucumbers -  cup has 80 mg of potassium  Egg - 1 large egg has 60 mg of potassium  Eggplant -  cup has 60 mg of potassium  Endive (raw) -  cup has 80 mg of potassium  English muffin - 1 has 65 mg of potassium  Fish (ocean perch) - 3 oz has 192 mg of potassium  Frankfurter, beef or pork - 1 has 75 mg of potassium  Fruit cocktail -  cup has 115 mg of potassium  Grape juice -  cup has 170 mg of potassium  Grapefruit -  fruit has 175 mg of potassium  Grapes -  cup has 155 mg of potassium  Greens: kale, turnip, collard -  cup has 110-150 mg of potassium  Ice cream or frozen yogurt (chocolate) -  cup has 175 mg of potassium  Ice cream or frozen yogurt (vanilla) -  cup has 120-150 mg of potassium  Lemons, limes - 1 each has 80 mg of potassium  Lettuce - 1 cup has 100 mg of potassium  Mixed vegetables -  cup has 150 mg of potassium  Mushrooms, raw -  cup has 110 mg of potassium  Nuts (walnuts, pecans, or macadamia) - 1 oz has 125 mg of potassium  Oatmeal -  cup has 80 mg of potassium  Okra -  cup has 110 mg of potassium  Onions -  cup has 120 mg of potassium  Peach - 1 has 185 mg of potassium  Peaches (canned) -  cup has 120 mg of potassium  Pears (canned) -  cup has 120 mg of potassium  Peas, green (frozen) -  cup has 90 mg of potassium  Peppers (Green) -  cup has 130 mg of potassium  Peppers (Red) -  cup has 160 mg of potassium  Pineapple juice -  cup has 165 mg of potassium  Pineapple (fresh or canned) -  cup has 100 mg of potassium  Plums - 1 has 105 mg of  potassium  Pudding, vanilla -  cup has 150 mg of potassium  Raspberries -  cup has 90 mg of potassium  Rhubarb -  cup has 115 mg of potassium  Rice, wild -  cup has 80 mg of potassium  Shrimp - 3 oz has 155 mg of potassium  Spinach (raw) - 1 cup has 170 mg of potassium  Strawberries -  cup has 125 mg of potassium  Summer squash -  cup has 175-200 mg of potassium  Swiss chard (raw) - 1 cup has 135 mg of potassium  Tangerines - 1 fruit has 140 mg of potassium  Tea, brewed - 6 oz has 65 mg of potassium  Turnips -  cup has 140 mg of potassium  Watermelon -  cup has 85 mg of potassium  Wine (Red, table) - 5 oz has 180 mg of potassium  Wine (White, table) - 5 oz 100 mg of potassium Low in potassium The following foods  and beverages have less than 50 mg of potassium per serving.  Bread (white) - 1 slice has 30 mg of potassium  Carbonated beverages - 12 oz has less than 5 mg of potassium  Cheese - 1 oz has 20-30 mg of potassium  Cranberries -  cup has 45 mg of potassium  Cranberry juice cocktail -  cup has 20 mg of potassium  Fats and oils - 1 Tbsp has less than 5 mg of potassium  Hummus - 1 Tbsp has 32 mg of potassium  Nectar (papaya, mango, or pear) -  cup has 35 mg of potassium  Rice (white or brown) -  cup has 50 mg of potassium  Spaghetti or macaroni (cooked) -  cup has 30 mg of potassium  T[place]illa, flour or corn - 1 has 50 mg of potassium  Waffle - 1 four-inch waffle has 50 mg of potassium  Water chestnuts -  cup has 40 mg of potassium Summary  Potassium is a mineral found in many foods and drinks. It affects how the heart works, and helps keep fluids and minerals balanced in the body.  The amount of potassium you need each day depends on your age and any existing medical conditions you may have. Your health care provider or dietitian may recommend an amount of potassium that you should have each day. This information is not intended to  replace advice given to you by your health care provider. Make sure you discuss any questions you have with your health care provider. Document Released: 08/25/2004 Document Revised: 04/07/2016 Document Reviewed: 04/07/2016 Elsevier Interactive Patient Education  2019 [employer].

## 2018-07-04 NOTE — Assessment & Plan Note (Signed)
Likely due to diet, multivitamin, CKD and lisinopril.  Info on low potassium diet, given. Increase water.  If not improving add HCTZ.

## 2018-07-04 NOTE — Assessment & Plan Note (Signed)
No seizures or SE on current depakote dose. 

## 2018-08-22 ENCOUNTER — Other Ambulatory Visit: Payer: Self-pay

## 2018-08-22 ENCOUNTER — Encounter (HOSPITAL_COMMUNITY): Payer: Self-pay | Admitting: Family Medicine

## 2018-08-22 ENCOUNTER — Inpatient Hospital Stay (HOSPITAL_COMMUNITY)
Admission: EM | Admit: 2018-08-22 | Discharge: 2018-08-26 | DRG: 481 | Disposition: A | Discharge status: Home Health | Payer: Medicare HMO | Attending: Internal Medicine | Admitting: Internal Medicine

## 2018-08-22 ENCOUNTER — Inpatient Hospital Stay (HOSPITAL_COMMUNITY): Payer: Medicare HMO

## 2018-08-22 ENCOUNTER — Emergency Department (HOSPITAL_COMMUNITY): Payer: Medicare HMO

## 2018-08-22 DIAGNOSIS — Z8249 Family history of ischemic heart disease and other diseases of the circulatory system: Secondary | ICD-10-CM | POA: Diagnosis not present

## 2018-08-22 DIAGNOSIS — Z6838 Body mass index (BMI) 38.0-38.9, adult: Secondary | ICD-10-CM | POA: Diagnosis not present

## 2018-08-22 DIAGNOSIS — Z419 Encounter for procedure for purposes other than remedying health state, unspecified: Secondary | ICD-10-CM

## 2018-08-22 DIAGNOSIS — Y92008 Other place in unspecified non-institutional (private) residence as the place of occurrence of the external cause: Secondary | ICD-10-CM | POA: Diagnosis not present

## 2018-08-22 DIAGNOSIS — Z03818 Encounter for observation for suspected exposure to other biological agents ruled out: Secondary | ICD-10-CM | POA: Diagnosis not present

## 2018-08-22 DIAGNOSIS — E875 Hyperkalemia: Secondary | ICD-10-CM | POA: Diagnosis not present

## 2018-08-22 DIAGNOSIS — Z7982 Long term (current) use of aspirin: Secondary | ICD-10-CM

## 2018-08-22 DIAGNOSIS — I129 Hypertensive chronic kidney disease with stage 1 through stage 4 chronic kidney disease, or unspecified chronic kidney disease: Secondary | ICD-10-CM | POA: Diagnosis present

## 2018-08-22 DIAGNOSIS — I2510 Atherosclerotic heart disease of native coronary artery without angina pectoris: Secondary | ICD-10-CM | POA: Diagnosis not present

## 2018-08-22 DIAGNOSIS — S72342A Displaced spiral fracture of shaft of left femur, initial encounter for closed fracture: Secondary | ICD-10-CM | POA: Diagnosis not present

## 2018-08-22 DIAGNOSIS — W010XXA Fall on same level from slipping, tripping and stumbling without subsequent striking against object, initial encounter: Secondary | ICD-10-CM | POA: Diagnosis present

## 2018-08-22 DIAGNOSIS — E669 Obesity, unspecified: Secondary | ICD-10-CM | POA: Diagnosis present

## 2018-08-22 DIAGNOSIS — S72332A Displaced oblique fracture of shaft of left femur, initial encounter for closed fracture: Secondary | ICD-10-CM

## 2018-08-22 DIAGNOSIS — Z96652 Presence of left artificial knee joint: Secondary | ICD-10-CM | POA: Diagnosis not present

## 2018-08-22 DIAGNOSIS — E785 Hyperlipidemia, unspecified: Secondary | ICD-10-CM | POA: Diagnosis present

## 2018-08-22 DIAGNOSIS — S72302D Unspecified fracture of shaft of left femur, subsequent encounter for closed fracture with routine healing: Secondary | ICD-10-CM | POA: Diagnosis not present

## 2018-08-22 DIAGNOSIS — S72402A Unspecified fracture of lower end of left femur, initial encounter for closed fracture: Secondary | ICD-10-CM | POA: Diagnosis not present

## 2018-08-22 DIAGNOSIS — S7292XD Unspecified fracture of left femur, subsequent encounter for closed fracture with routine healing: Secondary | ICD-10-CM | POA: Diagnosis not present

## 2018-08-22 DIAGNOSIS — Z955 Presence of coronary angioplasty implant and graft: Secondary | ICD-10-CM | POA: Diagnosis not present

## 2018-08-22 DIAGNOSIS — I10 Essential (primary) hypertension: Secondary | ICD-10-CM | POA: Diagnosis not present

## 2018-08-22 DIAGNOSIS — D62 Acute posthemorrhagic anemia: Secondary | ICD-10-CM | POA: Diagnosis not present

## 2018-08-22 DIAGNOSIS — M9712XA Periprosthetic fracture around internal prosthetic left knee joint, initial encounter: Principal | ICD-10-CM | POA: Diagnosis present

## 2018-08-22 DIAGNOSIS — N179 Acute kidney failure, unspecified: Secondary | ICD-10-CM | POA: Diagnosis not present

## 2018-08-22 DIAGNOSIS — S7292XA Unspecified fracture of left femur, initial encounter for closed fracture: Secondary | ICD-10-CM | POA: Diagnosis not present

## 2018-08-22 DIAGNOSIS — N182 Chronic kidney disease, stage 2 (mild): Secondary | ICD-10-CM | POA: Diagnosis present

## 2018-08-22 DIAGNOSIS — M9702XA Periprosthetic fracture around internal prosthetic left hip joint, initial encounter: Secondary | ICD-10-CM | POA: Diagnosis not present

## 2018-08-22 DIAGNOSIS — R52 Pain, unspecified: Secondary | ICD-10-CM | POA: Diagnosis not present

## 2018-08-22 DIAGNOSIS — D631 Anemia in chronic kidney disease: Secondary | ICD-10-CM | POA: Diagnosis present

## 2018-08-22 DIAGNOSIS — N183 Chronic kidney disease, stage 3 (moderate): Secondary | ICD-10-CM | POA: Diagnosis not present

## 2018-08-22 DIAGNOSIS — Z20828 Contact with and (suspected) exposure to other viral communicable diseases: Secondary | ICD-10-CM | POA: Diagnosis not present

## 2018-08-22 DIAGNOSIS — Z888 Allergy status to other drugs, medicaments and biological substances status: Secondary | ICD-10-CM

## 2018-08-22 DIAGNOSIS — Z79899 Other long term (current) drug therapy: Secondary | ICD-10-CM | POA: Diagnosis not present

## 2018-08-22 DIAGNOSIS — Z01818 Encounter for other preprocedural examination: Secondary | ICD-10-CM | POA: Diagnosis not present

## 2018-08-22 DIAGNOSIS — G40909 Epilepsy, unspecified, not intractable, without status epilepticus: Secondary | ICD-10-CM | POA: Diagnosis present

## 2018-08-22 DIAGNOSIS — D638 Anemia in other chronic diseases classified elsewhere: Secondary | ICD-10-CM | POA: Diagnosis not present

## 2018-08-22 DIAGNOSIS — S72302A Unspecified fracture of shaft of left femur, initial encounter for closed fracture: Secondary | ICD-10-CM

## 2018-08-22 DIAGNOSIS — W19XXXA Unspecified fall, initial encounter: Secondary | ICD-10-CM | POA: Diagnosis not present

## 2018-08-22 DIAGNOSIS — Z9861 Coronary angioplasty status: Secondary | ICD-10-CM

## 2018-08-22 DIAGNOSIS — M6282 Rhabdomyolysis: Secondary | ICD-10-CM | POA: Diagnosis present

## 2018-08-22 DIAGNOSIS — R609 Edema, unspecified: Secondary | ICD-10-CM | POA: Diagnosis not present

## 2018-08-22 LAB — CBC WITH DIFFERENTIAL/PLATELET
Abs Immature Granulocytes: 0.06 10*3/uL (ref 0.00–0.07)
Basophils Absolute: 0 10*3/uL (ref 0.0–0.1)
Basophils Relative: 0 %
Eosinophils Absolute: 0 10*3/uL (ref 0.0–0.5)
Eosinophils Relative: 0 %
HCT: 34.2 % — ABNORMAL LOW (ref 39.0–52.0)
Hemoglobin: 10.9 g/dL — ABNORMAL LOW (ref 13.0–17.0)
Immature Granulocytes: 1 %
Lymphocytes Relative: 11 %
Lymphs Abs: 1.2 10*3/uL (ref 0.7–4.0)
MCH: 30.5 pg (ref 26.0–34.0)
MCHC: 31.9 g/dL (ref 30.0–36.0)
MCV: 95.8 fL (ref 80.0–100.0)
Monocytes Absolute: 0.8 10*3/uL (ref 0.1–1.0)
Monocytes Relative: 7 %
Neutro Abs: 9.2 10*3/uL — ABNORMAL HIGH (ref 1.7–7.7)
Neutrophils Relative %: 81 %
Platelets: 184 10*3/uL (ref 150–400)
RBC: 3.57 MIL/uL — ABNORMAL LOW (ref 4.22–5.81)
RDW: 13 % (ref 11.5–15.5)
WBC: 11.3 10*3/uL — ABNORMAL HIGH (ref 4.0–10.5)
nRBC: 0 % (ref 0.0–0.2)

## 2018-08-22 LAB — COMPREHENSIVE METABOLIC PANEL
ALT: 19 U/L (ref 0–44)
AST: 21 U/L (ref 15–41)
Albumin: 3.6 g/dL (ref 3.5–5.0)
Alkaline Phosphatase: 62 U/L (ref 38–126)
Anion gap: 9 (ref 5–15)
BUN: 35 mg/dL — ABNORMAL HIGH (ref 8–23)
CO2: 18 mmol/L — ABNORMAL LOW (ref 22–32)
Calcium: 8.8 mg/dL — ABNORMAL LOW (ref 8.9–10.3)
Chloride: 110 mmol/L (ref 98–111)
Creatinine, Ser: 1.25 mg/dL — ABNORMAL HIGH (ref 0.61–1.24)
GFR calc Af Amer: >60 mL/min (ref >60)
GFR calc non Af Amer: 58 mL/min — ABNORMAL LOW (ref >60)
Glucose, Bld: 138 mg/dL — ABNORMAL HIGH (ref 70–99)
Potassium: 6 mmol/L — ABNORMAL HIGH (ref 3.5–5.1)
Sodium: 137 mmol/L (ref 135–145)
Total Bilirubin: 0.4 mg/dL (ref 0.3–1.2)
Total Protein: 6.2 g/dL — ABNORMAL LOW (ref 6.5–8.1)

## 2018-08-22 LAB — SARS CORONAVIRUS 2 BY RT PCR (HOSPITAL ORDER, PERFORMED IN ~~LOC~~ HOSPITAL LAB): SARS Coronavirus 2: NEGATIVE

## 2018-08-22 MED ORDER — FUROSEMIDE 10 MG/ML IJ SOLN
40.0000 mg | Freq: Once | INTRAMUSCULAR | Status: AC
  Administered 2018-08-23: 40 mg via INTRAVENOUS
  Filled 2018-08-22: qty 4

## 2018-08-22 MED ORDER — HYDROMORPHONE HCL 1 MG/ML IJ SOLN
1.0000 mg | Freq: Once | INTRAMUSCULAR | Status: AC
  Administered 2018-08-22: 1 mg via INTRAVENOUS
  Filled 2018-08-22: qty 1

## 2018-08-22 MED ORDER — SODIUM CHLORIDE 0.9 % IV BOLUS
500.0000 mL | Freq: Once | INTRAVENOUS | Status: AC
  Administered 2018-08-23: 500 mL via INTRAVENOUS

## 2018-08-22 MED ORDER — HYDROMORPHONE HCL 1 MG/ML IJ SOLN
1.0000 mg | Freq: Once | INTRAMUSCULAR | Status: AC
  Administered 2018-08-22: 22:00:00 1 mg via INTRAVENOUS
  Filled 2018-08-22: qty 1

## 2018-08-22 NOTE — ED Triage Notes (Signed)
Patient is from home and transported via [facility]. Patient was outside in his garage when he tripped and fell over a tool. Patient landed on his right side, but twisted his left knee. Patient reported to EMS, he laid on the ground for about 1.5 hours before his spouse found him. Patients left leg is splinted by EMS.

## 2018-08-22 NOTE — H&P (Signed)
History and Physical    Joshua Stark:[id number] DOB: [birth year] DOA: 08/22/2018  PCP: Jinny Sanders, MD   Patient coming from: Home   Chief Complaint: Fall, left thigh and knee pain   HPI: Joshua Stark is a 73 y.o. male with medical history significant for coronary artery disease, hypertension, seizure disorder, and chronic kidney disease stage III, now presenting to the emergency department with left knee pain after a fall at home.  Patient reports he been in his usual state of health and was having an uneventful day when he was working on something in his garage and tripped over a tool.  He reports feeling as though he twisted the left knee, fell to the ground, did not hit his head or lose consciousness, but was experiencing severe pain involving the left thigh and knee and was unable to get up.  Patient denies any recent fevers, chills, cough, or shortness of breath.  He denies any angina symptoms, reports that he never uses his nitroglycerin, and is able to ascend a flight of stairs without any chest pain or significant dyspnea.    ED Course: Upon arrival to the ED, patient is found to be afebrile, saturating well on room air, and with remaining vitals also normal.  Chemistry panel is notable for potassium of 6.0, bicarbonate 18, BUN 35, and creatinine 1.25, similar to priors.  CBC features a mild leukocytosis and a normocytic anemia with hemoglobin 10.9.  Radiographs of the left femur demonstrate comminuted and obliquely oriented fracture through the mid to distal left femur with left TKA appearing to be intact.  Patient was treated with Dilaudid in the emergency department.  Orthopedic surgery was consulted by the ED physician and recommended medical admission.  Review of Systems:  All other systems reviewed and apart from HPI, are negative.  Past Medical History:  Diagnosis Date  . Arthritis   . CAD S/P percutaneous coronary angioplasty 1999   PCI- prox LAD @ D1, SP1  trifurcation.  3.0 mm x 16 mm AVE-GFX BMS  . History of nuclear stress test August 2012   Treadmill Myoview: 9 minutes, 10 METS --> hypertensive response.  No evidence of ischemia or infarction.  EF 64%  . HLD (hyperlipidemia)   . Hypertension   . Obesity (BMI 30-39.9)   . Seizure disorder ([place])   . Seizures ([place])    hx of years ago     Past Surgical History:  Procedure Laterality Date  . CARDIAC CATHETERIZATION  01/20/1998  . CORONARY ANGIOPLASTY  01/20/1998   PCI to LAD ,prox LAD at trifurcation for D1and SP1.3.0-x16-mm AVE-GFX bare-metal stent  . NM MYOCAR PERF WALL MOTION  Aug 2 ,2012   treadmill myoview exercised 9 minutes, reacing 10 metaboblic equivlents,had ahypertensive reponse .EF64%  . TOTAL KNEE ARTHROPLASTY Left 08/17/2016   Procedure: LEFT TOTAL KNEE ARTHROPLASTY;  Surgeon: Paralee Cancel, MD;  Location: [facility];  Service: Orthopedics;  Laterality: Left;  . TRANSTHORACIC ECHOCARDIOGRAM  09/20/2013   normal LV size and function. EF 60-65%. Normal diastolic function. Mild LA dilation and mild AI.  No evidence of aortic stenosis. No significant MR.     reports that he has never smoked. He has never used smokeless tobacco. He reports that he does not drink alcohol or use drugs.  Allergies  Allergen Reactions  . Fenofibrate Other (See Comments)    Arthritis, cramping   . Rosuvastatin Other (See Comments)    REACTION: cramps   . Pravastatin Sodium Other (See  Comments)    REACTION: cramps  . Statins Other (See Comments)    Cramping     Family History  Problem Relation Age of Onset  . Hypertension Mother   . Cancer Father        lung and prostate     Prior to Admission medications   Medication Sig Start Date End Date Taking? Authorizing Provider  amLODipine (NORVASC) 5 MG tablet Take 1 tablet (5 mg total) by mouth daily. 03/30/16  Yes Leonie Man, MD  aspirin EC 81 MG tablet Take 81 mg by mouth daily.    Yes [provider]  DEPAKOTE 250 MG DR tablet  TAKE 1 TABLET BY MOUTH TWICE A DAY Patient taking differently: Take 250 mg by mouth 2 (two) times daily.  05/30/18  Yes Bedsole, Amy E, MD  eplerenone (INSPRA) 25 MG tablet Take 25 mg by mouth daily.   Yes [provider]  Evolocumab with Infusor 420 MG/3.5ML SOCT Inject 3.5 mLs into the skin every 30 (thirty) days.  05/17/18  Yes [provider]  Garlic 629 MG TABS Take 500 mg by mouth daily.   Yes [provider]  lisinopril (PRINIVIL,ZESTRIL) 40 MG tablet Take 1 tablet (40 mg total) by mouth daily. Patient taking differently: Take 20 mg by mouth 2 (two) times a day.  03/30/16  Yes Leonie Man, MD  Multiple Vitamin (MULTIVITAMIN) capsule Take 1 capsule by mouth daily.    Yes [provider]  NITROSTAT 0.4 MG SL tablet DISSOLVE 1 TABLET UNDER THE TONGUE FOR CHEST PAIN. MAY REPEAT EVERY 5MINUTES UP TO 3 DOSES. IF NO RELIEF, CALL 911** Patient taking differently: Place 0.4 mg under the tongue every 5 (five) minutes as needed for chest pain.  06/26/15   Leonie Man, MD    Physical Exam: Vitals:   08/22/18 2007 08/22/18 2032 08/22/18 2034 08/22/18 2211  BP:  (!) 141/77  128/73  Pulse:  98  62  Resp:  18  16  Temp:  98.2 F (36.8 C)    TempSrc:  Oral    SpO2: 96% 98%  98%  Weight:   122.5 kg   Height:   5\' 11"  (1.803 m)     Constitutional: NAD, calm  Eyes: PERTLA, lids and conjunctivae normal ENMT: Mucous membranes are moist. Posterior pharynx clear of any exudate or lesions.   Neck: normal, supple, no masses, no thyromegaly Respiratory: no wheezing, no crackles. Normal respiratory effort. No accessory muscle use.  Cardiovascular: S1 & S2 heard, regular rate and rhythm. No extremity edema.   Abdomen: No distension, no tenderness, soft. Bowel sounds normal.  Musculoskeletal: no clubbing / cyanosis. Left thigh tenderness and deformity, neurovascularly intact distally.    Skin: no significant rashes, lesions, ulcers. Warm, dry, well-perfused.  Neurologic: CN 2-12 grossly intact. Sensation intact. Strength 5/5 in all 4 limbs.  Psychiatric:  Alert and oriented x 3. Pleasant, cooperative.    Labs on Admission: I have personally reviewed following labs and imaging studies  CBC: Recent Labs  Lab 08/22/18 2153  WBC 11.3*  NEUTROABS 9.2*  HGB 10.9*  HCT 34.2*  MCV 95.8  PLT 528   Basic Metabolic Panel: Recent Labs  Lab 08/22/18 2153  NA 137  K 6.0*  CL 110  CO2 18*  GLUCOSE 138*  BUN 35*  CREATININE 1.25*  CALCIUM 8.8*   GFR: Estimated Creatinine Clearance: 72.2 mL/min (A) (by C-G formula based on SCr of 1.25 mg/dL (H)). Liver Function Tests:  Recent Labs  Lab 08/22/18 2153  AST 21  ALT 19  ALKPHOS 62  BILITOT 0.4  PROT 6.2*  ALBUMIN 3.6   No results for input(s): LIPASE, AMYLASE in the last 168 hours. No results for input(s): AMMONIA in the last 168 hours. Coagulation Profile: No results for input(s): INR, PROTIME in the last 168 hours. Cardiac Enzymes: No results for input(s): CKTOTAL, CKMB, CKMBINDEX, TROPONINI in the last 168 hours. BNP (last 3 results) No results for input(s): PROBNP in the last 8760 hours. HbA1C: No results for input(s): HGBA1C in the last 72 hours. CBG: No results for input(s): GLUCAP in the last 168 hours. Lipid Profile: No results for input(s): CHOL, HDL, LDLCALC, TRIG, CHOLHDL, LDLDIRECT in the last 72 hours. Thyroid Function Tests: No results for input(s): TSH, T4TOTAL, FREET4, T3FREE, THYROIDAB in the last 72 hours. Anemia Panel: No results for input(s): VITAMINB12, FOLATE, FERRITIN, TIBC, IRON, RETICCTPCT in the last 72 hours. Urine analysis:    Component Value Date/Time   COLORURINE yellow 07/07/2009 1144   APPEARANCEUR Clear 07/07/2009 1144   LABSPEC >=1.030 07/07/2009 1144   PHURINE 6.0 07/07/2009 1144   HGBUR negative 07/07/2009 1144   BILIRUBINUR negative 07/07/2009 1144   UROBILINOGEN 0.2 07/07/2009 1144   NITRITE negative 07/07/2009 1144   Sepsis Labs:  @LABRCNTIP (procalcitonin:4,lacticidven:4) )No results found for this or any previous visit (from the past 240 hour(s)).   Radiological Exams on Admission: Dg Femur Min 2 Views Left  Result Date: 08/22/2018 CLINICAL DATA:  Patient tripped and fell EXAM: LEFT FEMUR 2 VIEWS COMPARISON:  None. FINDINGS: There is obliquely oriented comminuted fracture of the midshaft of the femur with medial angulation and overlap of fracture fragments. The fracture line extends to the distal femur, however the total knee arthroplasty appears to be intact. There is overlying soft tissue swelling. IMPRESSION: Comminuted obliquely oriented fracture seen through the mid to distal femur. Left total knee arthroplasty which appears to be intact. Electronically Signed   By: Prudencio Pair M.D.   On: 08/22/2018 22:42    EKG: Ordered, not yet performed.   Assessment/Plan   1. Left femur fracture   - Presents with left knee pain after a mechanical fall at home and is found to have mid-distal left femur fracture  - Orthopedic surgery is consulting and much appreciated  - EKG pending; barring abnormal EKG or failure of potassium level to normalize by am, Mr. Regis will present an estimated 0.7% risk of perioperative MI or cardiac arrest  - Immobilize left leg in splint, keep NPO after midnight, hold pharmacologic VTE ppx, continue pain-control and supportive care, type and screen, repeat CBC and chem panel in am    2. Hyperkalemia; CKD stage III  - Serum potassium is 6.0 on admission  - SCr is 1.25, similar to priors  - STAT EKG, continue cardiac monitoring, treat with 500 cc NS and Lasix, follow serial potassium levels, hold lisinopril and eplerenone    3. Hypertension  - BP at goal  - Hold lisinopril and eplerenone in light of hyperkalemia, has hx of severe bradycardia, will use hydralazine IVP's as needed    4. CAD  - Denies angina, never uses his NTG, able to ascend a flight of stairs without symptoms  - Follows  with [facility], had stent >20 yrs ago, normal perfusion and EF 50% on stress test from 2018  - Hold ASA and ACE-i for orthopedic surgery   5. Anemia  - Hgb is 10.9 on admission, down from  12.5 in June  - No apparent bleeding  - Type and screen, repeat CBC in am    6. Seizure disorder  - Continue Depakote     PPE: Mask, face shield  DVT prophylaxis: SCD's  Code Status: Full  Family Communication: Discussed with patient  Consults called: Orthopedic surgery  Admission status: Inpatient     Vianne Bulls, MD [facility] Pager [phone]  If 7PM-7AM, please contact night-coverage [email or web address] Password [facility]  08/22/2018, 11:23 PM

## 2018-08-22 NOTE — Progress Notes (Signed)
Consult received. Patient known to Dr. Alvan Dame for L TKA. Now with ppx L femur fracture. Will require surgical stabilization.  Recommend immobilization in LL splint. NWB LLE. NPO after MN. Hold chemical DVT ppx. Will see patient tomorrow.

## 2018-08-22 NOTE — ED Notes (Signed)
Patient transported to X-ray 

## 2018-08-22 NOTE — ED Notes (Signed)
Gave report to [place], RN for room [phone]. Hospitalist has assessed patient.

## 2018-08-22 NOTE — ED Notes (Signed)
Called  Ortho Tech for long leg splint.

## 2018-08-22 NOTE — ED Provider Notes (Signed)
[place] DEPT Provider Note   CSN: [id number] Arrival date & time: 08/22/18  1934    History   Chief Complaint Chief Complaint  Patient presents with  . Fall    HPI Joshua Stark is a 73 y.o. male.     HPI  73yo male with history of CAD, htn, hlpd, seizure disorder, total left knee replacement, presents with concern for fall with left thigh and knee pain.  Reports he was in normal state of health and was working in the garage when he tripped over a creeper and felt his knee twist and had immediate pain to his leg and knee. Was unable to stand.  No head trauma or neck trauma, no headache, neck pain, chest pain, abdominal pain.  Has tingling to the top of his foot, medial portion of foot but no other numbness or weakness.    Past Medical History:  Diagnosis Date  . Arthritis   . CAD S/P percutaneous coronary angioplasty 1999   PCI- prox LAD @ D1, SP1 trifurcation.  3.0 mm x 16 mm AVE-GFX BMS  . History of nuclear stress test August 2012   Treadmill Myoview: 9 minutes, 10 METS --> hypertensive response.  No evidence of ischemia or infarction.  EF 64%  . HLD (hyperlipidemia)   . Hypertension   . Obesity (BMI 30-39.9)   . Seizure disorder ([place])   . Seizures ([place])    hx of years ago     Patient Active Problem List   Diagnosis Date Noted  . Femur fracture, left ([place]) 08/22/2018  . S/P left TKA 08/17/2016  . S/P total knee replacement 08/17/2016  . Bradycardia 03/19/2014  . Aortic ejection murmur 09/18/2013  . CKD (chronic kidney disease) stage 3, GFR 30-59 ml/min ([facility]) 05/01/2013  . CAD S/P percutaneous coronary angioplasty   . Essential hypertension   . Hyperlipidemia with target LDL less than 70   . Class 1 obesity with serious comorbidity and body mass index (BMI) of 30.0 to 30.9 in adult   . Hyperkalemia 07/27/2011  . Low testosterone in male 07/30/2009  . Anemia of chronic disease 07/29/2009  . CONSTIPATION 07/07/2009  . ABDOMINAL  PAIN, LEFT LOWER QUADRANT 07/07/2009  . Seizure disorder ([place]) 10/13/2006    Past Surgical History:  Procedure Laterality Date  . CARDIAC CATHETERIZATION  01/20/1998  . CORONARY ANGIOPLASTY  01/20/1998   PCI to LAD ,prox LAD at trifurcation for D1and SP1.3.0-x16-mm AVE-GFX bare-metal stent  . NM MYOCAR PERF WALL MOTION  Aug 2 ,2012   treadmill myoview exercised 9 minutes, reacing 10 metaboblic equivlents,had ahypertensive reponse .EF64%  . TOTAL KNEE ARTHROPLASTY Left 08/17/2016   Procedure: LEFT TOTAL KNEE ARTHROPLASTY;  Surgeon: Paralee Cancel, MD;  Location: [facility];  Service: Orthopedics;  Laterality: Left;  . TRANSTHORACIC ECHOCARDIOGRAM  09/20/2013   normal LV size and function. EF 60-65%. Normal diastolic function. Mild LA dilation and mild AI.  No evidence of aortic stenosis. No significant MR.        Home Medications    Prior to Admission medications   Medication Sig Start Date End Date Taking? Authorizing Provider  amLODipine (NORVASC) 5 MG tablet Take 1 tablet (5 mg total) by mouth daily. 03/30/16  Yes Leonie Man, MD  aspirin EC 81 MG tablet Take 81 mg by mouth daily.    Yes [provider]  DEPAKOTE 250 MG DR tablet TAKE 1 TABLET BY MOUTH TWICE A DAY Patient taking differently: Take 250 mg by  mouth 2 (two) times daily.  05/30/18  Yes Bedsole, Amy E, MD  eplerenone (INSPRA) 25 MG tablet Take 25 mg by mouth daily.   Yes [provider]  Evolocumab with Infusor 420 MG/3.5ML SOCT Inject 3.5 mLs into the skin every 30 (thirty) days.  05/17/18  Yes [provider]  Garlic 825 MG TABS Take 500 mg by mouth daily.   Yes [provider]  lisinopril (PRINIVIL,ZESTRIL) 40 MG tablet Take 1 tablet (40 mg total) by mouth daily. Patient taking differently: Take 20 mg by mouth 2 (two) times a day.  03/30/16  Yes Leonie Man, MD  Multiple Vitamin (MULTIVITAMIN) capsule Take 1 capsule by mouth daily.    Yes [provider]  NITROSTAT 0.4 MG SL  tablet DISSOLVE 1 TABLET UNDER THE TONGUE FOR CHEST PAIN. MAY REPEAT EVERY 5MINUTES UP TO 3 DOSES. IF NO RELIEF, CALL 911** Patient taking differently: Place 0.4 mg under the tongue every 5 (five) minutes as needed for chest pain.  06/26/15   Leonie Man, MD    Family History Family History  Problem Relation Age of Onset  . Hypertension Mother   . Cancer Father        lung and prostate    Social History Social History   Tobacco Use  . Smoking status: Never Smoker  . Smokeless tobacco: Never Used  Substance Use Topics  . Alcohol use: No    Alcohol/week: 0.0 standard drinks  . Drug use: No     Allergies   Fenofibrate, Rosuvastatin, Pravastatin sodium, and Statins   Review of Systems Review of Systems  Constitutional: Negative for fever.  HENT: Negative for sore throat.   Eyes: Negative for visual disturbance.  Respiratory: Negative for shortness of breath.   Cardiovascular: Negative for chest pain.  Gastrointestinal: Negative for abdominal pain, nausea and vomiting.  Genitourinary: Negative for difficulty urinating.  Musculoskeletal: Positive for arthralgias, gait problem and myalgias. Negative for back pain, neck pain and neck stiffness.  Skin: Negative for rash and wound.  Neurological: Negative for syncope and headaches.     Physical Exam Updated Vital Signs BP 117/79 (BP Location: Right Arm)   Pulse 61   Temp 97.8 F (36.6 C) (Oral)   Resp 19   Ht 5\' 11"  (1.803 m)   Wt 123.9 kg   SpO2 99%   BMI 38.10 kg/m   Physical Exam Vitals signs and nursing note reviewed.  Constitutional:      General: He is not in acute distress.    Appearance: He is well-developed. He is not diaphoretic.  HENT:     Head: Normocephalic and atraumatic.  Eyes:     Conjunctiva/sclera: Conjunctivae normal.  Neck:     Musculoskeletal: Normal range of motion.  Cardiovascular:     Rate and Rhythm: Normal rate and regular rhythm.     Pulses: Normal pulses.  Pulmonary:      Effort: Pulmonary effort is normal. No respiratory distress.  Abdominal:     General: There is no distension.     Palpations: Abdomen is soft.     Tenderness: There is no abdominal tenderness. There is no guarding.  Musculoskeletal:     Left knee: He exhibits decreased range of motion. He exhibits no deformity and no laceration.     Left upper leg: He exhibits tenderness, bony tenderness and swelling. He exhibits no laceration.     Comments: Normal distal pulses, normal movement toes and ankle, reports altered sensation to anterior  tibia, medial foot and dorsum of foot  Skin:    General: Skin is warm and dry.  Neurological:     Mental Status: He is alert and oriented to person, place, and time.      ED Treatments / Results  Labs (all labs ordered are listed, but only abnormal results are displayed) Labs Reviewed  CBC WITH DIFFERENTIAL/PLATELET - Abnormal; Notable for the following components:      Result Value   WBC 11.3 (*)    RBC 3.57 (*)    Hemoglobin 10.9 (*)    HCT 34.2 (*)    Neutro Abs 9.2 (*)    All other components within normal limits  COMPREHENSIVE METABOLIC PANEL - Abnormal; Notable for the following components:   Potassium 6.0 (*)    CO2 18 (*)    Glucose, Bld 138 (*)    BUN 35 (*)    Creatinine, Ser 1.25 (*)    Calcium 8.8 (*)    Total Protein 6.2 (*)    GFR calc non Af Amer 58 (*)    All other components within normal limits  BASIC METABOLIC PANEL - Abnormal; Notable for the following components:   Potassium 5.5 (*)    Chloride 112 (*)    CO2 19 (*)    Glucose, Bld 148 (*)    BUN 38 (*)    Creatinine, Ser 1.27 (*)    Calcium 8.6 (*)    GFR calc non Af Amer 56 (*)    All other components within normal limits  POTASSIUM - Abnormal; Notable for the following components:   Potassium 5.2 (*)    All other components within normal limits  POTASSIUM - Abnormal; Notable for the following components:   Potassium 6.2 (*)    All other components within normal  limits  CBC - Abnormal; Notable for the following components:   RBC 3.42 (*)    Hemoglobin 10.5 (*)    HCT 32.7 (*)    All other components within normal limits  POTASSIUM - Abnormal; Notable for the following components:   Potassium 5.5 (*)    All other components within normal limits  SARS CORONAVIRUS 2 (HOSPITAL ORDER, [place] [facility])  SURGICAL PCR SCREEN  NA AND K (SODIUM & POTASSIUM), RAND UR  POTASSIUM  POTASSIUM  POTASSIUM  TYPE AND SCREEN    EKG None  Radiology Dg Chest Port 1 View  Result Date: 08/22/2018 CLINICAL DATA:  Preop for femur fracture EXAM: PORTABLE CHEST 1 VIEW COMPARISON:  None. FINDINGS: The heart size and mediastinal contours are within normal limits. Both lungs are clear. The visualized skeletal structures are unremarkable. IMPRESSION: No acute cardiopulmonary process. Electronically Signed   By: Prudencio Pair M.D.   On: 08/22/2018 23:40   Dg Femur Min 2 Views Left  Result Date: 08/22/2018 CLINICAL DATA:  Patient tripped and fell EXAM: LEFT FEMUR 2 VIEWS COMPARISON:  None. FINDINGS: There is obliquely oriented comminuted fracture of the midshaft of the femur with medial angulation and overlap of fracture fragments. The fracture line extends to the distal femur, however the total knee arthroplasty appears to be intact. There is overlying soft tissue swelling. IMPRESSION: Comminuted obliquely oriented fracture seen through the mid to distal femur. Left total knee arthroplasty which appears to be intact. Electronically Signed   By: Prudencio Pair M.D.   On: 08/22/2018 22:42    Procedures Procedures (including critical care time)  Medications Ordered in ED Medications  povidone-iodine 10 %  swab 2 application (has no administration in time range)  ceFAZolin (ANCEF) 3 g in dextrose 5 % 50 mL IVPB (has no administration in time range)  tranexamic acid (CYKLOKAPRON) IVPB 1,000 mg (has no administration in time range)  amLODipine (NORVASC)  tablet 5 mg (5 mg Oral Given 08/23/18 0844)  divalproex (DEPAKOTE) DR tablet 250 mg (250 mg Oral Given 08/23/18 0844)  HYDROmorphone (DILAUDID) injection 0.5-1 mg (1 mg Intravenous Given 08/23/18 1230)  methocarbamol (ROBAXIN) tablet 500 mg (500 mg Oral Given 08/23/18 0136)    Or  methocarbamol (ROBAXIN) 500 mg in dextrose 5 % 50 mL IVPB ( Intravenous See Alternative 08/23/18 0136)  polyethylene glycol (MIRALAX / GLYCOLAX) packet 17 g (has no administration in time range)  ondansetron (ZOFRAN) injection 4 mg (4 mg Intravenous Given 08/23/18 0845)  feeding supplement (ENSURE SURGERY) liquid 237 mL (237 mLs Oral Not Given 08/23/18 1212)  multivitamin with minerals tablet 1 tablet (has no administration in time range)  HYDROmorphone (DILAUDID) injection 1 mg (1 mg Intravenous Given 08/22/18 2211)  feeding supplement (ENSURE PRE-SURGERY) liquid 296 mL (296 mLs Oral Given 08/23/18 0209)  chlorhexidine (HIBICLENS) 4 % liquid 4 application (4 application Topical Given 08/23/18 0930)  HYDROmorphone (DILAUDID) injection 1 mg (1 mg Intravenous Given 08/22/18 2340)  sodium chloride 0.9 % bolus 500 mL (0 mLs Intravenous Stopped 08/23/18 0240)  furosemide (LASIX) injection 40 mg (40 mg Intravenous Given 08/23/18 0134)     Initial Impression / Assessment and Plan / ED Course  I have reviewed the triage vital signs and the nursing notes.  Pertinent labs & imaging results that were available during my care of the patient were reviewed by me and considered in my medical decision making (see chart for details).        73yo male with history of CAD, htn, hlpd, seizure disorder, total left knee replacement, presents with concern for fall with left thigh and knee pain. No signs of other injuries by history or exam.   XR confirms femur fracture. NV intact with exception of slight altered sensation. Closed. Discussed with Dr. Lyla Glassing who recommends hospitalist admission and they will see him in the AM.  Rec posterior  splint to hip.  Admitted for further care.  Final Clinical Impressions(s) / ED Diagnoses   Final diagnoses:  Closed displaced oblique fracture of shaft of left femur, initial encounter [facility])    ED Discharge Orders    None       Gareth Morgan, MD 08/23/18 1325

## 2018-08-23 ENCOUNTER — Inpatient Hospital Stay (HOSPITAL_COMMUNITY): Payer: Medicare HMO | Admitting: Certified Registered Nurse Anesthetist

## 2018-08-23 ENCOUNTER — Encounter (HOSPITAL_COMMUNITY)
Admission: EM | Disposition: A | Discharge status: Home Health | Payer: Self-pay | Source: Home / Self Care | Attending: Internal Medicine

## 2018-08-23 ENCOUNTER — Inpatient Hospital Stay (HOSPITAL_COMMUNITY): Payer: Medicare HMO

## 2018-08-23 ENCOUNTER — Other Ambulatory Visit: Payer: Self-pay

## 2018-08-23 ENCOUNTER — Encounter (HOSPITAL_COMMUNITY): Payer: Self-pay | Admitting: General Practice

## 2018-08-23 DIAGNOSIS — S7292XD Unspecified fracture of left femur, subsequent encounter for closed fracture with routine healing: Secondary | ICD-10-CM

## 2018-08-23 HISTORY — PX: FEMUR IM NAIL: SHX1597

## 2018-08-23 LAB — BASIC METABOLIC PANEL
Anion gap: 6 (ref 5–15)
BUN: 38 mg/dL — ABNORMAL HIGH (ref 8–23)
CO2: 19 mmol/L — ABNORMAL LOW (ref 22–32)
Calcium: 8.6 mg/dL — ABNORMAL LOW (ref 8.9–10.3)
Chloride: 112 mmol/L — ABNORMAL HIGH (ref 98–111)
Creatinine, Ser: 1.27 mg/dL — ABNORMAL HIGH (ref 0.61–1.24)
GFR calc Af Amer: >60 mL/min (ref >60)
GFR calc non Af Amer: 56 mL/min — ABNORMAL LOW (ref >60)
Glucose, Bld: 148 mg/dL — ABNORMAL HIGH (ref 70–99)
Potassium: 5.5 mmol/L — ABNORMAL HIGH (ref 3.5–5.1)
Sodium: 137 mmol/L (ref 135–145)

## 2018-08-23 LAB — CBC
HCT: 32.7 % — ABNORMAL LOW (ref 39.0–52.0)
Hemoglobin: 10.5 g/dL — ABNORMAL LOW (ref 13.0–17.0)
MCH: 30.7 pg (ref 26.0–34.0)
MCHC: 32.1 g/dL (ref 30.0–36.0)
MCV: 95.6 fL (ref 80.0–100.0)
Platelets: 159 10*3/uL (ref 150–400)
RBC: 3.42 MIL/uL — ABNORMAL LOW (ref 4.22–5.81)
RDW: 13 % (ref 11.5–15.5)
WBC: 8 10*3/uL (ref 4.0–10.5)
nRBC: 0 % (ref 0.0–0.2)

## 2018-08-23 LAB — POTASSIUM
Potassium: 5.2 mmol/L — ABNORMAL HIGH (ref 3.5–5.1)
Potassium: 6.2 mmol/L — ABNORMAL HIGH (ref 3.5–5.1)
Potassium: 5.5 mmol/L — ABNORMAL HIGH (ref 3.5–5.1)
Potassium: 5.7 mmol/L — ABNORMAL HIGH (ref 3.5–5.1)
Potassium: 5.6 mmol/L — ABNORMAL HIGH (ref 3.5–5.1)

## 2018-08-23 LAB — POCT I-STAT 4, (NA,K, GLUC, HGB,HCT)
Glucose, Bld: 126 mg/dL — ABNORMAL HIGH (ref 70–99)
HCT: 22 % — ABNORMAL LOW (ref 39.0–52.0)
Hemoglobin: 7.5 g/dL — ABNORMAL LOW (ref 13.0–17.0)
Potassium: 5.2 mmol/L — ABNORMAL HIGH (ref 3.5–5.1)
Sodium: 140 mmol/L (ref 135–145)

## 2018-08-23 LAB — NA AND K (SODIUM & POTASSIUM), RAND UR
Potassium Urine: 65 mmol/L
Sodium, Ur: 115 mmol/L

## 2018-08-23 LAB — SURGICAL PCR SCREEN
MRSA, PCR: NEGATIVE
Staphylococcus aureus: NEGATIVE

## 2018-08-23 LAB — HEMOGLOBIN AND HEMATOCRIT, BLOOD
HCT: 30.5 % — ABNORMAL LOW (ref 39.0–52.0)
Hemoglobin: 9.3 g/dL — ABNORMAL LOW (ref 13.0–17.0)

## 2018-08-23 LAB — PREPARE RBC (CROSSMATCH)

## 2018-08-23 SURGERY — INSERTION, INTRAMEDULLARY ROD, FEMUR, RETROGRADE
Anesthesia: Spinal | Laterality: Left

## 2018-08-23 MED ORDER — DEXAMETHASONE SODIUM PHOSPHATE 10 MG/ML IJ SOLN
INTRAMUSCULAR | Status: DC | PRN
  Administered 2018-08-23: 10 mg via INTRAVENOUS

## 2018-08-23 MED ORDER — PROPOFOL 10 MG/ML IV BOLUS
INTRAVENOUS | Status: AC
  Filled 2018-08-23: qty 40

## 2018-08-23 MED ORDER — HYDROMORPHONE HCL 1 MG/ML IJ SOLN
0.5000 mg | INTRAMUSCULAR | Status: DC | PRN
  Administered 2018-08-24 – 2018-08-25 (×3): 1 mg via INTRAVENOUS
  Filled 2018-08-23 (×3): qty 1

## 2018-08-23 MED ORDER — FENTANYL CITRATE (PF) 100 MCG/2ML IJ SOLN
INTRAMUSCULAR | Status: DC | PRN
  Administered 2018-08-23: 50 ug via INTRAVENOUS

## 2018-08-23 MED ORDER — SODIUM CHLORIDE 0.9 % IV SOLN
INTRAVENOUS | Status: DC
  Administered 2018-08-23 – 2018-08-25 (×2): via INTRAVENOUS

## 2018-08-23 MED ORDER — ASPIRIN EC 81 MG PO TBEC
81.0000 mg | DELAYED_RELEASE_TABLET | Freq: Two times a day (BID) | ORAL | Status: DC
  Administered 2018-08-23 – 2018-08-26 (×6): 81 mg via ORAL
  Filled 2018-08-23 (×6): qty 1

## 2018-08-23 MED ORDER — DIVALPROEX SODIUM 250 MG PO DR TAB
250.0000 mg | DELAYED_RELEASE_TABLET | Freq: Two times a day (BID) | ORAL | Status: DC
  Administered 2018-08-23 – 2018-08-26 (×8): 250 mg via ORAL
  Filled 2018-08-23 (×8): qty 1

## 2018-08-23 MED ORDER — FERROUS SULFATE 325 (65 FE) MG PO TABS
325.0000 mg | ORAL_TABLET | Freq: Three times a day (TID) | ORAL | Status: DC
  Administered 2018-08-24 – 2018-08-26 (×8): 325 mg via ORAL
  Filled 2018-08-23 (×8): qty 1

## 2018-08-23 MED ORDER — SODIUM CHLORIDE 0.9 % IV SOLN
INTRAVENOUS | Status: DC | PRN
  Administered 2018-08-23: 40 ug/min via INTRAVENOUS

## 2018-08-23 MED ORDER — PHENYLEPHRINE HCL (PRESSORS) 10 MG/ML IV SOLN
INTRAVENOUS | Status: AC
  Filled 2018-08-23: qty 2

## 2018-08-23 MED ORDER — MIDAZOLAM HCL 5 MG/5ML IJ SOLN
INTRAMUSCULAR | Status: DC | PRN
  Administered 2018-08-23: 2 mg via INTRAVENOUS

## 2018-08-23 MED ORDER — PANTOPRAZOLE SODIUM 40 MG PO TBEC
40.0000 mg | DELAYED_RELEASE_TABLET | Freq: Every day | ORAL | Status: DC
  Administered 2018-08-24 – 2018-08-26 (×3): 40 mg via ORAL
  Filled 2018-08-23 (×3): qty 1

## 2018-08-23 MED ORDER — ONDANSETRON HCL 4 MG/2ML IJ SOLN
INTRAMUSCULAR | Status: DC | PRN
  Administered 2018-08-23: 4 mg via INTRAVENOUS

## 2018-08-23 MED ORDER — EPHEDRINE SULFATE-NACL 50-0.9 MG/10ML-% IV SOSY
PREFILLED_SYRINGE | INTRAVENOUS | Status: DC | PRN
  Administered 2018-08-23 (×3): 10 mg via INTRAVENOUS

## 2018-08-23 MED ORDER — SODIUM CHLORIDE 0.9% IV SOLUTION: Freq: Once | INTRAVENOUS | Status: DC

## 2018-08-23 MED ORDER — 0.9 % SODIUM CHLORIDE (POUR BTL) OPTIME
TOPICAL | Status: DC | PRN
  Administered 2018-08-23: 1000 mL

## 2018-08-23 MED ORDER — POVIDONE-IODINE 10 % EX SWAB: 2.0000 "application " | Freq: Once | CUTANEOUS | Status: DC

## 2018-08-23 MED ORDER — METOCLOPRAMIDE HCL 5 MG PO TABS: 5.0000 mg | ORAL_TABLET | Freq: Three times a day (TID) | ORAL | Status: DC | PRN

## 2018-08-23 MED ORDER — LACTATED RINGERS IV SOLN
INTRAVENOUS | Status: DC
  Administered 2018-08-23: 17:00:00 via INTRAVENOUS

## 2018-08-23 MED ORDER — SODIUM CHLORIDE 0.9 % IV SOLN
INTRAVENOUS | Status: DC
  Administered 2018-08-23 (×3): via INTRAVENOUS

## 2018-08-23 MED ORDER — ONDANSETRON HCL 4 MG PO TABS: 4.0000 mg | ORAL_TABLET | Freq: Four times a day (QID) | ORAL | Status: DC | PRN

## 2018-08-23 MED ORDER — EPHEDRINE 5 MG/ML INJ
INTRAVENOUS | Status: AC
  Filled 2018-08-23: qty 10

## 2018-08-23 MED ORDER — POLYETHYLENE GLYCOL 3350 17 G PO PACK: 17.0000 g | PACK | Freq: Every day | ORAL | Status: DC | PRN

## 2018-08-23 MED ORDER — ACETAMINOPHEN 500 MG PO TABS: 1000.0000 mg | ORAL_TABLET | Freq: Once | ORAL | Status: DC

## 2018-08-23 MED ORDER — BUPIVACAINE IN DEXTROSE 0.75-8.25 % IT SOLN
INTRATHECAL | Status: DC | PRN
  Administered 2018-08-23: 1.8 mL via INTRATHECAL

## 2018-08-23 MED ORDER — MUPIROCIN 2 % EX OINT: 1.0000 "application " | TOPICAL_OINTMENT | Freq: Two times a day (BID) | CUTANEOUS | Status: DC

## 2018-08-23 MED ORDER — DOCUSATE SODIUM 100 MG PO CAPS
100.0000 mg | ORAL_CAPSULE | Freq: Two times a day (BID) | ORAL | Status: DC
  Administered 2018-08-23 – 2018-08-26 (×6): 100 mg via ORAL
  Filled 2018-08-23 (×6): qty 1

## 2018-08-23 MED ORDER — METHOCARBAMOL 1000 MG/10ML IJ SOLN
500.0000 mg | Freq: Four times a day (QID) | INTRAVENOUS | Status: DC | PRN
  Filled 2018-08-23: qty 5

## 2018-08-23 MED ORDER — PROPOFOL 500 MG/50ML IV EMUL
INTRAVENOUS | Status: DC | PRN
  Administered 2018-08-23: 75 ug/kg/min via INTRAVENOUS

## 2018-08-23 MED ORDER — ONDANSETRON HCL 4 MG/2ML IJ SOLN: 4.0000 mg | Freq: Four times a day (QID) | INTRAMUSCULAR | Status: DC | PRN

## 2018-08-23 MED ORDER — DEXTROSE 5 % IV SOLN
3.0000 g | INTRAVENOUS | Status: AC
  Administered 2018-08-23: 3 g via INTRAVENOUS
  Filled 2018-08-23: qty 3

## 2018-08-23 MED ORDER — FENTANYL CITRATE (PF) 100 MCG/2ML IJ SOLN
INTRAMUSCULAR | Status: AC
  Filled 2018-08-23: qty 2

## 2018-08-23 MED ORDER — HYDROMORPHONE HCL 1 MG/ML IJ SOLN
0.5000 mg | INTRAMUSCULAR | Status: DC | PRN
  Administered 2018-08-23 (×5): 1 mg via INTRAVENOUS
  Filled 2018-08-23 (×5): qty 1

## 2018-08-23 MED ORDER — ENSURE SURGERY PO LIQD
237.0000 mL | Freq: Two times a day (BID) | ORAL | Status: DC
  Administered 2018-08-23: 237 mL via ORAL
  Filled 2018-08-23 (×8): qty 237

## 2018-08-23 MED ORDER — ENSURE PRE-SURGERY PO LIQD
296.0000 mL | Freq: Once | ORAL | Status: AC
  Administered 2018-08-23: 296 mL via ORAL
  Filled 2018-08-23: qty 296

## 2018-08-23 MED ORDER — ALBUMIN HUMAN 5 % IV SOLN
INTRAVENOUS | Status: DC | PRN
  Administered 2018-08-23 (×2): via INTRAVENOUS

## 2018-08-23 MED ORDER — TRANEXAMIC ACID-NACL 1000-0.7 MG/100ML-% IV SOLN
1000.0000 mg | INTRAVENOUS | Status: AC
  Administered 2018-08-23: 18:00:00 1000 mg via INTRAVENOUS
  Filled 2018-08-23 (×2): qty 100

## 2018-08-23 MED ORDER — CEFAZOLIN SODIUM-DEXTROSE 2-4 GM/100ML-% IV SOLN
2.0000 g | Freq: Four times a day (QID) | INTRAVENOUS | Status: AC
  Administered 2018-08-24 (×2): 2 g via INTRAVENOUS
  Filled 2018-08-23 (×2): qty 100

## 2018-08-23 MED ORDER — PROMETHAZINE HCL 25 MG/ML IJ SOLN: 6.2500 mg | INTRAMUSCULAR | Status: DC | PRN

## 2018-08-23 MED ORDER — PHENOL 1.4 % MT LIQD
1.0000 | OROMUCOSAL | Status: DC | PRN
  Filled 2018-08-23: qty 177

## 2018-08-23 MED ORDER — CALCIUM GLUCONATE-NACL 1-0.675 GM/50ML-% IV SOLN
1.0000 g | Freq: Once | INTRAVENOUS | Status: AC
  Administered 2018-08-23: 1000 mg via INTRAVENOUS
  Filled 2018-08-23: qty 50

## 2018-08-23 MED ORDER — ONDANSETRON HCL 4 MG/2ML IJ SOLN
4.0000 mg | Freq: Four times a day (QID) | INTRAMUSCULAR | Status: DC | PRN
  Administered 2018-08-23: 09:00:00 4 mg via INTRAVENOUS
  Filled 2018-08-23: qty 2

## 2018-08-23 MED ORDER — METOCLOPRAMIDE HCL 5 MG/ML IJ SOLN: 5.0000 mg | Freq: Three times a day (TID) | INTRAMUSCULAR | Status: DC | PRN

## 2018-08-23 MED ORDER — ADULT MULTIVITAMIN W/MINERALS CH
1.0000 | ORAL_TABLET | Freq: Every day | ORAL | Status: DC
  Administered 2018-08-24 – 2018-08-26 (×3): 1 via ORAL
  Filled 2018-08-23 (×3): qty 1

## 2018-08-23 MED ORDER — METHOCARBAMOL 500 MG PO TABS
500.0000 mg | ORAL_TABLET | Freq: Four times a day (QID) | ORAL | Status: DC | PRN
  Administered 2018-08-23 – 2018-08-26 (×7): 500 mg via ORAL
  Filled 2018-08-23 (×7): qty 1

## 2018-08-23 MED ORDER — MENTHOL 3 MG MT LOZG
1.0000 | LOZENGE | OROMUCOSAL | Status: DC | PRN
  Filled 2018-08-23: qty 9

## 2018-08-23 MED ORDER — ALUM & MAG HYDROXIDE-SIMETH 200-200-20 MG/5ML PO SUSP: 30.0000 mL | ORAL | Status: DC | PRN

## 2018-08-23 MED ORDER — AMLODIPINE BESYLATE 5 MG PO TABS
5.0000 mg | ORAL_TABLET | Freq: Every day | ORAL | Status: DC
  Administered 2018-08-23 – 2018-08-26 (×4): 5 mg via ORAL
  Filled 2018-08-23 (×4): qty 1

## 2018-08-23 MED ORDER — SODIUM POLYSTYRENE SULFONATE 15 GM/60ML PO SUSP
30.0000 g | Freq: Once | ORAL | Status: DC
  Filled 2018-08-23: qty 120

## 2018-08-23 MED ORDER — CHLORHEXIDINE GLUCONATE 4 % EX LIQD
60.0000 mL | Freq: Once | CUTANEOUS | Status: AC
  Administered 2018-08-23: 4 via TOPICAL
  Filled 2018-08-23: qty 60

## 2018-08-23 MED ORDER — FUROSEMIDE 10 MG/ML IJ SOLN
20.0000 mg | Freq: Once | INTRAMUSCULAR | Status: AC
  Administered 2018-08-23: 20 mg via INTRAVENOUS
  Filled 2018-08-23: qty 2

## 2018-08-23 MED ORDER — HYDROMORPHONE HCL 1 MG/ML IJ SOLN: 0.2500 mg | INTRAMUSCULAR | Status: DC | PRN

## 2018-08-23 MED ORDER — SODIUM CHLORIDE 0.9 % IR SOLN
Status: DC | PRN
  Administered 2018-08-23: 1000 mL

## 2018-08-23 MED ORDER — MIDAZOLAM HCL 2 MG/2ML IJ SOLN
INTRAMUSCULAR | Status: AC
  Filled 2018-08-23: qty 2

## 2018-08-23 SURGICAL SUPPLY — 66 items
ADH SKN CLS APL DERMABOND .7 (GAUZE/BANDAGES/DRESSINGS) ×1
BAG SPEC THK2 15X12 ZIP CLS (MISCELLANEOUS) ×1
BAG ZIPLOCK 12X15 (MISCELLANEOUS) ×2 IMPLANT
BIT DRILL CALIBRATED 4.3MMX365 (DRILL) IMPLANT
BIT DRILL CROWE PNT TWST 4.5MM (DRILL) IMPLANT
BLADE SAW SGTL 11.0X1.19X90.0M (BLADE) IMPLANT
BLADE SURG 15 STRL LF DISP TIS (BLADE) ×1 IMPLANT
BLADE SURG 15 STRL SS (BLADE) ×2
CABLE CERLAGE W/CRIMP 1.8 (Cable) ×2 IMPLANT
COVER BACK TABLE 60X90IN (DRAPES) ×2 IMPLANT
COVER SURGICAL LIGHT HANDLE (MISCELLANEOUS) ×2 IMPLANT
COVER WAND RF STERILE (DRAPES) IMPLANT
DERMABOND ADVANCED (GAUZE/BANDAGES/DRESSINGS) ×1
DERMABOND ADVANCED .7 DNX12 (GAUZE/BANDAGES/DRESSINGS) IMPLANT
DRAPE ORTHO SPLIT 77X108 STRL (DRAPES) ×4
DRAPE STERI IOBAN 125X83 (DRAPES) ×2 IMPLANT
DRAPE SURG ORHT 6 SPLT 77X108 (DRAPES) ×2 IMPLANT
DRILL CALIBRATED 4.3MMX365 (DRILL) ×2
DRILL CROWE POINT TWIST 4.5MM (DRILL) ×2
DRSG AQUACEL AG ADV 3.5X 4 (GAUZE/BANDAGES/DRESSINGS) ×1 IMPLANT
DRSG AQUACEL AG ADV 3.5X14 (GAUZE/BANDAGES/DRESSINGS) ×1 IMPLANT
DRSG PAD ABDOMINAL 8X10 ST (GAUZE/BANDAGES/DRESSINGS) ×2 IMPLANT
DURAPREP 26ML APPLICATOR (WOUND CARE) ×2 IMPLANT
ELECT PENCIL ROCKER SW 15FT (MISCELLANEOUS) ×2 IMPLANT
ELECT REM PT RETURN 15FT ADLT (MISCELLANEOUS) ×2 IMPLANT
FACESHIELD WRAPAROUND (MASK) ×4 IMPLANT
FACESHIELD WRAPAROUND OR TEAM (MASK) IMPLANT
GAUZE SPONGE 4X4 12PLY STRL (GAUZE/BANDAGES/DRESSINGS) ×4 IMPLANT
GLOVE BIOGEL M 7.0 STRL (GLOVE) IMPLANT
GLOVE BIOGEL PI IND STRL 7.5 (GLOVE) ×1 IMPLANT
GLOVE BIOGEL PI IND STRL 8.5 (GLOVE) ×1 IMPLANT
GLOVE BIOGEL PI INDICATOR 7.5 (GLOVE) ×1
GLOVE BIOGEL PI INDICATOR 8.5 (GLOVE) ×1
GLOVE ECLIPSE 8.0 STRL XLNG CF (GLOVE) IMPLANT
GLOVE ORTHO TXT STRL SZ7.5 (GLOVE) ×4 IMPLANT
GLOVE SURG ORTHO 8.0 STRL STRW (GLOVE) ×2 IMPLANT
GOWN STRL REUS W/TWL LRG LVL3 (GOWN DISPOSABLE) ×2 IMPLANT
GOWN STRL REUS W/TWL XL LVL3 (GOWN DISPOSABLE) ×4 IMPLANT
GUIDEPIN 3.2X17.5 THRD DISP (PIN) ×1 IMPLANT
GUIDEWIRE BEAD TIP (WIRE) ×1 IMPLANT
KIT BASIN OR (CUSTOM PROCEDURE TRAY) ×2 IMPLANT
KIT TURNOVER KIT A (KITS) IMPLANT
MANIFOLD NEPTUNE II (INSTRUMENTS) ×2 IMPLANT
NAIL FEM RETRO 13.5X340 (Nail) ×1 IMPLANT
NS IRRIG 1000ML POUR BTL (IV SOLUTION) ×2 IMPLANT
PACK GENERAL/GYN (CUSTOM PROCEDURE TRAY) ×2 IMPLANT
PROTECTOR NERVE ULNAR (MISCELLANEOUS) ×2 IMPLANT
SCREW CORT TI DBL LEAD 5X36 (Screw) ×1 IMPLANT
SCREW CORT TI DBL LEAD 5X58 (Screw) ×1 IMPLANT
SCREW CORT TI DBL LEAD 5X60 (Screw) ×1 IMPLANT
SCREW CORT TI DBL LEAD 5X65 (Screw) ×1 IMPLANT
SCREW CORT TI DBL LEAD 5X85 (Screw) ×1 IMPLANT
SET PAD KNEE POSITIONER (MISCELLANEOUS) ×2 IMPLANT
STAPLER VISISTAT 35W (STAPLE) ×2 IMPLANT
STOCKINETTE 8 INCH (MISCELLANEOUS) ×1 IMPLANT
SUCTION FRAZIER HANDLE 12FR (TUBING) ×1
SUCTION TUBE FRAZIER 12FR DISP (TUBING) IMPLANT
SUT STRATAFIX 1PDS 45CM VIOLET (SUTURE) ×1 IMPLANT
SUT VIC AB 0 CT1 27 (SUTURE) ×2
SUT VIC AB 0 CT1 27XBRD ANTBC (SUTURE) ×1 IMPLANT
SUT VIC AB 1 CT1 36 (SUTURE) ×3 IMPLANT
SUT VIC AB 2-0 CT1 27 (SUTURE) ×6
SUT VIC AB 2-0 CT1 TAPERPNT 27 (SUTURE) ×2 IMPLANT
TOWEL OR 17X26 10 PK STRL BLUE (TOWEL DISPOSABLE) ×4 IMPLANT
TRAY FOLEY MTR SLVR 16FR STAT (SET/KITS/TRAYS/PACK) ×2 IMPLANT
WATER STERILE IRR 1000ML POUR (IV SOLUTION) ×2 IMPLANT

## 2018-08-23 NOTE — Anesthesia Preprocedure Evaluation (Addendum)
Anesthesia Evaluation  Patient identified by MRN, date of birth, ID band Patient awake    Reviewed: Allergy & Precautions, NPO status , Patient's Chart, lab work & pertinent test results  History of Anesthesia Complications Negative for: history of anesthetic complications  Airway Mallampati: II  TM Distance: >3 FB Neck ROM: Full    Dental no notable dental hx. (+) Dental Advisory Given   Pulmonary neg pulmonary ROS,    Pulmonary exam normal        Cardiovascular hypertension, Pt. on medications + CAD and + Cardiac Stents  Normal cardiovascular exam    Stress Test - 08/04/16- Care Everywhere- no ischemia    Neuro/Psych Seizures -, Well Controlled,  negative psych ROS   GI/Hepatic negative GI ROS, Neg liver ROS,   Endo/Other  negative endocrine ROS  Renal/GU Renal InsufficiencyRenal disease  negative genitourinary   Musculoskeletal negative musculoskeletal ROS (+)   Abdominal (+) - obese,   Peds negative pediatric ROS (+)  Hematology  (+) anemia ,   Anesthesia Other Findings Hyperlipidemia   Reproductive/Obstetrics negative OB ROS                            Anesthesia Physical  Anesthesia Plan  ASA: III  Anesthesia Plan: Spinal   Post-op Pain Management:    Induction:   PONV Risk Score and Plan: 2 and Ondansetron and Propofol infusion  Airway Management Planned: Natural Airway  Additional Equipment:   Intra-op Plan:   Post-operative Plan:   Informed Consent: I have reviewed the patients History and Physical, chart, labs and discussed the procedure including the risks, benefits and alternatives for the proposed anesthesia with the patient or authorized representative who has indicated his/her understanding and acceptance.     Dental advisory given  Plan Discussed with: CRNA and Anesthesiologist  Anesthesia Plan Comments:        Anesthesia Quick Evaluation

## 2018-08-23 NOTE — H&P (View-Only) (Signed)
Reason for Consult:  Left periprosthetic distal femur fracture Referring Physician: ED Physician  Joshua Stark is an 73 y.o. male.  HPI: Joshua Stark is a 73 y.o. male who presented to the emergency department with left knee pain after a fall at home.  Patient reports he has been in his usual state of health and was having an uneventful day when he was working on something in his garage and tripped over a tool.  He reports feeling as though he twisted the left knee and fell to the ground, but did not hit his head or lose consciousness.  He immediately experienced severe pain involving the left thigh and knee and was unable to stand.  Dr. Alvan Dame discussed the patient's current circumstance and the need for a procedure to fix the left femur.  Dr. Alvan Dame discussed the risk and benefits and expectations and the patient does wish to proceed with surgery.  Surgery will be scheduled for later today for an ORIF of the left periprosthetic femur fracture per Dr. Alvan Dame.  Risks included but not limited to the risk of anesthesia, blood clots, nerve damage, blood vessel damage, failure of the prosthesis, infection up to including death.   Past Medical History:  Diagnosis Date  . Arthritis   . CAD S/P percutaneous coronary angioplasty 1999   PCI- prox LAD @ D1, SP1 trifurcation.  3.0 mm x 16 mm AVE-GFX BMS  . History of nuclear stress test August 2012   Treadmill Myoview: 9 minutes, 10 METS --> hypertensive response.  No evidence of ischemia or infarction.  EF 64%  . HLD (hyperlipidemia)   . Hypertension   . Obesity (BMI 30-39.9)   . Seizure disorder ([place])   . Seizures ([place])    hx of years ago     Past Surgical History:  Procedure Laterality Date  . CARDIAC CATHETERIZATION  01/20/1998  . CORONARY ANGIOPLASTY  01/20/1998   PCI to LAD ,prox LAD at trifurcation for D1and SP1.3.0-x16-mm AVE-GFX bare-metal stent  . NM MYOCAR PERF WALL MOTION  Aug 2 ,2012   treadmill myoview exercised 9 minutes, reacing 10  metaboblic equivlents,had ahypertensive reponse .EF64%  . TOTAL KNEE ARTHROPLASTY Left 08/17/2016   Procedure: LEFT TOTAL KNEE ARTHROPLASTY;  Surgeon: Paralee Cancel, MD;  Location: [facility];  Service: Orthopedics;  Laterality: Left;  . TRANSTHORACIC ECHOCARDIOGRAM  09/20/2013   normal LV size and function. EF 60-65%. Normal diastolic function. Mild LA dilation and mild AI.  No evidence of aortic stenosis. No significant MR.    Family History  Problem Relation Age of Onset  . Hypertension Mother   . Cancer Father        lung and prostate    Social History:  reports that he has never smoked. He has never used smokeless tobacco. He reports that he does not drink alcohol or use drugs.  Allergies:  Allergies  Allergen Reactions  . Fenofibrate Other (See Comments)    Arthritis, cramping   . Rosuvastatin Other (See Comments)    REACTION: cramps   . Pravastatin Sodium Other (See Comments)    REACTION: cramps  . Statins Other (See Comments)    Cramping      Results for orders placed or performed during the hospital encounter of 08/22/18 (from the past 48 hour(s))  CBC with Differential     Status: Abnormal   Collection Time: 08/22/18  9:53 PM  Result Value Ref Range   WBC 11.3 (H) 4.0 - 10.5 K/uL   RBC  3.57 (L) 4.22 - 5.81 MIL/uL   Hemoglobin 10.9 (L) 13.0 - 17.0 g/dL   HCT 34.2 (L) 39.0 - 52.0 %   MCV 95.8 80.0 - 100.0 fL   MCH 30.5 26.0 - 34.0 pg   MCHC 31.9 30.0 - 36.0 g/dL   RDW 13.0 11.5 - 15.5 %   Platelets 184 150 - 400 K/uL   nRBC 0.0 0.0 - 0.2 %   Neutrophils Relative % 81 %   Neutro Abs 9.2 (H) 1.7 - 7.7 K/uL   Lymphocytes Relative 11 %   Lymphs Abs 1.2 0.7 - 4.0 K/uL   Monocytes Relative 7 %   Monocytes Absolute 0.8 0.1 - 1.0 K/uL   Eosinophils Relative 0 %   Eosinophils Absolute 0.0 0.0 - 0.5 K/uL   Basophils Relative 0 %   Basophils Absolute 0.0 0.0 - 0.1 K/uL   Immature Granulocytes 1 %   Abs Immature Granulocytes 0.06 0.00 - 0.07 K/uL    Comment: Performed  at [facility], [place] [address], [place], [place] [address]  Comprehensive metabolic panel     Status: Abnormal   Collection Time: 08/22/18  9:53 PM  Result Value Ref Range   Sodium 137 135 - 145 mmol/L   Potassium 6.0 (H) 3.5 - 5.1 mmol/L   Chloride 110 98 - 111 mmol/L   CO2 18 (L) 22 - 32 mmol/L   Glucose, Bld 138 (H) 70 - 99 mg/dL   BUN 35 (H) 8 - 23 mg/dL   Creatinine, Ser 1.25 (H) 0.61 - 1.24 mg/dL   Calcium 8.8 (L) 8.9 - 10.3 mg/dL   Total Protein 6.2 (L) 6.5 - 8.1 g/dL   Albumin 3.6 3.5 - 5.0 g/dL   AST 21 15 - 41 U/L   ALT 19 0 - 44 U/L   Alkaline Phosphatase 62 38 - 126 U/L   Total Bilirubin 0.4 0.3 - 1.2 mg/dL   GFR calc non Af Amer 58 (L) >60 mL/min   GFR calc Af Amer >60 >60 mL/min   Anion gap 9 5 - 15    Comment: Performed at [facility], [place] [address], [place], [place] [address]  SARS Coronavirus 2 (CEPHEID - Performed in [place] hospital lab), Hosp Order     Status: None   Collection Time: 08/22/18  9:53 PM   Specimen: Nasopharyngeal Swab  Result Value Ref Range   SARS Coronavirus 2 NEGATIVE NEGATIVE    Comment: (NOTE) If result is NEGATIVE SARS-CoV-2 target nucleic acids are NOT DETECTED. The SARS-CoV-2 RNA is generally detectable in upper and lower  respiratory specimens during the acute phase of infection. The lowest  concentration of SARS-CoV-2 viral copies this assay can detect is 250  copies / mL. A negative result does not preclude SARS-CoV-2 infection  and should not be used as the sole basis for treatment or other  patient management decisions.  A negative result may occur with  improper specimen collection / handling, submission of specimen other  than nasopharyngeal swab, presence of viral mutation(s) within the  areas targeted by this assay, and inadequate number of viral copies  (<250 copies / mL). A negative result must be combined with clinical  observations, patient history, and epidemiological  information. If result is POSITIVE SARS-CoV-2 target nucleic acids are DETECTED. The SARS-CoV-2 RNA is generally detectable in upper and lower  respiratory specimens dur ing the acute phase of infection.  Positive  results are indicative of active infection with SARS-CoV-2.  Clinical  correlation with patient history and other diagnostic information is  necessary to determine patient infection status.  Positive results do  not rule out bacterial infection or co-infection with other viruses. If result is PRESUMPTIVE POSTIVE SARS-CoV-2 nucleic acids MAY BE PRESENT.   A presumptive positive result was obtained on the submitted specimen  and confirmed on repeat testing.  While 2019 novel coronavirus  (SARS-CoV-2) nucleic acids may be present in the submitted sample  additional confirmatory testing may be necessary for epidemiological  and / or clinical management purposes  to differentiate between  SARS-CoV-2 and other Sarbecovirus currently known to infect humans.  If clinically indicated additional testing with an alternate test  methodology [phone]) is advised. The SARS-CoV-2 RNA is generally  detectable in upper and lower respiratory sp ecimens during the acute  phase of infection. The expected result is Negative. Fact Sheet for Patients:  StrictlyIdeas.no Fact Sheet for Healthcare Providers: BankingDealers.co.za This test is not yet approved or cleared by the [place] FDA and has been authorized for detection and/or diagnosis of SARS-CoV-2 by FDA under an Emergency Use Authorization (EUA).  This EUA will remain in effect (meaning this test can be used) for the duration of the COVID-19 declaration under Section 564(b)(1) of the Act, 21 U.S.C. section 360bbb-3(b)(1), unless the authorization is terminated or revoked sooner. Performed at [facility], [place] [address] [place], [place] [address]   Na and K (sodium  & potassium), rand urine     Status: None   Collection Time: 08/22/18 11:18 PM  Result Value Ref Range   Sodium, Ur 115 mmol/L   Potassium Urine 65 mmol/L    Comment: Performed at [facility], [place] [address] [place], [place] [address]  Type and screen [place]     Status: None   Collection Time: 08/23/18  2:00 AM  Result Value Ref Range   ABO/RH(D) A POS    Antibody Screen NEG    Sample Expiration      08/26/2018,2359 Performed at [facility], [place] [address] [place], [place] [address]   Surgical PCR screen     Status: None   Collection Time: 08/23/18  2:03 AM   Specimen: Nasal Mucosa; Nasal Swab  Result Value Ref Range   MRSA, PCR NEGATIVE NEGATIVE   Staphylococcus aureus NEGATIVE NEGATIVE    Comment: (NOTE) The Xpert SA Assay (FDA approved for NASAL specimens in patients 59 years of age and older), is one component of a comprehensive surveillance program. It is not intended to diagnose infection nor to guide or monitor treatment. Performed at [facility], [place] [address] [place], [place] [address]   Basic metabolic panel     Status: Abnormal   Collection Time: 08/23/18  2:13 AM  Result Value Ref Range   Sodium 137 135 - 145 mmol/L   Potassium 5.5 (H) 3.5 - 5.1 mmol/L   Chloride 112 (H) 98 - 111 mmol/L   CO2 19 (L) 22 - 32 mmol/L   Glucose, Bld 148 (H) 70 - 99 mg/dL   BUN 38 (H) 8 - 23 mg/dL   Creatinine, Ser 1.27 (H) 0.61 - 1.24 mg/dL   Calcium 8.6 (L) 8.9 - 10.3 mg/dL   GFR calc non Af Amer 56 (L) >60 mL/min   GFR calc Af Amer >60 >60 mL/min   Anion gap 6 5 - 15    Comment: Performed at [facility], [place]  [address] [place], [place] [address]  CBC     Status: Abnormal   Collection Time: 08/23/18  2:13 AM  Result Value Ref Range   WBC 8.0 4.0 - 10.5 K/uL   RBC 3.42 (L) 4.22 - 5.81 MIL/uL   Hemoglobin 10.5 (L) 13.0 - 17.0 g/dL   HCT 32.7 (L) 39.0 - 52.0 %   MCV  95.6 80.0 - 100.0 fL   MCH 30.7 26.0 - 34.0 pg   MCHC 32.1 30.0 - 36.0 g/dL   RDW 13.0 11.5 - 15.5 %   Platelets 159 150 - 400 K/uL   nRBC 0.0 0.0 - 0.2 %    Comment: Performed at [facility], [place] [address], [place], [place] [address]  Potassium     Status: Abnormal   Collection Time: 08/23/18  7:23 AM  Result Value Ref Range   Potassium 5.2 (H) 3.5 - 5.1 mmol/L    Comment: Performed at [facility], [place] [address] [place], [place] [address]    Dg Chest Port 1 View  Result Date: 08/22/2018 CLINICAL DATA:  Preop for femur fracture EXAM: PORTABLE CHEST 1 VIEW COMPARISON:  None. FINDINGS: The heart size and mediastinal contours are within normal limits. Both lungs are clear. The visualized skeletal structures are unremarkable. IMPRESSION: No acute cardiopulmonary process. Electronically Signed   By: Prudencio Pair M.D.   On: 08/22/2018 23:40   Dg Femur Min 2 Views Left  Result Date: 08/22/2018 CLINICAL DATA:  Patient tripped and fell EXAM: LEFT FEMUR 2 VIEWS COMPARISON:  None. FINDINGS: There is obliquely oriented comminuted fracture of the midshaft of the femur with medial angulation and overlap of fracture fragments. The fracture line extends to the distal femur, however the total knee arthroplasty appears to be intact. There is overlying soft tissue swelling. IMPRESSION: Comminuted obliquely oriented fracture seen through the mid to distal femur. Left total knee arthroplasty which appears to be intact. Electronically Signed   By: Prudencio Pair M.D.   On: 08/22/2018 22:42    Review of Systems  Constitutional: Negative.   HENT: Negative.   Eyes: Negative.   Respiratory: Negative.   Cardiovascular: Negative.   Gastrointestinal: Negative.   Genitourinary: Negative.   Musculoskeletal: Positive for joint pain.  Skin: Negative.   Neurological: Negative.   Endo/Heme/Allergies: Negative.   Psychiatric/Behavioral: Negative.    Blood pressure 117/79,  pulse 61, temperature 97.8 F (36.6 C), temperature source Oral, resp. rate 19, height 5\' 11"  (1.803 m), weight 123.9 kg, SpO2 99 %. Physical Exam  Constitutional: He is oriented to person, place, and time. He appears well-developed.  HENT:  Head: Normocephalic.  Eyes: Pupils are equal, round, and reactive to light.  Neck: Neck supple. No JVD present. No tracheal deviation present. No thyromegaly present.  Cardiovascular: Normal rate, regular rhythm and intact distal pulses.  Respiratory: Effort normal and breath sounds normal. No respiratory distress. He has no wheezes.  GI: Soft. There is no abdominal tenderness. There is no guarding.  Musculoskeletal:     Left knee: He exhibits decreased range of motion, swelling, deformity and bony tenderness. He exhibits no erythema. Tenderness found.     Left upper leg: He exhibits tenderness, bony tenderness, swelling and deformity.  Lymphadenopathy:    He has no cervical adenopathy.  Neurological: He is alert and oriented to person, place, and time.  Skin: Skin is warm and dry.  Psychiatric: He has a normal mood and affect.    Assessment/Plan: Left distal femur periprosthetic fracture  Patient n.p.o. now and will continue as surgery is planned for later today.  Surgery plan is for an ORIF of the distal left femur periprosthetic fracture per Dr. Alvan Dame.  Risks, benefits and expectations of procedure were discussed with the patient, patient understands and wishes to proceed with surgery.  Risks, benefits and expectations were discussed with the patient.  Risks including but not limited to the risk of anesthesia, blood clots, nerve damage, blood vessel damage, failure of the prosthesis, infection and up to and including death.  Patient understand the risks, benefits and expectations and wishes to proceed with surgery.   Lucille Passy [facility] 08/23/2018, 9:27 AM

## 2018-08-23 NOTE — Progress Notes (Signed)
PROGRESS NOTE    Joshua Stark  RCV:[id number] DOB: Aug 10, 1945 DOA: 08/22/2018 PCP: Jinny Sanders, MD    Brief Narrative:  Joshua Stark is a 73 y.o. male with medical history significant for coronary artery disease, hypertension, seizure disorder, and chronic kidney disease stage III, now presenting to the emergency department with left knee pain after a fall at home. Assessment & Plan:   Principal Problem:   Femur fracture, left (HCC) Active Problems:   Anemia of chronic disease   Seizure disorder (HCC)   Hyperkalemia   CAD S/P percutaneous coronary angioplasty   Essential hypertension   CKD (chronic kidney disease) stage 3, GFR 30-59 ml/min (HCC)   Left femur fracture following a mechanical fall X-ray showing mid distal left femur fracture. Orthopedic consulted and plan for surgery today. Gentle hydration, pain control.   Hyperkalemia with stage III CKD. Another dose of IV Lasix given continue to hold lisinopril and eplerenone. EKG does not show peaked T waves. 1 g calcium gluconate ordered.   Hypertension Well-controlled.   History of coronary artery disease Patient denies any chest pain at this time.  Hold aspirin and ACE inhibitor.   Anemia of chronic disease Continue to monitor hemoglobin.   Seizure disorder Continue with Depakote      DVT prophylaxis: SCDs Code Status: (Full code Family Communication: None at bedside Disposition Plan: Pending further work-up  Consultants:   Orthopedics  Procedures: Plan for ORIF today Antimicrobials: None  Subjective: Pain controlled, no nausea or vomiting.  No chest pain dizziness  Objective: Vitals:   08/22/18 2211 08/23/18 0113 08/23/18 0115 08/23/18 0553  BP: 128/73 126/83  117/79  Pulse: 62 64  61  Resp: 16 19  19   Temp:  98.3 F (36.8 C)  97.8 F (36.6 C)  TempSrc:  Oral  Oral  SpO2: 98% 97%  99%  Weight:   123.9 kg   Height:   5\' 11"  (1.803 m)     Intake/Output Summary (Last 24  hours) at 08/23/2018 1152 Last data filed at 08/23/2018 0750 Gross per 24 hour  Intake 532.95 ml  Output 900 ml  Net -367.05 ml   Filed Weights   08/22/18 2034 08/23/18 0115  Weight: 122.5 kg 123.9 kg    Examination:  General exam: Appears calm and comfortable  Respiratory system: Clear to auscultation. Respiratory effort normal. Cardiovascular system: S1 & S2 heard, RRR. Gastrointestinal system: Abdomen is nondistended, soft and nontender. No organomegaly or masses felt. Normal bowel sounds heard. Central nervous system: Alert and oriented. No focal neurological deficits. Extremities: Left leg immobilized Skin: No rashes, lesions or ulcers Psychiatry:  Mood & affect appropriate.     Data Reviewed: I have personally reviewed following labs and imaging studies  CBC: Recent Labs  Lab 08/22/18 2153 08/23/18 0213  WBC 11.3* 8.0  NEUTROABS 9.2*  --   HGB 10.9* 10.5*  HCT 34.2* 32.7*  MCV 95.8 95.6  PLT 184 102   Basic Metabolic Panel: Recent Labs  Lab 08/22/18 2153 08/23/18 0213 08/23/18 0723 08/23/18 1057  NA 137 137  --   --   K 6.0* 5.5* 5.2* 6.2*  CL 110 112*  --   --   CO2 18* 19*  --   --   GLUCOSE 138* 148*  --   --   BUN 35* 38*  --   --   CREATININE 1.25* 1.27*  --   --   CALCIUM 8.8* 8.6*  --   --  GFR: Estimated Creatinine Clearance: 71.5 mL/min (A) (by C-G formula based on SCr of 1.27 mg/dL (H)). Liver Function Tests: Recent Labs  Lab 08/22/18 2153  AST 21  ALT 19  ALKPHOS 62  BILITOT 0.4  PROT 6.2*  ALBUMIN 3.6   No results for input(s): LIPASE, AMYLASE in the last 168 hours. No results for input(s): AMMONIA in the last 168 hours. Coagulation Profile: No results for input(s): INR, PROTIME in the last 168 hours. Cardiac Enzymes: No results for input(s): CKTOTAL, CKMB, CKMBINDEX, TROPONINI in the last 168 hours. BNP (last 3 results) No results for input(s): PROBNP in the last 8760 hours. HbA1C: No results for input(s): HGBA1C in the  last 72 hours. CBG: No results for input(s): GLUCAP in the last 168 hours. Lipid Profile: No results for input(s): CHOL, HDL, LDLCALC, TRIG, CHOLHDL, LDLDIRECT in the last 72 hours. Thyroid Function Tests: No results for input(s): TSH, T4TOTAL, FREET4, T3FREE, THYROIDAB in the last 72 hours. Anemia Panel: No results for input(s): VITAMINB12, FOLATE, FERRITIN, TIBC, IRON, RETICCTPCT in the last 72 hours. Sepsis Labs: No results for input(s): PROCALCITON, LATICACIDVEN in the last 168 hours.  Recent Results (from the past 240 hour(s))  SARS Coronavirus 2 (CEPHEID - Performed in [place] hospital lab), Hosp Order     Status: None   Collection Time: 08/22/18  9:53 PM   Specimen: Nasopharyngeal Swab  Result Value Ref Range Status   SARS Coronavirus 2 NEGATIVE NEGATIVE Final    Comment: (NOTE) If result is NEGATIVE SARS-CoV-2 target nucleic acids are NOT DETECTED. The SARS-CoV-2 RNA is generally detectable in upper and lower  respiratory specimens during the acute phase of infection. The lowest  concentration of SARS-CoV-2 viral copies this assay can detect is 250  copies / mL. A negative result does not preclude SARS-CoV-2 infection  and should not be used as the sole basis for treatment or other  patient management decisions.  A negative result may occur with  improper specimen collection / handling, submission of specimen other  than nasopharyngeal swab, presence of viral mutation(s) within the  areas targeted by this assay, and inadequate number of viral copies  (<250 copies / mL). A negative result must be combined with clinical  observations, patient history, and epidemiological information. If result is POSITIVE SARS-CoV-2 target nucleic acids are DETECTED. The SARS-CoV-2 RNA is generally detectable in upper and lower  respiratory specimens dur ing the acute phase of infection.  Positive  results are indicative of active infection with SARS-CoV-2.  Clinical  correlation with  patient history and other diagnostic information is  necessary to determine patient infection status.  Positive results do  not rule out bacterial infection or co-infection with other viruses. If result is PRESUMPTIVE POSTIVE SARS-CoV-2 nucleic acids MAY BE PRESENT.   A presumptive positive result was obtained on the submitted specimen  and confirmed on repeat testing.  While 2019 novel coronavirus  (SARS-CoV-2) nucleic acids may be present in the submitted sample  additional confirmatory testing may be necessary for epidemiological  and / or clinical management purposes  to differentiate between  SARS-CoV-2 and other Sarbecovirus currently known to infect humans.  If clinically indicated additional testing with an alternate test  methodology [phone]) is advised. The SARS-CoV-2 RNA is generally  detectable in upper and lower respiratory sp ecimens during the acute  phase of infection. The expected result is Negative. Fact Sheet for Patients:  StrictlyIdeas.no Fact Sheet for Healthcare Providers: BankingDealers.co.za This test is not yet approved or  cleared by the [place] and has been authorized for detection and/or diagnosis of SARS-CoV-2 by FDA under an Emergency Use Authorization (EUA).  This EUA will remain in effect (meaning this test can be used) for the duration of the COVID-19 declaration under Section 564(b)(1) of the Act, 21 U.S.C. section 360bbb-3(b)(1), unless the authorization is terminated or revoked sooner. Performed at [facility], [place] [address] [place], [place] [address]   Surgical PCR screen     Status: None   Collection Time: 08/23/18  2:03 AM   Specimen: Nasal Mucosa; Nasal Swab  Result Value Ref Range Status   MRSA, PCR NEGATIVE NEGATIVE Final   Staphylococcus aureus NEGATIVE NEGATIVE Final    Comment: (NOTE) The Xpert SA Assay (FDA approved for NASAL specimens in patients 77 years  of age and older), is one component of a comprehensive surveillance program. It is not intended to diagnose infection nor to guide or monitor treatment. Performed at [facility], [place] [address], [place], [place] [address]          Radiology Studies: Dg Chest Port 1 View  Result Date: 08/22/2018 CLINICAL DATA:  Preop for femur fracture EXAM: PORTABLE CHEST 1 VIEW COMPARISON:  None. FINDINGS: The heart size and mediastinal contours are within normal limits. Both lungs are clear. The visualized skeletal structures are unremarkable. IMPRESSION: No acute cardiopulmonary process. Electronically Signed   By: Prudencio Pair M.D.   On: 08/22/2018 23:40   Dg Femur Min 2 Views Left  Result Date: 08/22/2018 CLINICAL DATA:  Patient tripped and fell EXAM: LEFT FEMUR 2 VIEWS COMPARISON:  None. FINDINGS: There is obliquely oriented comminuted fracture of the midshaft of the femur with medial angulation and overlap of fracture fragments. The fracture line extends to the distal femur, however the total knee arthroplasty appears to be intact. There is overlying soft tissue swelling. IMPRESSION: Comminuted obliquely oriented fracture seen through the mid to distal femur. Left total knee arthroplasty which appears to be intact. Electronically Signed   By: Prudencio Pair M.D.   On: 08/22/2018 22:42        Scheduled Meds:  amLODipine  5 mg Oral Daily   divalproex  250 mg Oral BID   feeding supplement  237 mL Oral BID BM   multivitamin with minerals  1 tablet Oral Daily   povidone-iodine  2 application Topical Once   Continuous Infusions:   ceFAZolin (ANCEF) IV     methocarbamol (ROBAXIN) IV     tranexamic acid       LOS: 1 day    Time spent: Ada, MD [facility] Pager [phone]   If 7PM-7AM, please contact night-coverage [email or web address] Password [facility] 08/23/2018, 11:52 AM

## 2018-08-23 NOTE — Transfer of Care (Signed)
Immediate Anesthesia Transfer of Care Note  Patient: Joshua Stark  Procedure(s) Performed: INTRAMEDULLARY (IM) RETROGRADE FEMORAL NAILING (Left )  Patient Location: PACU  Anesthesia Type:Spinal  Level of Consciousness: alert   Airway & Oxygen Therapy: Patient Spontanous Breathing and Patient connected to face mask oxygen  Post-op Assessment: Report given to RN and Post -op Vital signs reviewed and stable  Post vital signs: Reviewed and stable  Last Vitals:  Vitals Value Taken Time  BP    Temp    Pulse    Resp    SpO2      Last Pain:  Vitals:   08/23/18 1653  TempSrc:   PainSc: 0-No pain      Patients Stated Pain Goal: 4 (50/51/83 3582)  Complications: No apparent anesthesia complications

## 2018-08-23 NOTE — Interval H&P Note (Signed)
History and Physical Interval Note:  08/23/2018 5:25 PM  Joshua Stark  has presented today for surgery, with the diagnosis of Left peri-prostetic femur fracture.  The various methods of treatment have been discussed with the patient and family. After consideration of risks, benefits and other options for treatment, the patient has consented to  Procedure(s): INTRAMEDULLARY (IM) RETROGRADE FEMORAL NAILING (Left) as a surgical intervention.  The patient's history has been reviewed, patient examined, no change in status, stable for surgery.  I have reviewed the patient's chart and labs.  Questions were answered to the patient's satisfaction.     Mauri Pole

## 2018-08-23 NOTE — Consult Note (Signed)
Reason for Consult:  Left periprosthetic distal femur fracture Referring Physician: ED Physician  Joshua Stark is an 73 y.o. male.  HPI: Joshua Stark is a 73 y.o. male who presented to the emergency department with left knee pain after a fall at home.  Patient reports he has been in his usual state of health and was having an uneventful day when he was working on something in his garage and tripped over a tool.  He reports feeling as though he twisted the left knee and fell to the ground, but did not hit his head or lose consciousness.  He immediately experienced severe pain involving the left thigh and knee and was unable to stand.  Dr. Alvan Dame discussed the patient's current circumstance and the need for a procedure to fix the left femur.  Dr. Alvan Dame discussed the risk and benefits and expectations and the patient does wish to proceed with surgery.  Surgery will be scheduled for later today for an ORIF of the left periprosthetic femur fracture per Dr. Alvan Dame.  Risks included but not limited to the risk of anesthesia, blood clots, nerve damage, blood vessel damage, failure of the prosthesis, infection up to including death.   Past Medical History:  Diagnosis Date  . Arthritis   . CAD S/P percutaneous coronary angioplasty 1999   PCI- prox LAD @ D1, SP1 trifurcation.  3.0 mm x 16 mm AVE-GFX BMS  . History of nuclear stress test August 2012   Treadmill Myoview: 9 minutes, 10 METS --> hypertensive response.  No evidence of ischemia or infarction.  EF 64%  . HLD (hyperlipidemia)   . Hypertension   . Obesity (BMI 30-39.9)   . Seizure disorder ([place])   . Seizures ([place])    hx of years ago     Past Surgical History:  Procedure Laterality Date  . CARDIAC CATHETERIZATION  01/20/1998  . CORONARY ANGIOPLASTY  01/20/1998   PCI to LAD ,prox LAD at trifurcation for D1and SP1.3.0-x16-mm AVE-GFX bare-metal stent  . NM MYOCAR PERF WALL MOTION  Aug 2 ,2012   treadmill myoview exercised 9 minutes, reacing 10  metaboblic equivlents,had ahypertensive reponse .EF64%  . TOTAL KNEE ARTHROPLASTY Left 08/17/2016   Procedure: LEFT TOTAL KNEE ARTHROPLASTY;  Surgeon: Paralee Cancel, MD;  Location: [facility];  Service: Orthopedics;  Laterality: Left;  . TRANSTHORACIC ECHOCARDIOGRAM  09/20/2013   normal LV size and function. EF 60-65%. Normal diastolic function. Mild LA dilation and mild AI.  No evidence of aortic stenosis. No significant MR.    Family History  Problem Relation Age of Onset  . Hypertension Mother   . Cancer Father        lung and prostate    Social History:  reports that he has never smoked. He has never used smokeless tobacco. He reports that he does not drink alcohol or use drugs.  Allergies:  Allergies  Allergen Reactions  . Fenofibrate Other (See Comments)    Arthritis, cramping   . Rosuvastatin Other (See Comments)    REACTION: cramps   . Pravastatin Sodium Other (See Comments)    REACTION: cramps  . Statins Other (See Comments)    Cramping      Results for orders placed or performed during the hospital encounter of 08/22/18 (from the past 48 hour(s))  CBC with Differential     Status: Abnormal   Collection Time: 08/22/18  9:53 PM  Result Value Ref Range   WBC 11.3 (H) 4.0 - 10.5 K/uL   RBC  3.57 (L) 4.22 - 5.81 MIL/uL   Hemoglobin 10.9 (L) 13.0 - 17.0 g/dL   HCT 34.2 (L) 39.0 - 52.0 %   MCV 95.8 80.0 - 100.0 fL   MCH 30.5 26.0 - 34.0 pg   MCHC 31.9 30.0 - 36.0 g/dL   RDW 13.0 11.5 - 15.5 %   Platelets 184 150 - 400 K/uL   nRBC 0.0 0.0 - 0.2 %   Neutrophils Relative % 81 %   Neutro Abs 9.2 (H) 1.7 - 7.7 K/uL   Lymphocytes Relative 11 %   Lymphs Abs 1.2 0.7 - 4.0 K/uL   Monocytes Relative 7 %   Monocytes Absolute 0.8 0.1 - 1.0 K/uL   Eosinophils Relative 0 %   Eosinophils Absolute 0.0 0.0 - 0.5 K/uL   Basophils Relative 0 %   Basophils Absolute 0.0 0.0 - 0.1 K/uL   Immature Granulocytes 1 %   Abs Immature Granulocytes 0.06 0.00 - 0.07 K/uL    Comment: Performed  at [facility], [place] [address], [place], [place] [address]  Comprehensive metabolic panel     Status: Abnormal   Collection Time: 08/22/18  9:53 PM  Result Value Ref Range   Sodium 137 135 - 145 mmol/L   Potassium 6.0 (H) 3.5 - 5.1 mmol/L   Chloride 110 98 - 111 mmol/L   CO2 18 (L) 22 - 32 mmol/L   Glucose, Bld 138 (H) 70 - 99 mg/dL   BUN 35 (H) 8 - 23 mg/dL   Creatinine, Ser 1.25 (H) 0.61 - 1.24 mg/dL   Calcium 8.8 (L) 8.9 - 10.3 mg/dL   Total Protein 6.2 (L) 6.5 - 8.1 g/dL   Albumin 3.6 3.5 - 5.0 g/dL   AST 21 15 - 41 U/L   ALT 19 0 - 44 U/L   Alkaline Phosphatase 62 38 - 126 U/L   Total Bilirubin 0.4 0.3 - 1.2 mg/dL   GFR calc non Af Amer 58 (L) >60 mL/min   GFR calc Af Amer >60 >60 mL/min   Anion gap 9 5 - 15    Comment: Performed at [facility], [place] [address], [place], [place] [address]  SARS Coronavirus 2 (CEPHEID - Performed in [place] hospital lab), Hosp Order     Status: None   Collection Time: 08/22/18  9:53 PM   Specimen: Nasopharyngeal Swab  Result Value Ref Range   SARS Coronavirus 2 NEGATIVE NEGATIVE    Comment: (NOTE) If result is NEGATIVE SARS-CoV-2 target nucleic acids are NOT DETECTED. The SARS-CoV-2 RNA is generally detectable in upper and lower  respiratory specimens during the acute phase of infection. The lowest  concentration of SARS-CoV-2 viral copies this assay can detect is 250  copies / mL. A negative result does not preclude SARS-CoV-2 infection  and should not be used as the sole basis for treatment or other  patient management decisions.  A negative result may occur with  improper specimen collection / handling, submission of specimen other  than nasopharyngeal swab, presence of viral mutation(s) within the  areas targeted by this assay, and inadequate number of viral copies  (<250 copies / mL). A negative result must be combined with clinical  observations, patient history, and epidemiological  information. If result is POSITIVE SARS-CoV-2 target nucleic acids are DETECTED. The SARS-CoV-2 RNA is generally detectable in upper and lower  respiratory specimens dur ing the acute phase of infection.  Positive  results are indicative of active infection with SARS-CoV-2.  Clinical  correlation with patient history and other diagnostic information is  necessary to determine patient infection status.  Positive results do  not rule out bacterial infection or co-infection with other viruses. If result is PRESUMPTIVE POSTIVE SARS-CoV-2 nucleic acids MAY BE PRESENT.   A presumptive positive result was obtained on the submitted specimen  and confirmed on repeat testing.  While 2019 novel coronavirus  (SARS-CoV-2) nucleic acids may be present in the submitted sample  additional confirmatory testing may be necessary for epidemiological  and / or clinical management purposes  to differentiate between  SARS-CoV-2 and other Sarbecovirus currently known to infect humans.  If clinically indicated additional testing with an alternate test  methodology [phone]) is advised. The SARS-CoV-2 RNA is generally  detectable in upper and lower respiratory sp ecimens during the acute  phase of infection. The expected result is Negative. Fact Sheet for Patients:  StrictlyIdeas.no Fact Sheet for Healthcare Providers: BankingDealers.co.za This test is not yet approved or cleared by the [place] FDA and has been authorized for detection and/or diagnosis of SARS-CoV-2 by FDA under an Emergency Use Authorization (EUA).  This EUA will remain in effect (meaning this test can be used) for the duration of the COVID-19 declaration under Section 564(b)(1) of the Act, 21 U.S.C. section 360bbb-3(b)(1), unless the authorization is terminated or revoked sooner. Performed at [facility], [place] [address] [place], [place] [address]   Na and K (sodium  & potassium), rand urine     Status: None   Collection Time: 08/22/18 11:18 PM  Result Value Ref Range   Sodium, Ur 115 mmol/L   Potassium Urine 65 mmol/L    Comment: Performed at [facility], [place] [address] [place], [place] [address]  Type and screen [place]     Status: None   Collection Time: 08/23/18  2:00 AM  Result Value Ref Range   ABO/RH(D) A POS    Antibody Screen NEG    Sample Expiration      08/26/2018,2359 Performed at [facility], [place] [address] [place], [place] [address]   Surgical PCR screen     Status: None   Collection Time: 08/23/18  2:03 AM   Specimen: Nasal Mucosa; Nasal Swab  Result Value Ref Range   MRSA, PCR NEGATIVE NEGATIVE   Staphylococcus aureus NEGATIVE NEGATIVE    Comment: (NOTE) The Xpert SA Assay (FDA approved for NASAL specimens in patients 44 years of age and older), is one component of a comprehensive surveillance program. It is not intended to diagnose infection nor to guide or monitor treatment. Performed at [facility], [place] [address], [place],  [address]   Basic metabolic panel     Status: Abnormal   Collection Time: 08/23/18  2:13 AM  Result Value Ref Range   Sodium 137 135 - 145 mmol/L   Potassium 5.5 (H) 3.5 - 5.1 mmol/L   Chloride 112 (H) 98 - 111 mmol/L   CO2 19 (L) 22 - 32 mmol/L   Glucose, Bld 148 (H) 70 - 99 mg/dL   BUN 38 (H) 8 - 23 mg/dL   Creatinine, Ser 1.27 (H) 0.61 - 1.24 mg/dL   Calcium 8.6 (L) 8.9 - 10.3 mg/dL   GFR calc non Af Amer 56 (L) >60 mL/min   GFR calc Af Amer >60 >60 mL/min   Anion gap 6 5 - 15    Comment: Performed at [facility], [place]  [address] [place], [place] [address]  CBC     Status: Abnormal   Collection Time: 08/23/18  2:13 AM  Result Value Ref Range   WBC 8.0 4.0 - 10.5 K/uL   RBC 3.42 (L) 4.22 - 5.81 MIL/uL   Hemoglobin 10.5 (L) 13.0 - 17.0 g/dL   HCT 32.7 (L) 39.0 - 52.0 %   MCV  95.6 80.0 - 100.0 fL   MCH 30.7 26.0 - 34.0 pg   MCHC 32.1 30.0 - 36.0 g/dL   RDW 13.0 11.5 - 15.5 %   Platelets 159 150 - 400 K/uL   nRBC 0.0 0.0 - 0.2 %    Comment: Performed at [facility], [place] [address] [place], [place] [address]  Potassium     Status: Abnormal   Collection Time: 08/23/18  7:23 AM  Result Value Ref Range   Potassium 5.2 (H) 3.5 - 5.1 mmol/L    Comment: Performed at [facility], [place] [address] [place], [place] [address]    Dg Chest Port 1 View  Result Date: 08/22/2018 CLINICAL DATA:  Preop for femur fracture EXAM: PORTABLE CHEST 1 VIEW COMPARISON:  None. FINDINGS: The heart size and mediastinal contours are within normal limits. Both lungs are clear. The visualized skeletal structures are unremarkable. IMPRESSION: No acute cardiopulmonary process. Electronically Signed   By: Prudencio Pair M.D.   On: 08/22/2018 23:40   Dg Femur Min 2 Views Left  Result Date: 08/22/2018 CLINICAL DATA:  Patient tripped and fell EXAM: LEFT FEMUR 2 VIEWS COMPARISON:  None. FINDINGS: There is obliquely oriented comminuted fracture of the midshaft of the femur with medial angulation and overlap of fracture fragments. The fracture line extends to the distal femur, however the total knee arthroplasty appears to be intact. There is overlying soft tissue swelling. IMPRESSION: Comminuted obliquely oriented fracture seen through the mid to distal femur. Left total knee arthroplasty which appears to be intact. Electronically Signed   By: Prudencio Pair M.D.   On: 08/22/2018 22:42    Review of Systems  Constitutional: Negative.   HENT: Negative.   Eyes: Negative.   Respiratory: Negative.   Cardiovascular: Negative.   Gastrointestinal: Negative.   Genitourinary: Negative.   Musculoskeletal: Positive for joint pain.  Skin: Negative.   Neurological: Negative.   Endo/Heme/Allergies: Negative.   Psychiatric/Behavioral: Negative.    Blood pressure 117/79,  pulse 61, temperature 97.8 F (36.6 C), temperature source Oral, resp. rate 19, height 5\' 11"  (1.803 m), weight 123.9 kg, SpO2 99 %. Physical Exam  Constitutional: He is oriented to person, place, and time. He appears well-developed.  HENT:  Head: Normocephalic.  Eyes: Pupils are equal, round, and reactive to light.  Neck: Neck supple. No JVD present. No tracheal deviation present. No thyromegaly present.  Cardiovascular: Normal rate, regular rhythm and intact distal pulses.  Respiratory: Effort normal and breath sounds normal. No respiratory distress. He has no wheezes.  GI: Soft. There is no abdominal tenderness. There is no guarding.  Musculoskeletal:     Left knee: He exhibits decreased range of motion, swelling, deformity and bony tenderness. He exhibits no erythema. Tenderness found.     Left upper leg: He exhibits tenderness, bony tenderness, swelling and deformity.  Lymphadenopathy:    He has no cervical adenopathy.  Neurological: He is alert and oriented to person, place, and time.  Skin: Skin is warm and dry.  Psychiatric: He has a normal mood and affect.    Assessment/Plan: Left distal femur periprosthetic fracture  Patient n.p.o. now and will continue as surgery is planned for later today.  Surgery plan is for an ORIF of the distal left femur periprosthetic fracture per Dr. Alvan Dame.  Risks, benefits and expectations of procedure were discussed with the patient, patient understands and wishes to proceed with surgery.  Risks, benefits and expectations were discussed with the patient.  Risks including but not limited to the risk of anesthesia, blood clots, nerve damage, blood vessel damage, failure of the prosthesis, infection and up to and including death.  Patient understand the risks, benefits and expectations and wishes to proceed with surgery.   Lucille Passy [facility] 08/23/2018, 9:27 AM

## 2018-08-23 NOTE — Progress Notes (Signed)
Orthopedic Tech Progress Note Patient Details:  Joshua Stark [birth year] [id number]  Ortho Devices Type of Ortho Device: Post (long leg) splint Ortho Device/Splint Location: lle Ortho Device/Splint Interventions: Ordered, Application, Adjustment   Post Interventions Patient Tolerated: Well Instructions Provided: Care of device, Adjustment of device   [name] 08/23/2018, 12:52 AM

## 2018-08-23 NOTE — Progress Notes (Signed)
Initial Nutrition Assessment  DOCUMENTATION CODES:   Obesity unspecified  INTERVENTION:  - diet advancement as medically feasible following surgery.  - will order Ensure Surgery BID, each supplement provides 330 kcal and 18 grams protein. - will order daily multivitamin with minerals.    NUTRITION DIAGNOSIS:   Increased nutrient needs related to post-op healing as evidenced by estimated needs.  GOAL:   Patient will meet greater than or equal to 90% of their needs  MONITOR:   PO intake, Supplement acceptance, Labs, Weight trends, Skin  REASON FOR ASSESSMENT:   Consult Hip fracture protocol  ASSESSMENT:   73 y.o. male with medical history significant for CAD, HTN, seizure disorder, and CKD stage 3. He presented to the ED with L knee pain after a fall at home. He reports being in his usual state of health and then he tripped over a tool while fixing something in his garage. He felt as though he twisted his L knee, he then fell to the ground and experienced severe pain involving his L thigh and knee, and he was unable to get up.  Patient has been NPO since admission yesterday. He denies any changes in eating habits/appetite PTA. No chewing or swallowing difficulties at baseline. No abdominal pain/pressure or nausea with PO intakes PTA. Patient denies any recent weight changes. Per chart review, current weight is 273 lb and weight on 6/9 was 268 lb. Prior to this, no weight hx since 2018. He was provided with 1 carton of Ensure Pre-surgery this AM.  Per notes: - L femur fx with plan for ORIF later today - hyperkalemia    Labs reviewed; K: 5.2 mmol/l, Cl: 112 mmol/l, BUN: 38 mg/dl, creatinine: 1.27 mg/dl, Ca: 8.6 mg/dl, GFR: 56 ml/min.  Medications reviewed; 40 mg IV lasix x1 dose 7/28.      NUTRITION - FOCUSED PHYSICAL EXAM:  completed; no muscle or fat wasting, no edema.   Diet Order:   Diet Order            Diet NPO time specified Except for: Ice Chips, Sips with Meds   Diet effective midnight              EDUCATION NEEDS:   No education needs have been identified at this time  Skin:  Skin Assessment: Reviewed RN Assessment  Last BM:  7/28  Height:   Ht Readings from Last 1 Encounters:  08/23/18 5\' 11"  (1.803 m)    Weight:   Wt Readings from Last 1 Encounters:  08/23/18 123.9 kg    Ideal Body Weight:  78.2 kg  BMI:  Body mass index is 38.1 kg/m.  Estimated Nutritional Needs:   Kcal:  1850-2045 kcal  Protein:  90-100 grams  Fluid:  >/= 2 L/day     Jarome Matin, MS, RD, LDN, [facility] Inpatient Clinical Dietitian Pager # [phone] After hours/weekend pager # [phone]

## 2018-08-23 NOTE — Anesthesia Procedure Notes (Signed)
Procedure Name: MAC Date/Time: 08/23/2018 5:50 PM Performed by: Claudia Desanctis, CRNA Patient Re-evaluated:Patient Re-evaluated prior to induction Oxygen Delivery Method: Simple face mask

## 2018-08-23 NOTE — Anesthesia Procedure Notes (Signed)
Spinal  Patient location during procedure: OR Start time: 08/23/2018 5:29 PM End time: 08/23/2018 5:39 PM Staffing Anesthesiologist: Duane Boston, MD Performed: anesthesiologist  Preanesthetic Checklist Completed: patient identified, surgical consent, pre-op evaluation, timeout performed, IV checked, risks and benefits discussed and monitors and equipment checked Spinal Block Patient position: left lateral decubitus Prep: DuraPrep Patient monitoring: cardiac monitor, continuous pulse ox and blood pressure Approach: left paramedian Location: L2-3 Injection technique: single-shot Needle Needle type: Quincke  Needle gauge: 22 G Needle length: 9 cm Additional Notes Functioning IV was confirmed and monitors were applied. Sterile prep and drape, including hand hygiene and sterile gloves were used. The patient was positioned and the spine was prepped. The skin was anesthetized with lidocaine.  Free flow of clear CSF was obtained prior to injecting local anesthetic into the CSF.  The spinal needle aspirated freely following injection.  The needle was carefully withdrawn.  The patient tolerated the procedure well.

## 2018-08-23 NOTE — Brief Op Note (Signed)
08/22/2018 - 08/23/2018  8:15 PM  PATIENT:  Joshua Stark  73 y.o. male  PRE-OPERATIVE DIAGNOSIS:  Left distal femur peri-prosthetic fracture  POST-OPERATIVE DIAGNOSIS: Left distal femur peri-prosthetic fracture  PROCEDURE:  Procedure(s): INTRAMEDULLARY (IM) RETROGRADE FEMORAL NAILING (Left)  SURGEON:  Surgeon(s) and Role:    Paralee Cancel, MD - Primary  PHYSICIAN ASSISTANT: Griffith Citron, PA-C  ANESTHESIA:   spinal  EBL:  500 mL   BLOOD ADMINISTERED:600 CC PRBC  DRAINS: none   LOCAL MEDICATIONS USED:  NONE  SPECIMEN:  No Specimen  DISPOSITION OF SPECIMEN:  N/A  COUNTS:  YES  TOURNIQUET:  * No tourniquets in log *  DICTATION: .Other Dictation: Dictation Number [id number]  PLAN OF CARE: Admit to inpatient   PATIENT DISPOSITION:  PACU - hemodynamically stable.   Delay start of Pharmacological VTE agent (>24hrs) due to surgical blood loss or risk of bleeding: no

## 2018-08-24 ENCOUNTER — Encounter (HOSPITAL_COMMUNITY): Payer: Self-pay | Admitting: Orthopedic Surgery

## 2018-08-24 ENCOUNTER — Inpatient Hospital Stay (HOSPITAL_COMMUNITY): Payer: Medicare HMO

## 2018-08-24 ENCOUNTER — Other Ambulatory Visit: Payer: Self-pay | Admitting: Family Medicine

## 2018-08-24 LAB — BASIC METABOLIC PANEL
Anion gap: 8 (ref 5–15)
BUN: 49 mg/dL — ABNORMAL HIGH (ref 8–23)
CO2: 19 mmol/L — ABNORMAL LOW (ref 22–32)
Calcium: 8.4 mg/dL — ABNORMAL LOW (ref 8.9–10.3)
Chloride: 110 mmol/L (ref 98–111)
Creatinine, Ser: 1.98 mg/dL — ABNORMAL HIGH (ref 0.61–1.24)
GFR calc Af Amer: 38 mL/min — ABNORMAL LOW (ref >60)
GFR calc non Af Amer: 33 mL/min — ABNORMAL LOW (ref >60)
Glucose, Bld: 178 mg/dL — ABNORMAL HIGH (ref 70–99)
Potassium: 6 mmol/L — ABNORMAL HIGH (ref 3.5–5.1)
Sodium: 137 mmol/L (ref 135–145)

## 2018-08-24 LAB — URINALYSIS, ROUTINE W REFLEX MICROSCOPIC
Bilirubin Urine: NEGATIVE
Glucose, UA: NEGATIVE mg/dL
Ketones, ur: NEGATIVE mg/dL
Leukocytes,Ua: NEGATIVE
Nitrite: NEGATIVE
Protein, ur: NEGATIVE mg/dL
Specific Gravity, Urine: 1.017 (ref 1.005–1.030)
pH: 5 (ref 5.0–8.0)

## 2018-08-24 LAB — POTASSIUM
Potassium: 5.5 mmol/L — ABNORMAL HIGH (ref 3.5–5.1)
Potassium: 5.6 mmol/L — ABNORMAL HIGH (ref 3.5–5.1)

## 2018-08-24 LAB — CBC
HCT: 28.8 % — ABNORMAL LOW (ref 39.0–52.0)
Hemoglobin: 8.8 g/dL — ABNORMAL LOW (ref 13.0–17.0)
MCH: 29.6 pg (ref 26.0–34.0)
MCHC: 30.6 g/dL (ref 30.0–36.0)
MCV: 97 fL (ref 80.0–100.0)
Platelets: 110 10*3/uL — ABNORMAL LOW (ref 150–400)
RBC: 2.97 MIL/uL — ABNORMAL LOW (ref 4.22–5.81)
RDW: 14.1 % (ref 11.5–15.5)
WBC: 7.4 10*3/uL (ref 4.0–10.5)
nRBC: 0 % (ref 0.0–0.2)

## 2018-08-24 LAB — CK: Total CK: 798 U/L — ABNORMAL HIGH (ref 49–397)

## 2018-08-24 MED ORDER — SODIUM BICARBONATE 650 MG PO TABS
1300.0000 mg | ORAL_TABLET | Freq: Once | ORAL | Status: AC
  Administered 2018-08-24: 1300 mg via ORAL
  Filled 2018-08-24: qty 2

## 2018-08-24 MED ORDER — SODIUM ZIRCONIUM CYCLOSILICATE 10 G PO PACK
10.0000 g | PACK | Freq: Two times a day (BID) | ORAL | Status: DC
  Administered 2018-08-24 – 2018-08-25 (×2): 10 g via ORAL
  Filled 2018-08-24 (×3): qty 1

## 2018-08-24 MED ORDER — OXYCODONE-ACETAMINOPHEN 5-325 MG PO TABS
1.0000 | ORAL_TABLET | ORAL | Status: DC | PRN
  Administered 2018-08-24: 1 via ORAL
  Administered 2018-08-24: 2 via ORAL
  Administered 2018-08-24 (×2): 1 via ORAL
  Administered 2018-08-25 (×3): 2 via ORAL
  Administered 2018-08-26 (×3): 1 via ORAL
  Filled 2018-08-24 (×2): qty 1
  Filled 2018-08-24: qty 2
  Filled 2018-08-24 (×2): qty 1
  Filled 2018-08-24: qty 2
  Filled 2018-08-24 (×2): qty 1
  Filled 2018-08-24: qty 2
  Filled 2018-08-24 (×2): qty 1

## 2018-08-24 MED ORDER — TRAMADOL HCL 50 MG PO TABS: 50.0000 mg | ORAL_TABLET | Freq: Four times a day (QID) | ORAL | Status: DC | PRN

## 2018-08-24 NOTE — Telephone Encounter (Signed)
Last office visit 07/04/2018 for CPE.  Last refilled 05/30/2018 for #180 with no refills.  Last Depakote level 06/28/2018 with was low at 35.8 mg/L.  CPE scheduled for 07/05/2019.  Currently in hospital.

## 2018-08-24 NOTE — Evaluation (Signed)
Physical Therapy Evaluation Patient Details Name: Joshua Stark MRN: [id number] DOB: 12-08-1945 Today's Date: 08/24/2018   History of Present Illness  Pt is a 73 year old male with PMHx significant for L TKA 2018, HTN, CAD, seizures and admitted after sustaining fracture after fall at home.  Pt currently s/p Open reduction internal fixation left distal periprosthetic femur fracture.  Clinical Impression  Patient is s/p above surgery resulting in functional limitations due to the deficits listed below (see PT Problem List).  Patient will benefit from skilled PT to increase their independence and safety with mobility to allow discharge to the venue listed below.  Pt assisted with ambulating short distance in hallway and reports he plans to d/c back home. Family can assist upon d/c and son is currently installing a ramp to home.       Follow Up Recommendations Home health PT;Supervision/Assistance - 24 hour    Equipment Recommendations  None recommended by PT    Recommendations for Other Services       Precautions / Restrictions Precautions Precautions: Fall Restrictions Weight Bearing Restrictions: Yes LLE Weight Bearing: Partial weight bearing LLE Partial Weight Bearing Percentage or Pounds: 50%      Mobility  Bed Mobility Overal bed mobility: Needs Assistance Bed Mobility: Supine to Sit     Supine to sit: Mod assist     General bed mobility comments: assist for L LE over EOB and trunk upright, pt utilized bed rails  Transfers Overall transfer level: Needs assistance Equipment used: Rolling walker (2 wheeled) Transfers: Sit to/from Stand Sit to Stand: Min assist;From elevated surface         General transfer comment: assist to rise and steady, cues for UE and LE positioning, assist to control descent  Ambulation/Gait Ambulation/Gait assistance: Min assist Gait Distance (Feet): 40 Feet Assistive device: Rolling walker (2 wheeled) Gait Pattern/deviations: Step-to  pattern;Decreased stance time - left;Decreased weight shift to left;Antalgic Gait velocity: decr   General Gait Details: verbal cues for sequence, PWB status, RW positioning, step length  Stairs            Wheelchair Mobility    Modified Rankin (Stroke Patients Only)       Balance                                             Pertinent Vitals/Pain Pain Assessment: 0-10 Pain Score: 4  Pain Location: L thigh, knee Pain Descriptors / Indicators: Sore Pain Intervention(s): Limited activity within patient's tolerance;Repositioned;Premedicated before session    Home Living Family/patient expects to be discharged to:: Private residence Living Arrangements: Alone Available Help at Discharge: Family Type of Home: House Home Access: Stairs to enter Entrance Stairs-Rails: Psychiatric nurse of Steps: 3   Home Equipment: Environmental consultant - 2 wheels;Cane - single point Additional Comments: son is putting up ramp while pt is admitted    Prior Function Level of Independence: Independent               Hand Dominance        Extremity/Trunk Assessment        Lower Extremity Assessment Lower Extremity Assessment: LLE deficits/detail LLE Deficits / Details: able to perform ankle pumps, required assist for movement due to pain       Communication   Communication: No difficulties  Cognition Arousal/Alertness: Awake/alert Behavior During Therapy: WFL for tasks assessed/performed Overall  Cognitive Status: Within Functional Limits for tasks assessed                                        General Comments      Exercises     Assessment/Plan    PT Assessment Patient needs continued PT services  PT Problem List Decreased strength;Decreased mobility;Decreased activity tolerance;Decreased knowledge of use of DME;Pain;Decreased knowledge of precautions       PT Treatment Interventions DME instruction;Gait training;Balance  training;Therapeutic exercise;Functional mobility training;Therapeutic activities;Stair training;Patient/family education    PT Goals (Current goals can be found in the Care Plan section)  Acute Rehab PT Goals PT Goal Formulation: With patient Time For Goal Achievement: 09/07/18 Potential to Achieve Goals: Good    Frequency Min 6X/week   Barriers to discharge        Co-evaluation               AM-PAC PT "6 Clicks" Mobility  Outcome Measure Help needed turning from your back to your side while in a flat bed without using bedrails?: A Little Help needed moving from lying on your back to sitting on the side of a flat bed without using bedrails?: A Little Help needed moving to and from a bed to a chair (including a wheelchair)?: A Little Help needed standing up from a chair using your arms (e.g., wheelchair or bedside chair)?: A Little Help needed to walk in hospital room?: A Little Help needed climbing 3-5 steps with a railing? : A Lot 6 Click Score: 17    End of Session Equipment Utilized During Treatment: Gait belt Activity Tolerance: Patient tolerated treatment well Patient left: in chair;with chair alarm set;with call bell/phone within reach Nurse Communication: Mobility status PT Visit Diagnosis: Other abnormalities of gait and mobility (R26.89)    Time: 6811-5726 PT Time Calculation (min) (ACUTE ONLY): 24 min   Carmelia Bake, PT, DPT Acute Rehabilitation Services Office: [phone] Pager: [phone]  Trena Platt 08/24/2018, 12:16 PM

## 2018-08-24 NOTE — Anesthesia Postprocedure Evaluation (Signed)
Anesthesia Post Note  Patient: Joshua Stark  Procedure(s) Performed: INTRAMEDULLARY (IM) RETROGRADE FEMORAL NAILING (Left )     Patient location during evaluation: PACU Anesthesia Type: Spinal Level of consciousness: awake and alert Pain management: pain level controlled Vital Signs Assessment: post-procedure vital signs reviewed and stable Respiratory status: spontaneous breathing and respiratory function stable Cardiovascular status: blood pressure returned to baseline and stable Postop Assessment: spinal receding Anesthetic complications: no                  Djuna Frechette DANIEL

## 2018-08-24 NOTE — Progress Notes (Signed)
Physical Therapy Treatment Patient Details Name: Joshua Stark MRN: [id number] DOB: 1945-09-18 Today's Date: 08/24/2018    History of Present Illness Pt is a 73 year old male with PMHx significant for L TKA 2018, HTN, CAD, seizures and admitted after sustaining fracture after fall at home.  Pt currently s/p Open reduction internal fixation left distal periprosthetic femur fracture.    PT Comments    Pt ambulated in hallway again this afternoon and tolerated improved distance.  Pt left EOB with nurse tech for bathing.     Follow Up Recommendations  Home health PT;Supervision/Assistance - 24 hour     Equipment Recommendations  None recommended by PT    Recommendations for Other Services       Precautions / Restrictions Precautions Precautions: Fall Restrictions LLE Weight Bearing: Partial weight bearing LLE Partial Weight Bearing Percentage or Pounds: 50%    Mobility  Bed Mobility Overal bed mobility: Needs Assistance Bed Mobility: Supine to Sit     Supine to sit: Mod assist     General bed mobility comments: pt left sitting EOB with nurse tech for bathing  Transfers Overall transfer level: Needs assistance Equipment used: Rolling walker (2 wheeled) Transfers: Sit to/from Stand Sit to Stand: Min guard         General transfer comment: verbal cues for UE and LE positioning  Ambulation/Gait Ambulation/Gait assistance: Min guard Gait Distance (Feet): 140 Feet Assistive device: Rolling walker (2 wheeled) Gait Pattern/deviations: Step-to pattern;Decreased stance time - left;Decreased weight shift to left;Antalgic Gait velocity: decr   General Gait Details: verbal cues for sequence, PWB status, RW positioning, step length   Stairs             Wheelchair Mobility    Modified Rankin (Stroke Patients Only)       Balance                                            Cognition Arousal/Alertness: Awake/alert Behavior During Therapy:  WFL for tasks assessed/performed Overall Cognitive Status: Within Functional Limits for tasks assessed                                        Exercises      General Comments        Pertinent Vitals/Pain Pain Assessment: 0-10 Pain Score: 3  Pain Location: L thigh, knee Pain Descriptors / Indicators: Sore Pain Intervention(s): Repositioned;Premedicated before session;Monitored during session    Home Living                      Prior Function            PT Goals (current goals can now be found in the care plan section) Acute Rehab PT Goals PT Goal Formulation: With patient Time For Goal Achievement: 09/07/18 Potential to Achieve Goals: Good Progress towards PT goals: Progressing toward goals    Frequency    Min 6X/week      PT Plan Current plan remains appropriate    Co-evaluation              AM-PAC PT "6 Clicks" Mobility   Outcome Measure  Help needed turning from your back to your side while in a flat bed without using bedrails?: A Little Help needed  moving from lying on your back to sitting on the side of a flat bed without using bedrails?: A Little Help needed moving to and from a bed to a chair (including a wheelchair)?: A Little Help needed standing up from a chair using your arms (e.g., wheelchair or bedside chair)?: A Little Help needed to walk in hospital room?: A Little Help needed climbing 3-5 steps with a railing? : A Lot 6 Click Score: 17    End of Session Equipment Utilized During Treatment: Gait belt Activity Tolerance: Patient tolerated treatment well Patient left: with call bell/phone within reach;in bed;with nursing/sitter in room Nurse Communication: Mobility status PT Visit Diagnosis: Other abnormalities of gait and mobility (R26.89)     Time: 5284-1324 PT Time Calculation (min) (ACUTE ONLY): 14 min  Charges:  $Gait Training: 8-22 mins                     Carmelia Bake, PT, DPT Acute Rehabilitation  Services Office: [phone] Pager: Herrick E 08/24/2018, 4:08 PM

## 2018-08-24 NOTE — Progress Notes (Signed)
PROGRESS NOTE    Joshua Stark  DDU:[id number] DOB: 05-06-1945 DOA: 08/22/2018 PCP: Jinny Sanders, MD    Brief Narrative:  Joshua Stark is a 73 y.o. male with medical history significant for coronary artery disease, hypertension, seizure disorder, and chronic kidney disease stage III, now presenting to the emergency department with left knee pain after a fall at home. Assessment & Plan:   Principal Problem:   Femur fracture, left (HCC) Active Problems:   Anemia of chronic disease   Seizure disorder (HCC)   Hyperkalemia   CAD S/P percutaneous coronary angioplasty   Essential hypertension   CKD (chronic kidney disease) stage 3, GFR 30-59 ml/min (HCC)   Left femur fracture following a mechanical fall X-ray showing mid distal left femur fracture. Orthopedic consulted and underwent ORIF. PT working today.  Gentle hydration, pain control.   Hyperkalemia with  Acute on stage III CKD. Improving with lokelma.  Recheck K tonight.  Discussed with Dr Royce Macadamia with renal, recommended to continue with lokelma and continue to hold lisinopril .  Hyperkalemia probably sec to prbc transfusion, and mild rhabdomyolysis from the fall.  Continue with IV fluids for the AKI.  Check UA and US RENAL .    Hypertension Well-controlled.   History of coronary artery disease Patient denies any chest pain at this time.  Hold aspirin and ACE inhibitor. Resume aspirin in am.    Anemia of chronic disease/ acute anemia from blood loss from surgery:  Continue to monitor hemoglobin., s/p 1 unit of prbc transfusion.    Seizure disorder Continue with Depakote   Mild rhabdomyolysis:  Will continue with IV fluids and check CK levels in am.    DVT prophylaxis: SCDs Code Status: (Full code Family Communication: None at bedside Disposition Plan: Pending clinical improvement and resolution of hyperkalemia  Consultants:   Orthopedics  Procedures:  ORIF  Antimicrobials:  None  Subjective: Pain controlled, no nausea or vomiting.  No chest pain dizziness.   Objective: Vitals:   08/24/18 0020 08/24/18 0144 08/24/18 1116 08/24/18 1435  BP: (!) 116/53 113/66 125/61 135/73  Pulse: 72 69 77 86  Resp: 17 16 20 17   Temp: 97.8 F (36.6 C) 97.9 F (36.6 C) 98.6 F (37 C) 98.2 F (36.8 C)  TempSrc:  Oral Oral Oral  SpO2: 96% 97% 100% 98%  Weight:      Height:        Intake/Output Summary (Last 24 hours) at 08/24/2018 1735 Last data filed at 08/24/2018 1548 Gross per 24 hour  Intake 3675.4 ml  Output 1955 ml  Net 1720.4 ml   Filed Weights   08/22/18 2034 08/23/18 0115 08/23/18 1653  Weight: 122.5 kg 123.9 kg 123.9 kg    Examination:  General exam: Appears calm and comfortable , not Iin distress.  Respiratory system: Clear to auscultation. Respiratory effort normal. No wheezing heard.  Cardiovascular system: S1 & S2 heard, RRR. Gastrointestinal system: Abdomen is nondistended, soft and non tender bowel sounds good.  Central nervous system: Alert and oriented. No focal neurological deficits. Extremities: Left leg tender ROM. Bandaged.  Skin: No rashes, lesions or ulcers Psychiatry:  Mood & affect appropriate.     Data Reviewed: I have personally reviewed following labs and imaging studies  CBC: Recent Labs  Lab 08/22/18 2153 08/23/18 0213 08/23/18 1938 08/23/18 2119 08/24/18 0537  WBC 11.3* 8.0  --   --  7.4  NEUTROABS 9.2*  --   --   --   --  HGB 10.9* 10.5* 7.5* 9.3* 8.8*  HCT 34.2* 32.7* 22.0* 30.5* 28.8*  MCV 95.8 95.6  --   --  97.0  PLT 184 159  --   --  009*   Basic Metabolic Panel: Recent Labs  Lab 08/22/18 2153 08/23/18 0213  08/23/18 1509 08/23/18 1938 08/23/18 2119 08/24/18 0537 08/24/18 1155  NA 137 137  --   --  140  --  137  --   K 6.0* 5.5*   < > 5.7* 5.2* 5.6* 6.0* 5.5*  CL 110 112*  --   --   --   --  110  --   CO2 18* 19*  --   --   --   --  19*  --   GLUCOSE 138* 148*  --   --  126*  --  178*  --   BUN  35* 38*  --   --   --   --  49*  --   CREATININE 1.25* 1.27*  --   --   --   --  1.98*  --   CALCIUM 8.8* 8.6*  --   --   --   --  8.4*  --    < > = values in this interval not displayed.   GFR: Estimated Creatinine Clearance: 45.8 mL/min (A) (by C-G formula based on SCr of 1.98 mg/dL (H)). Liver Function Tests: Recent Labs  Lab 08/22/18 2153  AST 21  ALT 19  ALKPHOS 62  BILITOT 0.4  PROT 6.2*  ALBUMIN 3.6   No results for input(s): LIPASE, AMYLASE in the last 168 hours. No results for input(s): AMMONIA in the last 168 hours. Coagulation Profile: No results for input(s): INR, PROTIME in the last 168 hours. Cardiac Enzymes: Recent Labs  Lab 08/24/18 0537  CKTOTAL 798*   BNP (last 3 results) No results for input(s): PROBNP in the last 8760 hours. HbA1C: No results for input(s): HGBA1C in the last 72 hours. CBG: No results for input(s): GLUCAP in the last 168 hours. Lipid Profile: No results for input(s): CHOL, HDL, LDLCALC, TRIG, CHOLHDL, LDLDIRECT in the last 72 hours. Thyroid Function Tests: No results for input(s): TSH, T4TOTAL, FREET4, T3FREE, THYROIDAB in the last 72 hours. Anemia Panel: No results for input(s): VITAMINB12, FOLATE, FERRITIN, TIBC, IRON, RETICCTPCT in the last 72 hours. Sepsis Labs: No results for input(s): PROCALCITON, LATICACIDVEN in the last 168 hours.  Recent Results (from the past 240 hour(s))  SARS Coronavirus 2 (CEPHEID - Performed in [place] hospital lab), Hosp Order     Status: None   Collection Time: 08/22/18  9:53 PM   Specimen: Nasopharyngeal Swab  Result Value Ref Range Status   SARS Coronavirus 2 NEGATIVE NEGATIVE Final    Comment: (NOTE) If result is NEGATIVE SARS-CoV-2 target nucleic acids are NOT DETECTED. The SARS-CoV-2 RNA is generally detectable in upper and lower  respiratory specimens during the acute phase of infection. The lowest  concentration of SARS-CoV-2 viral copies this assay can detect is 250  copies / mL. A  negative result does not preclude SARS-CoV-2 infection  and should not be used as the sole basis for treatment or other  patient management decisions.  A negative result may occur with  improper specimen collection / handling, submission of specimen other  than nasopharyngeal swab, presence of viral mutation(s) within the  areas targeted by this assay, and inadequate number of viral copies  (<250 copies / mL). A negative result must be combined  with clinical  observations, patient history, and epidemiological information. If result is POSITIVE SARS-CoV-2 target nucleic acids are DETECTED. The SARS-CoV-2 RNA is generally detectable in upper and lower  respiratory specimens dur ing the acute phase of infection.  Positive  results are indicative of active infection with SARS-CoV-2.  Clinical  correlation with patient history and other diagnostic information is  necessary to determine patient infection status.  Positive results do  not rule out bacterial infection or co-infection with other viruses. If result is PRESUMPTIVE POSTIVE SARS-CoV-2 nucleic acids MAY BE PRESENT.   A presumptive positive result was obtained on the submitted specimen  and confirmed on repeat testing.  While 2019 novel coronavirus  (SARS-CoV-2) nucleic acids may be present in the submitted sample  additional confirmatory testing may be necessary for epidemiological  and / or clinical management purposes  to differentiate between  SARS-CoV-2 and other Sarbecovirus currently known to infect humans.  If clinically indicated additional testing with an alternate test  methodology [phone]) is advised. The SARS-CoV-2 RNA is generally  detectable in upper and lower respiratory sp ecimens during the acute  phase of infection. The expected result is Negative. Fact Sheet for Patients:  StrictlyIdeas.no Fact Sheet for Healthcare Providers: BankingDealers.co.za This test is not  yet approved or cleared by the [place] FDA and has been authorized for detection and/or diagnosis of SARS-CoV-2 by FDA under an Emergency Use Authorization (EUA).  This EUA will remain in effect (meaning this test can be used) for the duration of the COVID-19 declaration under Section 564(b)(1) of the Act, 21 U.S.C. section 360bbb-3(b)(1), unless the authorization is terminated or revoked sooner. Performed at [facility], [place] [address], [place], [place] [address]   Surgical PCR screen     Status: None   Collection Time: 08/23/18  2:03 AM   Specimen: Nasal Mucosa; Nasal Swab  Result Value Ref Range Status   MRSA, PCR NEGATIVE NEGATIVE Final   Staphylococcus aureus NEGATIVE NEGATIVE Final    Comment: (NOTE) The Xpert SA Assay (FDA approved for NASAL specimens in patients 79 years of age and older), is one component of a comprehensive surveillance program. It is not intended to diagnose infection nor to guide or monitor treatment. Performed at [facility], [place] [address] [place], [place] [address]          Radiology Studies: Dg Chest Port 1 View  Result Date: 08/22/2018 CLINICAL DATA:  Preop for femur fracture EXAM: PORTABLE CHEST 1 VIEW COMPARISON:  None. FINDINGS: The heart size and mediastinal contours are within normal limits. Both lungs are clear. The visualized skeletal structures are unremarkable. IMPRESSION: No acute cardiopulmonary process. Electronically Signed   By: Prudencio Pair M.D.   On: 08/22/2018 23:40   Dg C-arm 1-60 Min-no Report  Result Date: 08/23/2018 Fluoroscopy was utilized by the requesting physician.  No radiographic interpretation.   Dg Femur Min 2 Views Left  Result Date: 08/23/2018 CLINICAL DATA:  IM nail left femur. EXAM: LEFT FEMUR 2 VIEWS; DG C-ARM 1-60 MIN-NO REPORT COMPARISON:  Preoperative radiograph yesterday. FINDINGS: Six fluoroscopic spot images obtained in the operating room. Intramedullary  nail with cerclage wire and locking screw fixation traverse midshaft femoral fracture. Fracture is in improved alignment compared to preoperative imaging. Total knee arthroplasty is partially included. Total fluoroscopy time 1 minutes 18 seconds. Total dose 17.0 mGy. IMPRESSION: Intraoperative fluoroscopy during left femur fracture ORIF. Electronically Signed   By: Keith Rake M.D.   On: 08/23/2018 20:36  Dg Femur Min 2 Views Left  Result Date: 08/22/2018 CLINICAL DATA:  Patient tripped and fell EXAM: LEFT FEMUR 2 VIEWS COMPARISON:  None. FINDINGS: There is obliquely oriented comminuted fracture of the midshaft of the femur with medial angulation and overlap of fracture fragments. The fracture line extends to the distal femur, however the total knee arthroplasty appears to be intact. There is overlying soft tissue swelling. IMPRESSION: Comminuted obliquely oriented fracture seen through the mid to distal femur. Left total knee arthroplasty which appears to be intact. Electronically Signed   By: Prudencio Pair M.D.   On: 08/22/2018 22:42        Scheduled Meds:  amLODipine  5 mg Oral Daily   aspirin EC  81 mg Oral BID   divalproex  250 mg Oral BID   docusate sodium  100 mg Oral BID   feeding supplement  237 mL Oral BID BM   ferrous sulfate  325 mg Oral TID PC   multivitamin with minerals  1 tablet Oral Daily   pantoprazole  40 mg Oral Daily   sodium zirconium cyclosilicate  10 g Oral BID   Continuous Infusions:  sodium chloride Stopped (08/23/18 2205)   sodium chloride 75 mL/hr at 08/23/18 2227   methocarbamol (ROBAXIN) IV       LOS: 2 days    Time spent: [place], MD [facility] Pager [phone]   If 7PM-7AM, please contact night-coverage [email or web address] Password [email or web address] 08/24/2018, 5:35 PM

## 2018-08-24 NOTE — Op Note (Signed)
NAME: Joshua Stark, Joshua Stark MEDICAL RECORD [id number] ACCOUNT [id number] DATE OF BIRTH:Feb 18, 1945 FACILITY: [facility] LOCATION: [facility] PHYSICIAN:Dermot Gremillion D. Veronique Warga, MD  OPERATIVE REPORT  DATE OF PROCEDURE:  08/23/2018  PREOPERATIVE DIAGNOSIS:  Distal comminuted spiral fracture involving the distal femur above a total knee replacement, left distal periprosthetic femur fracture.  POSTOPERATIVE DIAGNOSIS:  Distal comminuted spiral fracture involving the distal femur above a total knee replacement, left distal periprosthetic femur fracture.  PROCEDURE:  Open reduction internal fixation left distal periprosthetic femur fracture utilizing 2 Zimmer cables and a Biomet/Zimmer [place] retrograde femoral nail measuring 13.5 mm x 340 mm, the 4 distal screws locked and 1 proximal locking screw.  SURGEON:  Paralee Cancel, MD  ASSISTANT:  Griffith Citron, PA-C.  Note that Ms. Nehemiah Settle was present for the entirety of the case from preoperative positioning, perioperative management of the operative extremity, general facilitation of the case, and primary wound closure.  ANESTHESIA:  Spinal.  SPECIMENS:  None.  COMPLICATIONS:  None.  ESTIMATED BLOOD LOSS:  500 mL.  FLUIDS:  He did receive 1 unit packed red blood cells due to starting hemoglobin of 10 and a measured hemoglobin in OR of 7.5.  DRAINS:  None.  INDICATIONS:  The patient is a pleasant 73 year old male with history of left total knee replacement performed by myself about 2 years ago.  He had recovered well and was in his normal state of health.  He was working in his work shed yesterday when he  slipped and fell.  He had immediate onset of pain.  He initially felt that he had dislocated his knee.  He was brought to the emergency room, which revealed a stable femoral and tibial components on his knee replacement but displaced fracture in the  distal third of the femur with spiral obliquity extending into the distal aspect of the femur.  We discussed the  necessity for surgical repair.  We reviewed the risks of infection, nonunion, malunion, need for future surgeries.  Consent was obtained for  fracture management.  PROCEDURE IN DETAIL:  The patient was brought to the operative theater.  Once adequate anesthesia, preoperative antibiotics, and Ancef 3 g administered as well as tranexamic acid, he was positioned carefully on the flat [place] operating room table.  No  tourniquet was utilized.  The left hip was prepped out in the perineum with a 10/15 drape.  The left lower extremity was then prepped and draped in sterile fashion.  A timeout was performed identifying the patient, the planned procedure, and extremity.   Following the timeout, we began.  I utilized his old incision with a plan to extend this proximally so I could manually reduce this fracture and hold it in place with a cable.  The soft tissue dissection was carried down to the extensor mechanism, which  was then opened.  Hematoma was evacuated.  At this point, the 2 segments of the fracture were identified.  The distal segment which had been noted to have a spiral obliquity that was mildly displaced was manipulated manually until it was reduced in  anatomic position, both confirmed by palpation as well as radiographs.  A Zimmer cable was passed in this area and tensioned.  I then reduced the distal segment to the proximal segment with reduction maneuvers using a bone hook to elevate the distal  segment away from its contracted forces.  I was able to get it lined up nearly anatomically both by palpation as well as radiographically.  Once this was done, I placed  another cable at this segment.  At this point, I was able to stabilize the segmental  fracture and now felt better about the intramedullary nailing to prevent potential malunion with shortening or further displacement of the fracture segments.  The knee was flexed.  I removed cement and bone from the opening of the distal femoral   component.  I then passed a guidewire and fluoroscopic imaging to the lesser trochanter.  We measured and selected a 34 cm nail.  I then reamed over the top of the guidewire up to 14.5 mm.  We selected a 13.5 nail.  I measured it.  The final nail was  opened, attached to the insertion jig, and the nail was then passed by hand about 5 mm into the box of the femur.  With its appropriate position, everything was tightened down.  Utilizing the insertion jig, 4 screws were placed, 2 transverse lateral to  medial, 1 oblique lateral to medial, and 1 medial to lateral oblique screw.  Once this was done, we tightened the screws distally to lock them into place and then removed the jig.  I then performed under perfect-circle technique a proximal interlock.   Once this was done, final radiographs were obtained.  The cables were tensioned down appropriately, tightened and crimped and cut.  The wound was then irrigated with 1 L of normal saline solution pulse lavage.  The extensor mechanism was closed with a  combination of #1 Vicryl and #1 Stratafix sutures.  The remainder of the wound was closed with 2-0 Vicryl in a running Monocryl stitch.  The knee was then cleaned, dried, and dressed sterilely using surgical glue and an Aquacel dressing.  The proximal  interlocking screw was closed with 2-0 Vicryl, glue, and an Aquacel dressing.  The patient was then brought to the recovery room in stable condition, tolerating the procedure well.  Findings were reviewed with his spouse.  We will allow him to be partial  weightbearing and allow for knee range of motion based on fixation identified.  LN/NUANCE  D:08/23/2018 T:08/24/2018 JOB:[id number]

## 2018-08-24 NOTE — Progress Notes (Signed)
Subjective: 1 Day Post-Op Procedure(s) (LRB): INTRAMEDULLARY (IM) RETROGRADE FEMORAL NAILING (Left) Patient reports pain as mild.   Patient seen in rounds for Dr. Alvan Dame. Patient is well, and has had no acute complaints or problems other than discomfort in the left thigh. No acute events overnight. Foley catheter removed, voiding without difficulty. Positive flatus. Denies CP, SHOB, calf pain.  We will start therapy today.   Objective: Vital signs in last 24 hours: Temp:  [97.5 F (36.4 C)-97.9 F (36.6 C)] 97.9 F (36.6 C) (07/30 0144) Pulse Rate:  [65-75] 69 (07/30 0144) Resp:  [11-20] 16 (07/30 0144) BP: (94-122)/(53-76) 113/66 (07/30 0144) SpO2:  [96 %-100 %] 97 % (07/30 0144) Weight:  [123.9 kg] 123.9 kg (07/29 1653)  Intake/Output from previous day:  Intake/Output Summary (Last 24 hours) at 08/24/2018 0833 Last data filed at 08/24/2018 0513 Gross per 24 hour  Intake 3763.59 ml  Output 1430 ml  Net 2333.59 ml     Intake/Output this shift: No intake/output data recorded.  Labs: Recent Labs    08/22/18 2153 08/23/18 0213 08/23/18 1938 08/23/18 2119 08/24/18 0537  HGB 10.9* 10.5* 7.5* 9.3* 8.8*   Recent Labs    08/23/18 0213  08/23/18 2119 08/24/18 0537  WBC 8.0  --   --  7.4  RBC 3.42*  --   --  2.97*  HCT 32.7*   < > 30.5* 28.8*  PLT 159  --   --  110*   < > = values in this interval not displayed.   Recent Labs    08/23/18 0213  08/23/18 1938 08/23/18 2119 08/24/18 0537  NA 137  --  140  --  137  K 5.5*   < > 5.2* 5.6* 6.0*  CL 112*  --   --   --  110  CO2 19*  --   --   --  19*  BUN 38*  --   --   --  49*  CREATININE 1.27*  --   --   --  1.98*  GLUCOSE 148*  --  126*  --  178*  CALCIUM 8.6*  --   --   --  8.4*   < > = values in this interval not displayed.   No results for input(s): LABPT, INR in the last 72 hours.  Exam: General - Patient is Alert and Oriented Extremity - Neurologically intact Sensation intact distally Intact pulses  distally Dorsiflexion/Plantar flexion intact Dressing - dressing C/D/I Motor Function - intact, moving foot and toes well on exam.   Past Medical History:  Diagnosis Date  . Arthritis   . CAD S/P percutaneous coronary angioplasty 1999   PCI- prox LAD @ D1, SP1 trifurcation.  3.0 mm x 16 mm AVE-GFX BMS  . History of nuclear stress test August 2012   Treadmill Myoview: 9 minutes, 10 METS --> hypertensive response.  No evidence of ischemia or infarction.  EF 64%  . HLD (hyperlipidemia)   . Hypertension   . Obesity (BMI 30-39.9)   . Seizure disorder ([place])   . Seizures ([place])    hx of years ago     Assessment/Plan: 1 Day Post-Op Procedure(s) (LRB): INTRAMEDULLARY (IM) RETROGRADE FEMORAL NAILING (Left) Principal Problem:   Femur fracture, left (HCC) Active Problems:   Anemia of chronic disease   Seizure disorder (HCC)   Hyperkalemia   CAD S/P percutaneous coronary angioplasty   Essential hypertension   CKD (chronic kidney disease) stage 3, GFR 30-59 ml/min (HCC)  Estimated body mass index is 38.1 kg/m as calculated from the following:   Height as of this encounter: 5\' 11"  (1.803 m).   Weight as of this encounter: 123.9 kg. Advance diet Up with therapy  DVT Prophylaxis - Aspirin Partial weight bearing to the LLE. Full ROM of the left knee allowed. D/C O2 and pulse ox and try on room air.  Hgb stable at 8.8 today. Plan is to go Home after hospital stay. Patient is doing well this morning. Plan to work with therapy today on PWB status and ambulation.   Griffith Citron, PA-C Orthopedic Surgery 08/24/2018, 8:33 AM

## 2018-08-25 LAB — BASIC METABOLIC PANEL
Anion gap: 6 (ref 5–15)
Anion gap: 7 (ref 5–15)
BUN: 53 mg/dL — ABNORMAL HIGH (ref 8–23)
BUN: 54 mg/dL — ABNORMAL HIGH (ref 8–23)
CO2: 20 mmol/L — ABNORMAL LOW (ref 22–32)
CO2: 21 mmol/L — ABNORMAL LOW (ref 22–32)
Calcium: 8.1 mg/dL — ABNORMAL LOW (ref 8.9–10.3)
Calcium: 8.3 mg/dL — ABNORMAL LOW (ref 8.9–10.3)
Chloride: 111 mmol/L (ref 98–111)
Chloride: 110 mmol/L (ref 98–111)
Creatinine, Ser: 1.64 mg/dL — ABNORMAL HIGH (ref 0.61–1.24)
Creatinine, Ser: 1.55 mg/dL — ABNORMAL HIGH (ref 0.61–1.24)
GFR calc Af Amer: 48 mL/min — ABNORMAL LOW (ref >60)
GFR calc Af Amer: 51 mL/min — ABNORMAL LOW (ref >60)
GFR calc non Af Amer: 41 mL/min — ABNORMAL LOW (ref >60)
GFR calc non Af Amer: 44 mL/min — ABNORMAL LOW (ref >60)
Glucose, Bld: 97 mg/dL (ref 70–99)
Glucose, Bld: 119 mg/dL — ABNORMAL HIGH (ref 70–99)
Potassium: 5.6 mmol/L — ABNORMAL HIGH (ref 3.5–5.1)
Potassium: 5 mmol/L (ref 3.5–5.1)
Sodium: 137 mmol/L (ref 135–145)
Sodium: 138 mmol/L (ref 135–145)

## 2018-08-25 LAB — POTASSIUM: Potassium: 5.7 mmol/L — ABNORMAL HIGH (ref 3.5–5.1)

## 2018-08-25 LAB — CBC
HCT: 22.6 % — ABNORMAL LOW (ref 39.0–52.0)
Hemoglobin: 7.1 g/dL — ABNORMAL LOW (ref 13.0–17.0)
MCH: 30.1 pg (ref 26.0–34.0)
MCHC: 31.4 g/dL (ref 30.0–36.0)
MCV: 95.8 fL (ref 80.0–100.0)
Platelets: 106 10*3/uL — ABNORMAL LOW (ref 150–400)
RBC: 2.36 MIL/uL — ABNORMAL LOW (ref 4.22–5.81)
RDW: 14.4 % (ref 11.5–15.5)
WBC: 8.3 10*3/uL (ref 4.0–10.5)
nRBC: 0 % (ref 0.0–0.2)

## 2018-08-25 LAB — CK: Total CK: 1177 U/L — ABNORMAL HIGH (ref 49–397)

## 2018-08-25 MED ORDER — ASPIRIN 81 MG PO CHEW: 81.0000 mg | CHEWABLE_TABLET | Freq: Two times a day (BID) | ORAL | 0 refills | Status: AC

## 2018-08-25 MED ORDER — POLYETHYLENE GLYCOL 3350 17 G PO PACK: 17.0000 g | PACK | Freq: Two times a day (BID) | ORAL | 0 refills | Status: DC

## 2018-08-25 MED ORDER — OXYCODONE HCL 5 MG PO TABS: 5.0000 mg | ORAL_TABLET | ORAL | 0 refills | Status: DC | PRN

## 2018-08-25 MED ORDER — FERROUS SULFATE 325 (65 FE) MG PO TABS: 325.0000 mg | ORAL_TABLET | Freq: Three times a day (TID) | ORAL | 0 refills | Status: DC

## 2018-08-25 MED ORDER — DOCUSATE SODIUM 100 MG PO CAPS: 100.0000 mg | ORAL_CAPSULE | Freq: Two times a day (BID) | ORAL | 0 refills | Status: DC

## 2018-08-25 MED ORDER — ACETAMINOPHEN 500 MG PO TABS: 1000.0000 mg | ORAL_TABLET | Freq: Three times a day (TID) | ORAL | 0 refills | Status: DC

## 2018-08-25 MED ORDER — SODIUM POLYSTYRENE SULFONATE 15 GM/60ML PO SUSP
30.0000 g | Freq: Once | ORAL | Status: AC
  Administered 2018-08-25: 30 g via ORAL
  Filled 2018-08-25: qty 120

## 2018-08-25 MED ORDER — POLYETHYLENE GLYCOL 3350 17 G PO PACK
17.0000 g | PACK | Freq: Two times a day (BID) | ORAL | Status: DC
  Administered 2018-08-25 – 2018-08-26 (×3): 17 g via ORAL
  Filled 2018-08-25 (×3): qty 1

## 2018-08-25 MED ORDER — METHOCARBAMOL 500 MG PO TABS: 500.0000 mg | ORAL_TABLET | Freq: Four times a day (QID) | ORAL | 0 refills | Status: DC | PRN

## 2018-08-25 NOTE — Care Management Important Message (Signed)
Important Message  Patient Details IM Letter given to Rhea Pink SW to present to the Patient Name: Joshua Stark MRN: [id number] Date of Birth: 1945-09-05   Medicare Important Message Given:  Yes     Kerin Salen 08/25/2018, 11:39 AM

## 2018-08-25 NOTE — Progress Notes (Signed)
PROGRESS NOTE    Joshua Stark  UKG:[id number] DOB: [birth year] DOA: 08/22/2018 PCP: Jinny Sanders, MD    Brief Narrative:  Joshua Stark is a 73 y.o. male with medical history significant for coronary artery disease, hypertension, seizure disorder, and chronic kidney disease stage III, now presenting to the emergency department with left knee pain after a fall at home. Assessment & Plan:   Principal Problem:   Femur fracture, left (HCC) Active Problems:   Anemia of chronic disease   Seizure disorder (HCC)   Hyperkalemia   CAD S/P percutaneous coronary angioplasty   Essential hypertension   CKD (chronic kidney disease) stage 3, GFR 30-59 ml/min (HCC)   Left femur fracture following a mechanical fall X-ray showing mid distal left femur fracture. Orthopedic consulted and underwent ORIF. PT recommended home health PT.  Gentle hydration, pain control.   Hyperkalemia with  Acute on stage III CKD. SLIGHT IMPROVEMENT TO 5.6. ordered kayexalate 30 gm today. Repeat K tonight. Pt totally asymptomatic.  Discussed with Dr Royce Macadamia with renal, recommended to continue with hydration and potassium removal. Hyperkalemia probably sec to prbc transfusion, and mild rhabdomyolysis from the fall.  Continue with IV fluids for the AKI.  UA neg for infection and US RENAL does not show any obstruction.    Hypertension Well-controlled.   History of coronary artery disease Patient denies any chest pain at this time.  Hold aspirin and ACE inhibitor.    Anemia of chronic disease/ acute anemia from blood loss from surgery:  Continue to monitor hemoglobin., s/p 1 unit of prbc transfusion.  Hemoglobin 7.1 this am. On iron supplementation.  Transfuse to keep hemoglobin greater than 7.    Seizure disorder Continue with Depakote   Mild rhabdomyolysis:  Will continue with IV fluids and check CK levels in am.    DVT prophylaxis: SCDs Code Status: (Full code Family Communication: None at  bedside Disposition Plan: Pending clinical improvement and resolution of hyperkalemia  Consultants:   Orthopedics  Procedures:  ORIF  Antimicrobials: None  Subjective: Pain controlled, no nausea or vomiting.  No chest pain dizziness.   Objective: Vitals:   08/24/18 1435 08/24/18 1829 08/24/18 2210 08/25/18 0538  BP: 135/73 (!) 116/53 133/63 (!) 136/58  Pulse: 86 79 86 78  Resp: 17 18 18 16   Temp: 98.2 F (36.8 C) 99.5 F (37.5 C) 98.4 F (36.9 C) 97.6 F (36.4 C)  TempSrc: Oral Oral Oral Oral  SpO2: 98% 96% 96% 97%  Weight:      Height:        Intake/Output Summary (Last 24 hours) at 08/25/2018 1043 Last data filed at 08/25/2018 0730 Gross per 24 hour  Intake 1585.93 ml  Output 1675 ml  Net -89.07 ml   Filed Weights   08/22/18 2034 08/23/18 0115 08/23/18 1653  Weight: 122.5 kg 123.9 kg 123.9 kg    Examination:  General exam: Appears calm and comfortable , not Iin distress.  Respiratory system: Clear to auscultation. Respiratory effort normal. No wheezing heard.  Cardiovascular system: S1 & S2 heard, RRR. Gastrointestinal system: Abdomen is nondistended, soft and non tender bowel sounds good.  Central nervous system: Alert and oriented. No focal neurological deficits. Extremities: Left leg tender ROM. Bandaged.  Skin: No rashes, lesions or ulcers Psychiatry:  Mood & affect appropriate.     Data Reviewed: I have personally reviewed following labs and imaging studies  CBC: Recent Labs  Lab 08/22/18 2153 08/23/18 7628 08/23/18 1938 08/23/18 2119 08/24/18 0537 08/25/18  0526  WBC 11.3* 8.0  --   --  7.4 8.3  NEUTROABS 9.2*  --   --   --   --   --   HGB 10.9* 10.5* 7.5* 9.3* 8.8* 7.1*  HCT 34.2* 32.7* 22.0* 30.5* 28.8* 22.6*  MCV 95.8 95.6  --   --  97.0 95.8  PLT 184 159  --   --  110* 902*   Basic Metabolic Panel: Recent Labs  Lab 08/22/18 2153 08/23/18 0213  08/23/18 1938  08/24/18 0537 08/24/18 1155 08/24/18 1841 08/25/18 0006 08/25/18  0526  NA 137 137  --  140  --  137  --   --   --  137  K 6.0* 5.5*   < > 5.2*   < > 6.0* 5.5* 5.6* 5.7* 5.6*  CL 110 112*  --   --   --  110  --   --   --  111  CO2 18* 19*  --   --   --  19*  --   --   --  20*  GLUCOSE 138* 148*  --  126*  --  178*  --   --   --  97  BUN 35* 38*  --   --   --  49*  --   --   --  53*  CREATININE 1.25* 1.27*  --   --   --  1.98*  --   --   --  1.64*  CALCIUM 8.8* 8.6*  --   --   --  8.4*  --   --   --  8.1*   < > = values in this interval not displayed.   GFR: Estimated Creatinine Clearance: 55.3 mL/min (A) (by C-G formula based on SCr of 1.64 mg/dL (H)). Liver Function Tests: Recent Labs  Lab 08/22/18 2153  AST 21  ALT 19  ALKPHOS 62  BILITOT 0.4  PROT 6.2*  ALBUMIN 3.6   No results for input(s): LIPASE, AMYLASE in the last 168 hours. No results for input(s): AMMONIA in the last 168 hours. Coagulation Profile: No results for input(s): INR, PROTIME in the last 168 hours. Cardiac Enzymes: Recent Labs  Lab 08/24/18 0537  CKTOTAL 798*   BNP (last 3 results) No results for input(s): PROBNP in the last 8760 hours. HbA1C: No results for input(s): HGBA1C in the last 72 hours. CBG: No results for input(s): GLUCAP in the last 168 hours. Lipid Profile: No results for input(s): CHOL, HDL, LDLCALC, TRIG, CHOLHDL, LDLDIRECT in the last 72 hours. Thyroid Function Tests: No results for input(s): TSH, T4TOTAL, FREET4, T3FREE, THYROIDAB in the last 72 hours. Anemia Panel: No results for input(s): VITAMINB12, FOLATE, FERRITIN, TIBC, IRON, RETICCTPCT in the last 72 hours. Sepsis Labs: No results for input(s): PROCALCITON, LATICACIDVEN in the last 168 hours.  Recent Results (from the past 240 hour(s))  SARS Coronavirus 2 (CEPHEID - Performed in [place] hospital lab), Hosp Order     Status: None   Collection Time: 08/22/18  9:53 PM   Specimen: Nasopharyngeal Swab  Result Value Ref Range Status   SARS Coronavirus 2 NEGATIVE NEGATIVE Final     Comment: (NOTE) If result is NEGATIVE SARS-CoV-2 target nucleic acids are NOT DETECTED. The SARS-CoV-2 RNA is generally detectable in upper and lower  respiratory specimens during the acute phase of infection. The lowest  concentration of SARS-CoV-2 viral copies this assay can detect is 250  copies / mL. A negative result does  not preclude SARS-CoV-2 infection  and should not be used as the sole basis for treatment or other  patient management decisions.  A negative result may occur with  improper specimen collection / handling, submission of specimen other  than nasopharyngeal swab, presence of viral mutation(s) within the  areas targeted by this assay, and inadequate number of viral copies  (<250 copies / mL). A negative result must be combined with clinical  observations, patient history, and epidemiological information. If result is POSITIVE SARS-CoV-2 target nucleic acids are DETECTED. The SARS-CoV-2 RNA is generally detectable in upper and lower  respiratory specimens dur ing the acute phase of infection.  Positive  results are indicative of active infection with SARS-CoV-2.  Clinical  correlation with patient history and other diagnostic information is  necessary to determine patient infection status.  Positive results do  not rule out bacterial infection or co-infection with other viruses. If result is PRESUMPTIVE POSTIVE SARS-CoV-2 nucleic acids MAY BE PRESENT.   A presumptive positive result was obtained on the submitted specimen  and confirmed on repeat testing.  While 2019 novel coronavirus  (SARS-CoV-2) nucleic acids may be present in the submitted sample  additional confirmatory testing may be necessary for epidemiological  and / or clinical management purposes  to differentiate between  SARS-CoV-2 and other [place]ovirus currently known to infect humans.  If clinically indicated additional testing with an alternate test  methodology [id number]) is advised. The SARS-CoV-2  RNA is generally  detectable in upper and lower respiratory sp ecimens during the acute  phase of infection. The expected result is Negative. Fact Sheet for Patients:  StrictlyIdeas.no Fact Sheet for Healthcare Providers: BankingDealers.co.za This test is not yet approved or cleared by the [place] FDA and has been authorized for detection and/or diagnosis of SARS-CoV-2 by FDA under an Emergency Use Authorization (EUA).  This EUA will remain in effect (meaning this test can be used) for the duration of the COVID-19 declaration under Section 564(b)(1) of the Act, 21 U.S.C. section 360bbb-3(b)(1), unless the authorization is terminated or revoked sooner. Performed at [facility], [place] [address] [place], [place] [address]   Surgical PCR screen     Status: None   Collection Time: 08/23/18  2:03 AM   Specimen: Nasal Mucosa; Nasal Swab  Result Value Ref Range Status   MRSA, PCR NEGATIVE NEGATIVE Final   Staphylococcus aureus NEGATIVE NEGATIVE Final    Comment: (NOTE) The Xpert SA Assay (FDA approved for NASAL specimens in patients 21 years of age and older), is one component of a comprehensive surveillance program. It is not intended to diagnose infection nor to guide or monitor treatment. Performed at [facility], [place] [address] [place], [place] [address]          Radiology Studies: US Renal  Result Date: 08/24/2018 CLINICAL DATA:  Acute kidney injury EXAM: RENAL / URINARY TRACT ULTRASOUND COMPLETE COMPARISON:  December 03, 2015 FINDINGS: Right Kidney: Renal measurements: 10.6 x 4.5 x 5.0 cm = volume: 125 mL. There appears to be slight cortical thinning with increased echogenicity. A tiny anechoic cyst seen within the midpole measuring 1.7 cm. Left Kidney: Renal measurements: 10.9 x 4.4 x 5.0 cm = volume: 126 mL. There appears to be slight cortical thinning and increased echogenicity. No  renal masses seen. Bladder: The bladder is partially distended. IMPRESSION: No acute kidney or bladder abnormality. Electronically Signed   By: Prudencio Pair M.D.   On: 08/24/2018 20:41   Dg C-arm  1-60 Min-no Report  Result Date: 08/23/2018 CLINICAL DATA:  IM nail left femur. EXAM: LEFT FEMUR 2 VIEWS; DG C-ARM 1-60 MIN-NO REPORT COMPARISON:  Preoperative radiograph yesterday. FINDINGS: Six fluoroscopic spot images obtained in the operating room. Intramedullary nail with cerclage wire and locking screw fixation traverse midshaft femoral fracture. Fracture is in improved alignment compared to preoperative imaging. Total knee arthroplasty is partially included. Total fluoroscopy time 1 minutes 18 seconds. Total dose 17.0 mGy. IMPRESSION: Intraoperative fluoroscopy during left femur fracture ORIF. Electronically Signed   By: Keith Rake M.D.   On: 08/23/2018 20:36   Dg Femur Min 2 Views Left  Result Date: 08/23/2018 CLINICAL DATA:  IM nail left femur. EXAM: LEFT FEMUR 2 VIEWS; DG C-ARM 1-60 MIN-NO REPORT COMPARISON:  Preoperative radiograph yesterday. FINDINGS: Six fluoroscopic spot images obtained in the operating room. Intramedullary nail with cerclage wire and locking screw fixation traverse midshaft femoral fracture. Fracture is in improved alignment compared to preoperative imaging. Total knee arthroplasty is partially included. Total fluoroscopy time 1 minutes 18 seconds. Total dose 17.0 mGy. IMPRESSION: Intraoperative fluoroscopy during left femur fracture ORIF. Electronically Signed   By: Keith Rake M.D.   On: 08/23/2018 20:36        Scheduled Meds: . amLODipine  5 mg Oral Daily  . aspirin EC  81 mg Oral BID  . divalproex  250 mg Oral BID  . docusate sodium  100 mg Oral BID  . feeding supplement  237 mL Oral BID BM  . ferrous sulfate  325 mg Oral TID PC  . multivitamin with minerals  1 tablet Oral Daily  . pantoprazole  40 mg Oral Daily  . sodium polystyrene  30 g Oral Once    Continuous Infusions: . sodium chloride Stopped (08/23/18 2205)  . sodium chloride 75 mL/hr at 08/25/18 0027  . methocarbamol (ROBAXIN) IV       LOS: 3 days    Time spent: [place], MD [facility] Pager [phone]   If 7PM-7AM, please contact night-coverage [email or web address] Password [facility] 08/25/2018, 10:43 AM

## 2018-08-25 NOTE — Progress Notes (Signed)
     Subjective: 2 Days Post-Op Procedure(s) (LRB): INTRAMEDULLARY (IM) RETROGRADE FEMORAL NAILING (Left)   Patient reports pain as mild, pain controlled with medication.  No events throughout the night.  Dr. Alvan Dame to discussed the procedure, findings and expectations moving forward.  He did discussed with him the partial weightbearing status to help allow the bones to heal.  Patient has a hemoglobin of 7.1 this morning but is asymptomatic.  Dr. Faythe Dingwall discussed with him the use of iron 3 times a day with food to help build up his blood.  Dr. Ihor Gully stated orthopedically the patient is stable and can DC home when medically ready.   Objective:   VITALS:   Vitals:   08/24/18 2210 08/25/18 0538  BP: 133/63 (!) 136/58  Pulse: 86 78  Resp: 18 16  Temp: 98.4 F (36.9 C) 97.6 F (36.4 C)  SpO2: 96% 97%    Neurovascular intact Incision: dressing C/D/I  LABS Recent Labs    08/23/18 0213  08/23/18 2119 08/24/18 0537 08/25/18 0526  HGB 10.5*   < > 9.3* 8.8* 7.1*  HCT 32.7*   < > 30.5* 28.8* 22.6*  WBC 8.0  --   --  7.4 8.3  PLT 159  --   --  110* 106*   < > = values in this interval not displayed.    Recent Labs    08/23/18 0213  08/23/18 1938  08/24/18 0537  08/24/18 1841 08/25/18 0006 08/25/18 0526  NA 137  --  140  --  137  --   --   --  137  K 5.5*   < > 5.2*   < > 6.0*   < > 5.6* 5.7* 5.6*  BUN 38*  --   --   --  49*  --   --   --  53*  CREATININE 1.27*  --   --   --  1.98*  --   --   --  1.64*  GLUCOSE 148*  --  126*  --  178*  --   --   --  97   < > = values in this interval not displayed.     Assessment/Plan: 2 Days Post-Op Procedure(s) (LRB): INTRAMEDULLARY (IM) RETROGRADE FEMORAL NAILING (Left)  Up with therapy Discharge home when ready medically   Ortho recommendations:  ASA 81 mg bid for 4 weeks for anticoagulation, unless other medically indicated.  Norco for pain management (Rx sent to pharmacy).  Robaxin for muscle spasms (Rx sent to pharmacy).   MiraLax and Colace for constipation  (Rx sent to pharmacy).  Iron 325 mg tid for 2-3 weeks  (Rx sent to pharmacy).   PWB 50% on the left leg.  Dressing to remain in place until follow in clinic in 2 weeks.  Dressing is waterproof and may shower with it in place.  Follow up in 2 weeks at [facility]. Follow up with OLIN,Ryann Leavitt D in 2 weeks.  Contact information:  [facility] [address] [place]   PAC  08/25/2018, 8:00 AM

## 2018-08-25 NOTE — Progress Notes (Signed)
Physical Therapy Treatment Patient Details Name: Joshua Stark MRN: [id number] DOB: [birth year] Today's Date: 08/25/2018    History of Present Illness Pt is a 73 year old male with PMHx significant for L TKA 2018, HTN, CAD, seizures and admitted after sustaining fracture after fall at home.  Pt currently s/p Open reduction internal fixation left distal periprosthetic femur fracture.    PT Comments    Pt ambulated in hallway and performed LE exercises with assist.  Pt likely to d/c home later today or tomorrow.  Follow Up Recommendations  Home health PT;Supervision/Assistance - 24 hour     Equipment Recommendations  None recommended by PT    Recommendations for Other Services       Precautions / Restrictions Precautions Precautions: Fall Restrictions Weight Bearing Restrictions: Yes LLE Weight Bearing: Partial weight bearing LLE Partial Weight Bearing Percentage or Pounds: 50%    Mobility  Bed Mobility Overal bed mobility: Needs Assistance Bed Mobility: Supine to Sit     Supine to sit: Min assist;Min guard     General bed mobility comments: verbal cues for self assist of L LE using long sheet  Transfers Overall transfer level: Needs assistance Equipment used: Rolling walker (2 wheeled) Transfers: Sit to/from Stand Sit to Stand: Min guard         General transfer comment: verbal cues for UE and LE positioning, increased time and effort  Ambulation/Gait Ambulation/Gait assistance: Min guard Gait Distance (Feet): 160 Feet Assistive device: Rolling walker (2 wheeled) Gait Pattern/deviations: Step-to pattern;Decreased stance time - left;Decreased weight shift to left;Antalgic Gait velocity: decr   General Gait Details: verbal cues for sequence, PWB status, RW positioning, step length   Stairs             Wheelchair Mobility    Modified Rankin (Stroke Patients Only)       Balance                                             Cognition Arousal/Alertness: Awake/alert Behavior During Therapy: WFL for tasks assessed/performed Overall Cognitive Status: Within Functional Limits for tasks assessed                                        Exercises General Exercises - Lower Extremity Ankle Circles/Pumps: AROM;10 reps;Both Quad Sets: AROM;Left;10 reps Short Arc QuadSinclair Ship;Left;10 reps Heel Slides: AAROM;Left;10 reps Hip ABduction/ADduction: AAROM;Left;10 reps    General Comments        Pertinent Vitals/Pain Pain Assessment: 0-10 Pain Score: 3  Pain Location: L thigh, knee Pain Descriptors / Indicators: Sore;Aching Pain Intervention(s): Limited activity within patient's tolerance;Monitored during session;Repositioned    Home Living                      Prior Function            PT Goals (current goals can now be found in the care plan section) Progress towards PT goals: Progressing toward goals    Frequency    Min 6X/week      PT Plan Current plan remains appropriate    Co-evaluation              AM-PAC PT "6 Clicks" Mobility   Outcome Measure  Help needed turning from your back  to your side while in a flat bed without using bedrails?: A Little Help needed moving from lying on your back to sitting on the side of a flat bed without using bedrails?: A Little Help needed moving to and from a bed to a chair (including a wheelchair)?: A Little Help needed standing up from a chair using your arms (e.g., wheelchair or bedside chair)?: A Little Help needed to walk in hospital room?: A Little Help needed climbing 3-5 steps with a railing? : A Lot 6 Click Score: 17    End of Session Equipment Utilized During Treatment: Gait belt Activity Tolerance: Patient tolerated treatment well Patient left: with call bell/phone within reach;in chair   PT Visit Diagnosis: Other abnormalities of gait and mobility (R26.89)     Time: 5456-2563 PT Time Calculation (min) (ACUTE  ONLY): 20 min  Charges:  $Gait Training: 8-22 mins                    Carmelia Bake, PT, DPT Acute Rehabilitation Services Office: [phone] Pager: [phone]  Trena Platt 08/25/2018, 2:02 PM

## 2018-08-25 NOTE — Plan of Care (Signed)
Patiens bed alarm is set. Pt will call when needing assistance.

## 2018-08-25 NOTE — Progress Notes (Signed)
CRITICAL VALUE ALERT  Critical Value:  HGB 7.1  Date & Time Notied:  08/25/18 NOT NOTIFIED BY LAB 1045   Provider Notified: AKULA  Orders Received/Actions taken:

## 2018-08-26 DIAGNOSIS — N179 Acute kidney failure, unspecified: Secondary | ICD-10-CM

## 2018-08-26 LAB — CBC WITH DIFFERENTIAL/PLATELET
Abs Immature Granulocytes: 0.04 10*3/uL (ref 0.00–0.07)
Basophils Absolute: 0 10*3/uL (ref 0.0–0.1)
Basophils Relative: 0 %
Eosinophils Absolute: 0.1 10*3/uL (ref 0.0–0.5)
Eosinophils Relative: 1 %
HCT: 22.6 % — ABNORMAL LOW (ref 39.0–52.0)
Hemoglobin: 7 g/dL — ABNORMAL LOW (ref 13.0–17.0)
Immature Granulocytes: 1 %
Lymphocytes Relative: 24 %
Lymphs Abs: 1.5 10*3/uL (ref 0.7–4.0)
MCH: 29.9 pg (ref 26.0–34.0)
MCHC: 31 g/dL (ref 30.0–36.0)
MCV: 96.6 fL (ref 80.0–100.0)
Monocytes Absolute: 0.8 10*3/uL (ref 0.1–1.0)
Monocytes Relative: 13 %
Neutro Abs: 3.8 10*3/uL (ref 1.7–7.7)
Neutrophils Relative %: 61 %
Platelets: 104 10*3/uL — ABNORMAL LOW (ref 150–400)
RBC: 2.34 MIL/uL — ABNORMAL LOW (ref 4.22–5.81)
RDW: 14.1 % (ref 11.5–15.5)
WBC: 6.2 10*3/uL (ref 4.0–10.5)
nRBC: 0 % (ref 0.0–0.2)

## 2018-08-26 LAB — BASIC METABOLIC PANEL
Anion gap: 7 (ref 5–15)
BUN: 46 mg/dL — ABNORMAL HIGH (ref 8–23)
CO2: 24 mmol/L (ref 22–32)
Calcium: 8.4 mg/dL — ABNORMAL LOW (ref 8.9–10.3)
Chloride: 109 mmol/L (ref 98–111)
Creatinine, Ser: 1.28 mg/dL — ABNORMAL HIGH (ref 0.61–1.24)
GFR calc Af Amer: >60 mL/min (ref >60)
GFR calc non Af Amer: 56 mL/min — ABNORMAL LOW (ref >60)
Glucose, Bld: 102 mg/dL — ABNORMAL HIGH (ref 70–99)
Potassium: 4.5 mmol/L (ref 3.5–5.1)
Sodium: 140 mmol/L (ref 135–145)

## 2018-08-26 LAB — CBC
HCT: 24.9 % — ABNORMAL LOW (ref 39.0–52.0)
Hemoglobin: 7.7 g/dL — ABNORMAL LOW (ref 13.0–17.0)
MCH: 30 pg (ref 26.0–34.0)
MCHC: 30.9 g/dL (ref 30.0–36.0)
MCV: 96.9 fL (ref 80.0–100.0)
Platelets: 119 10*3/uL — ABNORMAL LOW (ref 150–400)
RBC: 2.57 MIL/uL — ABNORMAL LOW (ref 4.22–5.81)
RDW: 14.2 % (ref 11.5–15.5)
WBC: 7.2 10*3/uL (ref 4.0–10.5)
nRBC: 0 % (ref 0.0–0.2)

## 2018-08-26 MED ORDER — AMLODIPINE BESYLATE 5 MG PO TABS: 10.0000 mg | ORAL_TABLET | Freq: Every day | ORAL | 10 refills | Status: AC

## 2018-08-26 MED ORDER — ALUM & MAG HYDROXIDE-SIMETH 200-200-20 MG/5ML PO SUSP: 30.0000 mL | ORAL | 0 refills | Status: DC | PRN

## 2018-08-26 MED ORDER — ENSURE SURGERY PO LIQD: 237.0000 mL | Freq: Two times a day (BID) | ORAL | Status: DC

## 2018-08-26 MED ORDER — AMLODIPINE BESYLATE 5 MG PO TABS: 5.0000 mg | ORAL_TABLET | Freq: Every day | ORAL | Status: DC

## 2018-08-26 NOTE — Progress Notes (Addendum)
PT Cancellation Note  Patient Details Name: Joshua Stark MRN: [id number] DOB: 1945/05/13   Cancelled Treatment:    Reason Eval/Treat Not Completed: Other (comment). Pt politely declined PT due to pending d/c from hospital.  He reports that family has built ramp for entry into the house.  Reviewed PWB status.  Will check back later in case d/c is canceled.  Addendum: Checked on pt later and he was with nursing staff about to get dressed for d/c.  Galen Manila 08/26/2018, 11:29 AM

## 2018-08-26 NOTE — TOC Initial Note (Signed)
Transition of Care [facility]) - Initial/Assessment Note    Patient Details  Name: Joshua Stark MRN: [id number] Date of Birth: [birth year]  Transition of Care [facility]) CM/SW Contact:    Joaquin Courts, RN Phone Number: 08/26/2018, 12:50 PM  Clinical Narrative:       CM spoke with patient. Patient set up with [facility] for HHPT and RN.              Expected Discharge Plan: [place] Barriers to Discharge: No Barriers Identified   Patient Goals and CMS Choice Patient states their goals for this hospitalization and ongoing recovery are:: to go home CMS Medicare.gov Compare Post Acute Care list provided to:: Patient Choice offered to / list presented to : Patient  Expected Discharge Plan and Services Expected Discharge Plan: [place]   Discharge Planning Services: CM Consult Post Acute Care Choice: [place] arrangements for the past 2 months: Single Family Home Expected Discharge Date: 08/26/18               DME Arranged: N/A DME Agency: NA       HH Arranged: PT, RN [place] Agency: [place] Date [place]: 08/26/18 Time HH Agency Contacted: 61 Representative spoke with at [place]: Malachy Mood rose  Prior Living Arrangements/Services Living arrangements for the past 2 months: [place] with:: Self Patient language and need for interpreter reviewed:: Yes Do you feel safe going back to the place where you live?: Yes      Need for Family Participation in Patient Care: Yes (Comment) Care giver support system in place?: Yes (comment)   Criminal Activity/Legal Involvement Pertinent to Current Situation/Hospitalization: No - Comment as needed  Activities of Daily Living Home Assistive Devices/Equipment: None ADL Screening (condition at time of admission) Patient's cognitive ability adequate to safely complete daily activities?: Yes Is the patient deaf or have difficulty hearing?:  No Does the patient have difficulty seeing, even when wearing glasses/contacts?: No Does the patient have difficulty concentrating, remembering, or making decisions?: No Patient able to express need for assistance with ADLs?: No Does the patient have difficulty dressing or bathing?: No Independently performs ADLs?: Yes (appropriate for developmental age) Does the patient have difficulty walking or climbing stairs?: Yes Weakness of Legs: Left Weakness of Arms/Hands: None  Permission Sought/Granted                  Emotional Assessment Appearance:: Appears stated age Attitude/Demeanor/Rapport: Engaged Affect (typically observed): Accepting Orientation: : Oriented to Place, Oriented to  Time, Oriented to Situation, Oriented to Self   Psych Involvement: No (comment)  Admission diagnosis:  Pain [R52] Closed fracture of shaft of left femur, unspecified fracture morphology, initial encounter [facility]) [S72.302A] Patient Active Problem List   Diagnosis Date Noted  . Femur fracture, left ([place]) 08/22/2018  . S/P left TKA 08/17/2016  . S/P total knee replacement 08/17/2016  . Bradycardia 03/19/2014  . Aortic ejection murmur 09/18/2013  . CKD (chronic kidney disease) stage 3, GFR 30-59 ml/min (HCC) 05/01/2013  . CAD S/P percutaneous coronary angioplasty   . Essential hypertension   . Hyperlipidemia with target LDL less than 70   . Class 1 obesity with serious comorbidity and body mass index (BMI) of 30.0 to 30.9 in adult   . Hyperkalemia 07/27/2011  . Low testosterone in male 07/30/2009  . Anemia of chronic disease 07/29/2009  . CONSTIPATION 07/07/2009  . ABDOMINAL PAIN, LEFT LOWER  QUADRANT 07/07/2009  . Seizure disorder ([place]) 10/13/2006   PCP:  Jinny Sanders, MD Pharmacy:   [place], [place], [place] [address], [place] [address] Phone: [phone] Fax: [phone]  [id number] [place], [address] [place] [address] Phone: [phone] Fax: [phone]     Social Determinants of Health (SDOH) Interventions    Readmission Risk Interventions No flowsheet data found.

## 2018-08-26 NOTE — Progress Notes (Signed)
    Subjective:  Patient reports pain as mild to moderate.  Denies N/V/CP/SOB. No c/o.  Objective:   VITALS:   Vitals:   08/25/18 0538 08/25/18 1327 08/25/18 2051 08/26/18 0550  BP: (!) 136/58 (!) 155/73 (!) 141/88 (!) 154/63  Pulse: 78 77 82 83  Resp: 16 19 12 14   Temp: 97.6 F (36.4 C) 98.7 F (37.1 C) 98.9 F (37.2 C) 98.7 F (37.1 C)  TempSrc: Oral Oral Oral Oral  SpO2: 97% 96% 97% 96%  Weight:      Height:        NAD ABD soft Sensation intact distally Intact pulses distally Dorsiflexion/Plantar flexion intact Incision: dressing C/D/I Compartment soft   Lab Results  Component Value Date   WBC 8.3 08/25/2018   HGB 7.1 (L) 08/25/2018   HCT 22.6 (L) 08/25/2018   MCV 95.8 08/25/2018   PLT 106 (L) 08/25/2018   BMET    Component Value Date/Time   NA 140 08/26/2018 0605   K 4.5 08/26/2018 0605   CL 109 08/26/2018 0605   CO2 24 08/26/2018 0605   GLUCOSE 102 (H) 08/26/2018 0605   BUN 46 (H) 08/26/2018 0605   CREATININE 1.28 (H) 08/26/2018 0605   CALCIUM 8.4 (L) 08/26/2018 0605   GFRNONAA 56 (L) 08/26/2018 0605   GFRAA >60 08/26/2018 0605     Assessment/Plan: 3 Days Post-Op   Principal Problem:   Femur fracture, left (HCC) Active Problems:   Anemia of chronic disease   Seizure disorder (HCC)   Hyperkalemia   CAD S/P percutaneous coronary angioplasty   Essential hypertension   CKD (chronic kidney disease) stage 3, GFR 30-59 ml/min (HCC)   TDWB with walker DVT ppx: Aspirin, SCDs, TEDS PO pain control PT/OT Dispo: D/C home when ok with hospitalist   Joshua Stark Joshua Stark 08/26/2018, 9:39 AM   Rod Can, MD Cell: [phone] [place] is now [facility]  [place] [address], [address], [place], [place] [address] Phone: [phone] [email or web address] Facebook  [employer]

## 2018-08-26 NOTE — Plan of Care (Signed)
  Problem: Clinical Measurements: Goal: Will remain free from infection Outcome: Progressing   Problem: Clinical Measurements: Goal: Respiratory complications will improve Outcome: Progressing   Problem: Clinical Measurements: Goal: Cardiovascular complication will be avoided Outcome: Progressing   Problem: Coping: Goal: Level of anxiety will decrease Outcome: Progressing   Problem: Elimination: Goal: Will not experience complications related to urinary retention Outcome: Progressing   Problem: Health Behavior/Discharge Planning: Goal: Ability to manage health-related needs will improve Outcome: Adequate for Discharge   Problem: Clinical Measurements: Goal: Ability to maintain clinical measurements within normal limits will improve Outcome: Adequate for Discharge   Problem: Activity: Goal: Risk for activity intolerance will decrease Outcome: Adequate for Discharge   Problem: Nutrition: Goal: Adequate nutrition will be maintained Outcome: Adequate for Discharge   Problem: Elimination: Goal: Will not experience complications related to bowel motility Outcome: Adequate for Discharge   Problem: Pain Managment: Goal: General experience of comfort will improve Outcome: Adequate for Discharge   Problem: Skin Integrity: Goal: Risk for impaired skin integrity will decrease Outcome: Adequate for Discharge

## 2018-08-26 NOTE — Progress Notes (Signed)
    Home health agencies that serve [id number].        Home Health Agencies Search Results  Results List Table  Home Health Agency Information Quality of Patient Care Rating Patient Survey Summary Rating  [facility] [phone] 3 out of 5 stars 5 out of 5 stars  [facility] [phone] 3 out of 5 stars 4 out of 5 stars  [facility] [phone] 4  out of 5 stars 3 out of 5 stars  [facility] [phone] 4  out of 5 stars 4 out of 5 stars  [facility] [phone] 4 out of 5 stars 4 out of 5 stars  [facility] [phone] 4 out of 5 stars 4 out of 5 stars  [facility] [phone] 3 out of 5 stars 3 out of 5 stars  [facility] [phone] 3  out of 5 stars 4 out of 5 stars  [facility] [phone] 3 out of 5 stars 4 out of 5 stars  [facility] [phone] 3  out of 5 stars 3 out of 5 stars  [facility] [phone] 4 out of 5 stars 5 out of 5 stars  [facility] [phone] 3  out of 5 stars 4 out of 5 stars  [facility] [phone] 2  out of 5 stars 4 out of 5 stars  [facility] [phone] 3 out of 5 stars 4 out of 5 stars  [facility] [phone] Not Available4 Not Available12  [facility] [phone] 2  out of 5 stars 4 out of 5 stars  [facility] [phone] 4  out of 5 stars 3 out of 5 stars  [facility] [phone] 4  out of 5 stars 2 out of 5 stars   Home Health Footnotes  Footnote number Footnote as displayed on Home Health Compare  1 This agency provides services under a federal waiver program to non-traditional, chronic long term population.  2 This agency provides services to a special needs population.  3 Not Available.  4 The number of patient episodes for this measure is too small to  report.  5 This measure currently does not have data or provider has been certified/recertified for less than 6 months.  6 The national average for this measure is not provided because of state-to-state differences in data collection.  7 Medicare is not displaying rates for this measure for any home health agency, because of an issue with the data.  8 There were problems with the data and they are being corrected.  9 Zero, or very few, patients met the survey's rules for inclusion. The scores shown, if any, reflect a very small number of surveys and may not accurately tell how an agency is doing.  10 Survey results are based on less than 12 months of data.  11 Fewer than 70 patients completed the survey. Use the scores shown, if any, with caution as the number of surveys may be too low to accurately tell how an agency is doing.  12 No survey results are available for this period.  13 Data suppressed by CMS for one or more quarters.    

## 2018-08-27 LAB — BPAM RBC
Blood Product Expiration Date: 202008232359
Blood Product Expiration Date: 202008252359
ISSUE DATE / TIME: 202007291951
Unit Type and Rh: 6200
Unit Type and Rh: 6200

## 2018-08-27 LAB — TYPE AND SCREEN
ABO/RH(D): A POS
Antibody Screen: NEGATIVE
Unit division: 0
Unit division: 0

## 2018-08-27 NOTE — Discharge Summary (Signed)
Physician Discharge Summary  Joshua Stark:[id number] DOB: Apr 14, 1945 DOA: 08/22/2018  PCP: Joshua Sanders, MD  Admit date: 08/22/2018 Discharge date: 08/26/2018  Admitted From: Home.  Disposition:  Home.   Recommendations for Outpatient Follow-up:  1. Follow up with PCP in 1-2 weeks 2. Please obtain BMP/CBC in one week Please follow up with orthopedics as recommended.  Please follow upw ith home health PT on discharge.   Discharge Condition:stable.  CODE STATUS:full code.  Diet recommendation: Heart Healthy / Carb Modified  Brief/Interim Summary: Joshua Speyer Sasseris a 73 y.o.malewith medical history significant forcoronary artery disease, hypertension, seizure disorder, and chronic kidney disease stage III, now presenting to the emergency department with left knee pain after a fall at home.  Discharge Diagnoses:  Principal Problem:   Femur fracture, left (HCC) Active Problems:   Anemia of chronic disease   Seizure disorder (HCC)   Hyperkalemia   CAD S/P percutaneous coronary angioplasty   Essential hypertension   CKD (chronic kidney disease) stage 3, GFR 30-59 ml/min (HCC)   Left femur fracture following a mechanical fall X-ray showing mid distal left femur fracture. Orthopedic consulted and underwent ORIF. PT recommended home health PT.  pain control.   Hyperkalemia with  Acute on stage III CKD. Possibly from mild rhabdomyolysis, AKI and ACE inhibitor.  Hydrated and stopped the nephrotoxins.  UA neg for infection and US RENAL does not show any obstruction.  Potassium wnl on discharge   Hypertension Well-controlled.   History of coronary artery disease Patient denies any chest pain at this time.  holding ace inhibitor for hyperkalemia on admission.     Anemia of chronic disease/ acute anemia from blood loss from surgery:  Continue to monitor hemoglobin., s/p 1 unit of prbc transfusion.  Hemoglobin 7.1 this am. On iron supplementation.      Seizure disorder Continue with Depakote   Mild rhabdomyolysis:  Hydrated.   Discharge Instructions  Discharge Instructions    Diet - low sodium heart healthy   Complete by: As directed    Discharge instructions   Complete by: As directed    PLEASE FOLLOW UP WITH ORTHOPEDICS AS RECOMMENDED.  Please follow up with PCP in 1 to 2 weeks and get cbc checked.   Partial weight bearing   Complete by: As directed    % Body Weight: 50   Laterality: left   Extremity: Lower     Allergies as of 08/26/2018      Reactions   Fenofibrate Other (See Comments)   Arthritis, cramping   Rosuvastatin Other (See Comments)   REACTION: cramps   Pravastatin Sodium Other (See Comments)   REACTION: cramps   Statins Other (See Comments)   Cramping      Medication List    STOP taking these medications   aspirin EC 81 MG tablet Replaced by: aspirin 81 MG chewable tablet   eplerenone 25 MG tablet Commonly known as: INSPRA   lisinopril 40 MG tablet Commonly known as: ZESTRIL     TAKE these medications   acetaminophen 500 MG tablet Commonly known as: TYLENOL Take 2 tablets (1,000 mg total) by mouth every 8 (eight) hours.   alum & mag hydroxide-simeth 200-200-20 MG/5ML suspension Commonly known as: MAALOX/MYLANTA Take 30 mLs by mouth every 4 (four) hours as needed for indigestion.   amLODipine 5 MG tablet Commonly known as: NORVASC Take 2 tablets (10 mg total) by mouth daily. What changed: how much to take   aspirin 81 MG chewable tablet Commonly  known as: Aspirin Childrens Chew 1 tablet (81 mg total) by mouth 2 (two) times daily. Take for 4 weeks, then resume regular dose. Replaces: aspirin EC 81 MG tablet   Depakote 250 MG DR tablet Generic drug: divalproex TAKE 1 TABLET BY MOUTH TWICE A DAY What changed: how much to take   docusate sodium 100 MG capsule Commonly known as: Colace Take 1 capsule (100 mg total) by mouth 2 (two) times daily.   Evolocumab with Infusor  420 MG/3.5ML Soct Inject 3.5 mLs into the skin every 30 (thirty) days.   feeding supplement Liqd Take 237 mLs by mouth 2 (two) times daily between meals.   ferrous sulfate 325 (65 FE) MG tablet Commonly known as: FerrouSul Take 1 tablet (325 mg total) by mouth 3 (three) times daily with meals for 14 days.   Garlic 161 MG Tabs Take 500 mg by mouth daily.   methocarbamol 500 MG tablet Commonly known as: Robaxin Take 1 tablet (500 mg total) by mouth every 6 (six) hours as needed for muscle spasms.   multivitamin capsule Take 1 capsule by mouth daily.   Nitrostat 0.4 MG SL tablet Generic drug: nitroGLYCERIN DISSOLVE 1 TABLET UNDER THE TONGUE FOR CHEST PAIN. MAY REPEAT EVERY 5MINUTES UP TO 3 DOSES. IF NO RELIEF, CALL 911** What changed: See the new instructions.   oxyCODONE 5 MG immediate release tablet Commonly known as: Oxy IR/ROXICODONE Take 1-2 tablets (5-10 mg total) by mouth every 4 (four) hours as needed for moderate pain or severe pain.   polyethylene glycol 17 g packet Commonly known as: MIRALAX / GLYCOLAX Take 17 g by mouth 2 (two) times daily.            Discharge Care Instructions  (From admission, onward)         Start     Ordered   08/25/18 0000  Partial weight bearing    Question Answer Comment  % Body Weight 50   Laterality left   Extremity Lower      08/25/18 0810         Follow-up Information    Joshua Sanders, MD. Schedule an appointment as soon as possible for a visit in 1 week(s).   Specialty: Family Medicine Contact information: [place] [address] [phone]        [facility] [place] Follow up.   Why: agency will provide home health physical therapy and nurse. agency will call to schedule first visit. Contact information: [place] [address] [phone]          Allergies  Allergen Reactions  . Fenofibrate Other (See Comments)    Arthritis, cramping   .  Rosuvastatin Other (See Comments)    REACTION: cramps   . Pravastatin Sodium Other (See Comments)    REACTION: cramps  . Statins Other (See Comments)    Cramping     Consultations:  Orthopedics.    Procedures/Studies: US Renal  Result Date: 08/24/2018 CLINICAL DATA:  Acute kidney injury EXAM: RENAL / URINARY TRACT ULTRASOUND COMPLETE COMPARISON:  December 03, 2015 FINDINGS: Right Kidney: Renal measurements: 10.6 x 4.5 x 5.0 cm = volume: 125 mL. There appears to be slight cortical thinning with increased echogenicity. A tiny anechoic cyst seen within the midpole measuring 1.7 cm. Left Kidney: Renal measurements: 10.9 x 4.4 x 5.0 cm = volume: 126 mL. There appears to be slight cortical thinning and increased echogenicity. No renal masses seen. Bladder: The bladder  is partially distended. IMPRESSION: No acute kidney or bladder abnormality. Electronically Signed   By: Prudencio Pair M.D.   On: 08/24/2018 20:41   Dg Chest Port 1 View  Result Date: 08/22/2018 CLINICAL DATA:  Preop for femur fracture EXAM: PORTABLE CHEST 1 VIEW COMPARISON:  None. FINDINGS: The heart size and mediastinal contours are within normal limits. Both lungs are clear. The visualized skeletal structures are unremarkable. IMPRESSION: No acute cardiopulmonary process. Electronically Signed   By: Prudencio Pair M.D.   On: 08/22/2018 23:40   Dg C-arm 1-60 Min-no Report  Result Date: 08/23/2018 CLINICAL DATA:  IM nail left femur. EXAM: LEFT FEMUR 2 VIEWS; DG C-ARM 1-60 MIN-NO REPORT COMPARISON:  Preoperative radiograph yesterday. FINDINGS: Six fluoroscopic spot images obtained in the operating room. Intramedullary nail with cerclage wire and locking screw fixation traverse midshaft femoral fracture. Fracture is in improved alignment compared to preoperative imaging. Total knee arthroplasty is partially included. Total fluoroscopy time 1 minutes 18 seconds. Total dose 17.0 mGy. IMPRESSION: Intraoperative fluoroscopy during left femur  fracture ORIF. Electronically Signed   By: Keith Rake M.D.   On: 08/23/2018 20:36   Dg Femur Min 2 Views Left  Result Date: 08/23/2018 CLINICAL DATA:  IM nail left femur. EXAM: LEFT FEMUR 2 VIEWS; DG C-ARM 1-60 MIN-NO REPORT COMPARISON:  Preoperative radiograph yesterday. FINDINGS: Six fluoroscopic spot images obtained in the operating room. Intramedullary nail with cerclage wire and locking screw fixation traverse midshaft femoral fracture. Fracture is in improved alignment compared to preoperative imaging. Total knee arthroplasty is partially included. Total fluoroscopy time 1 minutes 18 seconds. Total dose 17.0 mGy. IMPRESSION: Intraoperative fluoroscopy during left femur fracture ORIF. Electronically Signed   By: Keith Rake M.D.   On: 08/23/2018 20:36   Dg Femur Min 2 Views Left  Result Date: 08/22/2018 CLINICAL DATA:  Patient tripped and fell EXAM: LEFT FEMUR 2 VIEWS COMPARISON:  None. FINDINGS: There is obliquely oriented comminuted fracture of the midshaft of the femur with medial angulation and overlap of fracture fragments. The fracture line extends to the distal femur, however the total knee arthroplasty appears to be intact. There is overlying soft tissue swelling. IMPRESSION: Comminuted obliquely oriented fracture seen through the mid to distal femur. Left total knee arthroplasty which appears to be intact. Electronically Signed   By: Prudencio Pair M.D.   On: 08/22/2018 22:42       Subjective: No chest pain or sob   Discharge Exam: Vitals:   08/25/18 2051 08/26/18 0550  BP: (!) 141/88 (!) 154/63  Pulse: 82 83  Resp: 12 14  Temp: 98.9 F (37.2 C) 98.7 F (37.1 C)  SpO2: 97% 96%   Vitals:   08/25/18 0538 08/25/18 1327 08/25/18 2051 08/26/18 0550  BP: (!) 136/58 (!) 155/73 (!) 141/88 (!) 154/63  Pulse: 78 77 82 83  Resp: 16 19 12 14   Temp: 97.6 F (36.4 C) 98.7 F (37.1 C) 98.9 F (37.2 C) 98.7 F (37.1 C)  TempSrc: Oral Oral Oral Oral  SpO2: 97% 96% 97% 96%   Weight:      Height:        General: Pt is alert, awake, not in acute distress Cardiovascular: RRR, S1/S2 +, no rubs, no gallops Respiratory: CTA bilaterally, no wheezing, no rhonchi Abdominal: Soft, NT, ND, bowel sounds + Extremities: no edema, no cyanosis    The results of significant diagnostics from this hospitalization (including imaging, microbiology, ancillary and laboratory) are listed below for reference.  Microbiology: Recent Results (from the past 240 hour(s))  SARS Coronavirus 2 (CEPHEID - Performed in [facility] hospital lab), Hosp Order     Status: None   Collection Time: 08/22/18  9:53 PM   Specimen: Nasopharyngeal Swab  Result Value Ref Range Status   SARS Coronavirus 2 NEGATIVE NEGATIVE Final    Comment: (NOTE) If result is NEGATIVE SARS-CoV-2 target nucleic acids are NOT DETECTED. The SARS-CoV-2 RNA is generally detectable in upper and lower  respiratory specimens during the acute phase of infection. The lowest  concentration of SARS-CoV-2 viral copies this assay can detect is 250  copies / mL. A negative result does not preclude SARS-CoV-2 infection  and should not be used as the sole basis for treatment or other  patient management decisions.  A negative result may occur with  improper specimen collection / handling, submission of specimen other  than nasopharyngeal swab, presence of viral mutation(s) within the  areas targeted by this assay, and inadequate number of viral copies  (<250 copies / mL). A negative result must be combined with clinical  observations, patient history, and epidemiological information. If result is POSITIVE SARS-CoV-2 target nucleic acids are DETECTED. The SARS-CoV-2 RNA is generally detectable in upper and lower  respiratory specimens dur ing the acute phase of infection.  Positive  results are indicative of active infection with SARS-CoV-2.  Clinical  correlation with patient history and other diagnostic information is   necessary to determine patient infection status.  Positive results do  not rule out bacterial infection or co-infection with other viruses. If result is PRESUMPTIVE POSTIVE SARS-CoV-2 nucleic acids MAY BE PRESENT.   A presumptive positive result was obtained on the submitted specimen  and confirmed on repeat testing.  While 2019 novel coronavirus  (SARS-CoV-2) nucleic acids may be present in the submitted sample  additional confirmatory testing may be necessary for epidemiological  and / or clinical management purposes  to differentiate between  SARS-CoV-2 and other Sarbecovirus currently known to infect humans.  If clinically indicated additional testing with an alternate test  methodology [phone]) is advised. The SARS-CoV-2 RNA is generally  detectable in upper and lower respiratory sp ecimens during the acute  phase of infection. The expected result is Negative. Fact Sheet for Patients:  StrictlyIdeas.no Fact Sheet for Healthcare Providers: BankingDealers.co.za This test is not yet approved or cleared by the [place] FDA and has been authorized for detection and/or diagnosis of SARS-CoV-2 by FDA under an Emergency Use Authorization (EUA).  This EUA will remain in effect (meaning this test can be used) for the duration of the COVID-19 declaration under Section 564(b)(1) of the Act, 21 U.S.C. section 360bbb-3(b)(1), unless the authorization is terminated or revoked sooner. Performed at [facility], [place] [address] [place], [place] [address]   Surgical PCR screen     Status: None   Collection Time: 08/23/18  2:03 AM   Specimen: Nasal Mucosa; Nasal Swab  Result Value Ref Range Status   MRSA, PCR NEGATIVE NEGATIVE Final   Staphylococcus aureus NEGATIVE NEGATIVE Final    Comment: (NOTE) The Xpert SA Assay (FDA approved for NASAL specimens in patients 23 years of age and older), is one component of a  comprehensive surveillance program. It is not intended to diagnose infection nor to guide or monitor treatment. Performed at [facility], [place] [address] [place], [place] [address]      Labs: BNP (last 3 results) No results for input(s): BNP in the last  8760 hours. Basic Metabolic Panel: Recent Labs  Lab 08/23/18 0213  08/23/18 1938  08/24/18 0537  08/24/18 1841 08/25/18 0006 08/25/18 0526 08/25/18 1527 08/26/18 0605  NA 137  --  140  --  137  --   --   --  137 138 140  K 5.5*   < > 5.2*   < > 6.0*   < > 5.6* 5.7* 5.6* 5.0 4.5  CL 112*  --   --   --  110  --   --   --  111 110 109  CO2 19*  --   --   --  19*  --   --   --  20* 21* 24  GLUCOSE 148*  --  126*  --  178*  --   --   --  97 119* 102*  BUN 38*  --   --   --  49*  --   --   --  53* 54* 46*  CREATININE 1.27*  --   --   --  1.98*  --   --   --  1.64* 1.55* 1.28*  CALCIUM 8.6*  --   --   --  8.4*  --   --   --  8.1* 8.3* 8.4*   < > = values in this interval not displayed.   Liver Function Tests: Recent Labs  Lab 08/22/18 2153  AST 21  ALT 19  ALKPHOS 62  BILITOT 0.4  PROT 6.2*  ALBUMIN 3.6   No results for input(s): LIPASE, AMYLASE in the last 168 hours. No results for input(s): AMMONIA in the last 168 hours. CBC: Recent Labs  Lab 08/22/18 2153 08/23/18 0213  08/23/18 2119 08/24/18 0537 08/25/18 0526 08/26/18 0605 08/26/18 1026  WBC 11.3* 8.0  --   --  7.4 8.3 7.2 6.2  NEUTROABS 9.2*  --   --   --   --   --   --  3.8  HGB 10.9* 10.5*   < > 9.3* 8.8* 7.1* 7.7* 7.0*  HCT 34.2* 32.7*   < > 30.5* 28.8* 22.6* 24.9* 22.6*  MCV 95.8 95.6  --   --  97.0 95.8 96.9 96.6  PLT 184 159  --   --  110* 106* 119* 104*   < > = values in this interval not displayed.   Cardiac Enzymes: Recent Labs  Lab 08/24/18 0537 08/25/18 0526  CKTOTAL 798* 1,177*   BNP: Invalid input(s): POCBNP CBG: No results for input(s): GLUCAP in the last 168 hours. D-Dimer No results for input(s): DDIMER in  the last 72 hours. Hgb A1c No results for input(s): HGBA1C in the last 72 hours. Lipid Profile No results for input(s): CHOL, HDL, LDLCALC, TRIG, CHOLHDL, LDLDIRECT in the last 72 hours. Thyroid function studies No results for input(s): TSH, T4TOTAL, T3FREE, THYROIDAB in the last 72 hours.  Invalid input(s): FREET3 Anemia work up No results for input(s): VITAMINB12, FOLATE, FERRITIN, TIBC, IRON, RETICCTPCT in the last 72 hours. Urinalysis    Component Value Date/Time   COLORURINE YELLOW 08/24/2018 1551   APPEARANCEUR CLEAR 08/24/2018 1551   LABSPEC 1.017 08/24/2018 1551   PHURINE 5.0 08/24/2018 1551   GLUCOSEU NEGATIVE 08/24/2018 1551   HGBUR SMALL (A) 08/24/2018 1551   HGBUR negative 07/07/2009 1144   BILIRUBINUR NEGATIVE 08/24/2018 [place] 08/24/2018 1551   PROTEINUR NEGATIVE 08/24/2018 1551   UROBILINOGEN 0.2 07/07/2009 1144   NITRITE NEGATIVE 08/24/2018 1551   LEUKOCYTESUR  NEGATIVE 08/24/2018 1551   Sepsis Labs Invalid input(s): PROCALCITONIN,  WBC,  LACTICIDVEN Microbiology Recent Results (from the past 240 hour(s))  SARS Coronavirus 2 (CEPHEID - Performed in [facility] hospital lab), Hosp Order     Status: None   Collection Time: 08/22/18  9:53 PM   Specimen: Nasopharyngeal Swab  Result Value Ref Range Status   SARS Coronavirus 2 NEGATIVE NEGATIVE Final    Comment: (NOTE) If result is NEGATIVE SARS-CoV-2 target nucleic acids are NOT DETECTED. The SARS-CoV-2 RNA is generally detectable in upper and lower  respiratory specimens during the acute phase of infection. The lowest  concentration of SARS-CoV-2 viral copies this assay can detect is 250  copies / mL. A negative result does not preclude SARS-CoV-2 infection  and should not be used as the sole basis for treatment or other  patient management decisions.  A negative result may occur with  improper specimen collection / handling, submission of specimen other  than nasopharyngeal swab, presence  of viral mutation(s) within the  areas targeted by this assay, and inadequate number of viral copies  (<250 copies / mL). A negative result must be combined with clinical  observations, patient history, and epidemiological information. If result is POSITIVE SARS-CoV-2 target nucleic acids are DETECTED. The SARS-CoV-2 RNA is generally detectable in upper and lower  respiratory specimens dur ing the acute phase of infection.  Positive  results are indicative of active infection with SARS-CoV-2.  Clinical  correlation with patient history and other diagnostic information is  necessary to determine patient infection status.  Positive results do  not rule out bacterial infection or co-infection with other viruses. If result is PRESUMPTIVE POSTIVE SARS-CoV-2 nucleic acids MAY BE PRESENT.   A presumptive positive result was obtained on the submitted specimen  and confirmed on repeat testing.  While 2019 novel coronavirus  (SARS-CoV-2) nucleic acids may be present in the submitted sample  additional confirmatory testing may be necessary for epidemiological  and / or clinical management purposes  to differentiate between  SARS-CoV-2 and other Sarbecovirus currently known to infect humans.  If clinically indicated additional testing with an alternate test  methodology [phone]) is advised. The SARS-CoV-2 RNA is generally  detectable in upper and lower respiratory sp ecimens during the acute  phase of infection. The expected result is Negative. Fact Sheet for Patients:  StrictlyIdeas.no Fact Sheet for Healthcare Providers: BankingDealers.co.za This test is not yet approved or cleared by the [place] FDA and has been authorized for detection and/or diagnosis of SARS-CoV-2 by FDA under an Emergency Use Authorization (EUA).  This EUA will remain in effect (meaning this test can be used) for the duration of the COVID-19 declaration under Section  564(b)(1) of the Act, 21 U.S.C. section 360bbb-3(b)(1), unless the authorization is terminated or revoked sooner. Performed at [facility], [place] [address], [place], [place] [address]   Surgical PCR screen     Status: None   Collection Time: 08/23/18  2:03 AM   Specimen: Nasal Mucosa; Nasal Swab  Result Value Ref Range Status   MRSA, PCR NEGATIVE NEGATIVE Final   Staphylococcus aureus NEGATIVE NEGATIVE Final    Comment: (NOTE) The Xpert SA Assay (FDA approved for NASAL specimens in patients 19 years of age and older), is one component of a comprehensive surveillance program. It is not intended to diagnose infection nor to guide or monitor treatment. Performed at [facility], [place] [address] [place], [place] [address]  Time coordinating discharge: 34 minutes  SIGNED:   Hosie Poisson, MD  [facility] 08/27/2018, 8:47 AM Pager   If 7PM-7AM, please contact night-coverage [email or web address] Password [email or web address]

## 2018-08-28 ENCOUNTER — Telehealth: Payer: Self-pay

## 2018-08-28 DIAGNOSIS — D638 Anemia in other chronic diseases classified elsewhere: Secondary | ICD-10-CM

## 2018-08-28 DIAGNOSIS — N183 Chronic kidney disease, stage 3 unspecified: Secondary | ICD-10-CM

## 2018-08-28 NOTE — Telephone Encounter (Signed)
Transition Care Management Follow-up Telephone Call   Date discharged? 08/26/2018   How have you been since you were released from the hospital? Not hurting right now. Taking pain medications. Patient has been nauseas but daughter states he had this issue prior to the recent hospital visit. He is taking Zofran right now that was given by ortho.   Do you understand why you were in the hospital? yes   Do you understand the discharge instructions? yes   Where were you discharged to? home   Items Reviewed:  Medications reviewed: yes  Allergies reviewed: yes  Dietary changes reviewed: yes  Referrals reviewed: yes-Dr Arlington Calix has sent referral for PT to 3 different home health agency and they will go with whoever calls first and will let us know who it is.   Functional Questionnaire:   Activities of Daily Living (ADLs):   He states they are independent in the following: none at this time. States they require assistance with the following: ambulation, dressing, bathing, hygiene, toileting, fixing food.   Any transportation issues/concerns?: not able to leave the house right now due to healing from recent fracture/surgery   Any patient concerns? Patient needs to have BMP and CBC checked and daughter-Angie, would like to have an order for Home Health to draw this when they come out. Patient is not able to come into the office for follow up or labs. Can patient schedule virtual visit instead?   Confirmed importance and date/time of follow-up visits scheduled not at this time  Confirmed with patient if condition begins to worsen call PCP or go to the ER.  Patient was given the office number and encouraged to call back with question or concerns.  : yes

## 2018-08-28 NOTE — Telephone Encounter (Signed)
Please review patient concern section. Thank you

## 2018-08-29 NOTE — Telephone Encounter (Signed)
FYI   See other note.

## 2018-08-29 NOTE — Telephone Encounter (Addendum)
Noted concerns. ON 08/26/2018 kidney function had already improved dramatically. Hg was still low at 7 on 8/1. Got transfusion 8/3. If severe SOB.Marland Kitchen he needs to got to ER.  Will eval and discuss nausea, abd pain/GI referral and  further eval at upcoming OV.

## 2018-08-29 NOTE — Telephone Encounter (Signed)
Daughter Angie returned call from the office.   Angie's C/B # [phone]

## 2018-08-29 NOTE — Telephone Encounter (Signed)
Okay to give verbal orders to home health to draw CBC and BMET 1 week after discharge. Let me know f this needs to be in writing. Okay to do virtual OV, pt with femur fracture.

## 2018-08-29 NOTE — Telephone Encounter (Signed)
Spoke with Angie.  She is concerned about Mr. Montanari because he is complaining of nausea and SOB.  She states his hgb was 7.0 g/dl at discharge.  Mr. Minkin did have blood transfusions while in the hospital. She states before he went in for surgery his hgb was 10 g/dl. She also is concerned that his BUN and Creatinine was so elevated while he was in the hospital.  Lisinopril and Eplerenone were both stopped while patient was in the hospital.  She states her brother is sending a message to his cardiologist as well.  Janace Hoard is wondering if patient may need to see a GI doctor. They still do not know which home health agency will be coming out to care for Mr. Langham but she will send [place] a MyChart message as soon as she knows.  Mr. Gang is scheduled for a virtual hospital follow up appointment on 05/02/2018 at 2:20 pm with Dr. Diona Browner.  Please advise.

## 2018-08-29 NOTE — Telephone Encounter (Signed)
Spoke with Joshua Stark.   Home health has not been set up yet.  Ortho is still working on that.  I ask that they call us back with name of home health agency once everything is set up and I can give them orders for the labs.  Virtual Visit for hospital follow up scheduled for 09/01/2018 at 2:20 pm with Dr. Diona Browner.

## 2018-08-30 ENCOUNTER — Other Ambulatory Visit: Payer: Self-pay

## 2018-08-30 ENCOUNTER — Other Ambulatory Visit (INDEPENDENT_AMBULATORY_CARE_PROVIDER_SITE_OTHER): Payer: Medicare HMO

## 2018-08-30 ENCOUNTER — Telehealth: Payer: Self-pay

## 2018-08-30 DIAGNOSIS — N183 Chronic kidney disease, stage 3 unspecified: Secondary | ICD-10-CM

## 2018-08-30 DIAGNOSIS — D638 Anemia in other chronic diseases classified elsewhere: Secondary | ICD-10-CM

## 2018-08-30 LAB — CBC WITH DIFFERENTIAL/PLATELET
Basophils Absolute: 0 10*3/uL (ref 0.0–0.1)
Basophils Relative: 0.5 % (ref 0.0–3.0)
Eosinophils Absolute: 0.3 10*3/uL (ref 0.0–0.7)
Eosinophils Relative: 5.9 % — ABNORMAL HIGH (ref 0.0–5.0)
HCT: 20.8 % — CL (ref 39.0–52.0)
Hemoglobin: 7 g/dL — CL (ref 13.0–17.0)
Lymphocytes Relative: 26.5 % (ref 12.0–46.0)
Lymphs Abs: 1.6 10*3/uL (ref 0.7–4.0)
MCHC: 33.5 g/dL (ref 30.0–36.0)
MCV: 92.7 fl (ref 78.0–100.0)
Monocytes Absolute: 0.6 10*3/uL (ref 0.1–1.0)
Monocytes Relative: 10.4 % (ref 3.0–12.0)
Neutro Abs: 3.4 10*3/uL (ref 1.4–7.7)
Neutrophils Relative %: 56.7 % (ref 43.0–77.0)
Platelets: 207 10*3/uL (ref 150.0–400.0)
RBC: 2.24 Mil/uL — ABNORMAL LOW (ref 4.22–5.81)
RDW: 13.9 % (ref 11.5–15.5)
WBC: 5.9 10*3/uL (ref 4.0–10.5)

## 2018-08-30 LAB — BASIC METABOLIC PANEL
BUN: 28 mg/dL — ABNORMAL HIGH (ref 6–23)
CO2: 28 mEq/L (ref 19–32)
Calcium: 8.6 mg/dL (ref 8.4–10.5)
Chloride: 103 mEq/L (ref 96–112)
Creatinine, Ser: 1.24 mg/dL (ref 0.40–1.50)
GFR: 57.35 mL/min — ABNORMAL LOW (ref >60.00)
Glucose, Bld: 147 mg/dL — ABNORMAL HIGH (ref 70–99)
Potassium: 5.3 mEq/L — ABNORMAL HIGH (ref 3.5–5.1)
Sodium: 135 mEq/L (ref 135–145)

## 2018-08-30 NOTE — Telephone Encounter (Signed)
Angie notified as instructed by telephone.  She actually brought Mr. Arnette to office today to get his labs drawn since they are having such a hard time finding a [place] agency that is in their network.  They are going to use [facility] for time being and just pay out of pocket for.  Virtural visit is scheduled for Friday 08/01/2018 at 2:20 pm for hospital follow up and other issues will be addressed at that time.

## 2018-08-30 NOTE — Telephone Encounter (Signed)
[facility] called to report a critical lab result @ 1605  Hemoglobin 7 Hematocrit 20.8

## 2018-08-30 NOTE — Telephone Encounter (Signed)
Released to mychart

## 2018-08-30 NOTE — Telephone Encounter (Signed)
I spoke to Joshua Stark's wife.  There is no bleeding at all and Hgb 7 is exactly what he had with leaving the hospital.   Should be stable for outpatient f/u in 2 days.

## 2018-08-30 NOTE — Telephone Encounter (Signed)
Patient's Daughter Janace Hoard would like a copy of the patient's lab work for them to take to his doctor's at [facility].

## 2018-08-30 NOTE — Addendum Note (Signed)
Addended by: Carter Kitten on: 08/30/2018 12:03 PM   Modules accepted: Orders

## 2018-08-30 NOTE — Telephone Encounter (Signed)
Angie called back and stated that they would like to use the home health facility listed below.   [facility] home [facility] [phone]  Phone- [phone]

## 2018-08-30 NOTE — Telephone Encounter (Signed)
Spoke with Angie to let her know that [facility] is on Epic and the doctor there should be able to pull the lab results from [place] to review.  She states that are now on his MyChart so they will just show him the results from there.

## 2018-08-31 ENCOUNTER — Telehealth: Payer: Self-pay

## 2018-08-31 DIAGNOSIS — D649 Anemia, unspecified: Secondary | ICD-10-CM | POA: Diagnosis not present

## 2018-08-31 DIAGNOSIS — I2510 Atherosclerotic heart disease of native coronary artery without angina pectoris: Secondary | ICD-10-CM | POA: Diagnosis not present

## 2018-08-31 DIAGNOSIS — R1013 Epigastric pain: Secondary | ICD-10-CM | POA: Diagnosis not present

## 2018-08-31 DIAGNOSIS — S72402D Unspecified fracture of lower end of left femur, subsequent encounter for closed fracture with routine healing: Secondary | ICD-10-CM | POA: Diagnosis not present

## 2018-08-31 DIAGNOSIS — D62 Acute posthemorrhagic anemia: Secondary | ICD-10-CM | POA: Diagnosis not present

## 2018-08-31 DIAGNOSIS — I4589 Other specified conduction disorders: Secondary | ICD-10-CM | POA: Diagnosis not present

## 2018-08-31 DIAGNOSIS — I129 Hypertensive chronic kidney disease with stage 1 through stage 4 chronic kidney disease, or unspecified chronic kidney disease: Secondary | ICD-10-CM | POA: Diagnosis not present

## 2018-08-31 DIAGNOSIS — R1032 Left lower quadrant pain: Secondary | ICD-10-CM | POA: Diagnosis not present

## 2018-08-31 DIAGNOSIS — K5730 Diverticulosis of large intestine without perforation or abscess without bleeding: Secondary | ICD-10-CM | POA: Diagnosis not present

## 2018-08-31 DIAGNOSIS — M25462 Effusion, left knee: Secondary | ICD-10-CM | POA: Diagnosis not present

## 2018-08-31 DIAGNOSIS — D509 Iron deficiency anemia, unspecified: Secondary | ICD-10-CM | POA: Diagnosis not present

## 2018-08-31 DIAGNOSIS — W1830XD Fall on same level, unspecified, subsequent encounter: Secondary | ICD-10-CM | POA: Diagnosis not present

## 2018-08-31 DIAGNOSIS — N183 Chronic kidney disease, stage 3 (moderate): Secondary | ICD-10-CM | POA: Diagnosis not present

## 2018-08-31 DIAGNOSIS — M7989 Other specified soft tissue disorders: Secondary | ICD-10-CM | POA: Diagnosis not present

## 2018-08-31 DIAGNOSIS — G40909 Epilepsy, unspecified, not intractable, without status epilepticus: Secondary | ICD-10-CM | POA: Diagnosis not present

## 2018-08-31 DIAGNOSIS — E782 Mixed hyperlipidemia: Secondary | ICD-10-CM | POA: Diagnosis not present

## 2018-08-31 NOTE — Telephone Encounter (Signed)
Patient's Daughter Janace Hoard is requesting a call back  C/B # [phone]

## 2018-08-31 NOTE — Telephone Encounter (Signed)
Agree with eval given SOB in setting of severe anemia and cardiac history. Anemia likely postop, but no improvement with recent transfusion. Pt need  Additional transfusion given cardiac history and we are unablke to set this up as outpatient in timely manner.

## 2018-08-31 NOTE — Telephone Encounter (Signed)
After speaking with Dr. Diona Browner, I did leave message for Dr. Silvestre Moment Cardiology and also for Orthopedic Surgeon, Dr. Alvan Dame ([facility]) who performed surgery in hospital to discuss further.  At this point, I have not heard back from their offices.  I was able to confirm with Short Stay that they will not schedule a same day transfusion (also this is likely not in best interest of patient) especially given the fact that patient is symptomatic and has cardiac hx with Hgb of 7.  patient really needs to go in through ER and be evaluated further per my discussion with PCP.    When speaking back with daughter, she goes on to inform me that patient is actually having black tarry stools in addition to his other ongoing symptoms since discharge last Saturday.  Dr. Diona Browner consulted and given patients history, state and condition, all in agreement that he needs to go in through ER to be evaluated further.  Patient's daughter to take him this afternoon.    Copied this message to Dr. Alvan Dame as an Juluis Rainier.

## 2018-08-31 NOTE — Telephone Encounter (Signed)
Patient's daughter calls and asks for guidance as to what they should do with patient.  His specialists at [facility] have called multiple times insisting that patient needs to return to the hospital for a blood transfusion and they are upset stating they don't know why we haven't done this already.   I reviewed with her that patient discharged from hospital with a hgb of 7 following a blood transfusion.  And, she has confirmed that patient has not had a decline in his symptoms or progression of symptoms. Daughter reports that he is not actively bleeding.  Dr. Lorelei Pont consulted about this yesterday (as PCP Dr. Diona Browner was out of the office) and given this info he felt patient was stable and safe to wait until eval with PCP on Friday.   Daughter states patient continues to have ongoing exertional SOB, nausea and L side pain which has persisted since his discharge but has not gotten any worse. In addition, patient denies any active signs of bleeding.   Patient has followup virtually with PCP tomorrow but daughter is confused and wants to know what they should do as [facility] insists on his return for transfusion today.   1.  Should patient go to the ER for further eval today?  Daughter does not want him to do this unless no other option because of exposure and she doesn't want him sitting around an ER for hours.   2.  Could a transfusion be done outpatient?  I explained likely not because he is symptomatic and in addition outpatient infusions typically take a few days to coordinate and aren't done emergently same day unless through the ER, but will discuss with PCP.    Please note patient's current VS:  BP 157/75, HR 70.   Daughter, Janace Hoard, states that [facility] specialists are insistent because they are concerned with his 'heart and kidney issues" that his hgb should be much higher than this and this places him at a much greater risk.

## 2018-09-01 ENCOUNTER — Ambulatory Visit: Payer: Medicare HMO | Admitting: Family Medicine

## 2018-09-01 ENCOUNTER — Other Ambulatory Visit: Payer: Self-pay

## 2018-09-01 DIAGNOSIS — D62 Acute posthemorrhagic anemia: Secondary | ICD-10-CM | POA: Diagnosis not present

## 2018-09-01 DIAGNOSIS — K5730 Diverticulosis of large intestine without perforation or abscess without bleeding: Secondary | ICD-10-CM | POA: Diagnosis not present

## 2018-09-01 DIAGNOSIS — N183 Chronic kidney disease, stage 3 (moderate): Secondary | ICD-10-CM | POA: Diagnosis not present

## 2018-09-01 DIAGNOSIS — S72402A Unspecified fracture of lower end of left femur, initial encounter for closed fracture: Secondary | ICD-10-CM | POA: Insufficient documentation

## 2018-09-01 DIAGNOSIS — M25462 Effusion, left knee: Secondary | ICD-10-CM | POA: Diagnosis not present

## 2018-09-01 DIAGNOSIS — I2510 Atherosclerotic heart disease of native coronary artery without angina pectoris: Secondary | ICD-10-CM | POA: Diagnosis not present

## 2018-09-01 DIAGNOSIS — I129 Hypertensive chronic kidney disease with stage 1 through stage 4 chronic kidney disease, or unspecified chronic kidney disease: Secondary | ICD-10-CM | POA: Diagnosis not present

## 2018-09-01 DIAGNOSIS — M7989 Other specified soft tissue disorders: Secondary | ICD-10-CM | POA: Diagnosis not present

## 2018-09-01 NOTE — Telephone Encounter (Signed)
See phone note from [place] on 08/31/2018.  Patient is currently admitted at [facility].

## 2018-09-02 DIAGNOSIS — S72402G Unspecified fracture of lower end of left femur, subsequent encounter for closed fracture with delayed healing: Secondary | ICD-10-CM | POA: Diagnosis not present

## 2018-09-02 DIAGNOSIS — W19XXXD Unspecified fall, subsequent encounter: Secondary | ICD-10-CM | POA: Diagnosis not present

## 2018-09-02 DIAGNOSIS — Z9889 Other specified postprocedural states: Secondary | ICD-10-CM | POA: Diagnosis not present

## 2018-09-02 DIAGNOSIS — M7989 Other specified soft tissue disorders: Secondary | ICD-10-CM | POA: Diagnosis not present

## 2018-09-02 DIAGNOSIS — I2510 Atherosclerotic heart disease of native coronary artery without angina pectoris: Secondary | ICD-10-CM | POA: Diagnosis not present

## 2018-09-02 DIAGNOSIS — D62 Acute posthemorrhagic anemia: Secondary | ICD-10-CM | POA: Diagnosis not present

## 2018-09-02 DIAGNOSIS — N183 Chronic kidney disease, stage 3 (moderate): Secondary | ICD-10-CM | POA: Diagnosis not present

## 2018-09-02 DIAGNOSIS — I10 Essential (primary) hypertension: Secondary | ICD-10-CM | POA: Diagnosis not present

## 2018-09-02 DIAGNOSIS — E669 Obesity, unspecified: Secondary | ICD-10-CM | POA: Diagnosis not present

## 2018-09-02 DIAGNOSIS — E782 Mixed hyperlipidemia: Secondary | ICD-10-CM | POA: Diagnosis not present

## 2018-09-03 DIAGNOSIS — W19XXXD Unspecified fall, subsequent encounter: Secondary | ICD-10-CM | POA: Diagnosis not present

## 2018-09-03 DIAGNOSIS — E669 Obesity, unspecified: Secondary | ICD-10-CM | POA: Diagnosis not present

## 2018-09-03 DIAGNOSIS — D62 Acute posthemorrhagic anemia: Secondary | ICD-10-CM | POA: Diagnosis not present

## 2018-09-03 DIAGNOSIS — E782 Mixed hyperlipidemia: Secondary | ICD-10-CM | POA: Diagnosis not present

## 2018-09-03 DIAGNOSIS — S72402G Unspecified fracture of lower end of left femur, subsequent encounter for closed fracture with delayed healing: Secondary | ICD-10-CM | POA: Diagnosis not present

## 2018-09-03 DIAGNOSIS — I2510 Atherosclerotic heart disease of native coronary artery without angina pectoris: Secondary | ICD-10-CM | POA: Diagnosis not present

## 2018-09-03 DIAGNOSIS — N183 Chronic kidney disease, stage 3 (moderate): Secondary | ICD-10-CM | POA: Diagnosis not present

## 2018-09-03 DIAGNOSIS — Z9889 Other specified postprocedural states: Secondary | ICD-10-CM | POA: Diagnosis not present

## 2018-09-03 DIAGNOSIS — I10 Essential (primary) hypertension: Secondary | ICD-10-CM | POA: Diagnosis not present

## 2018-09-03 DIAGNOSIS — M7989 Other specified soft tissue disorders: Secondary | ICD-10-CM | POA: Diagnosis not present

## 2018-09-05 DIAGNOSIS — E782 Mixed hyperlipidemia: Secondary | ICD-10-CM | POA: Diagnosis not present

## 2018-09-05 DIAGNOSIS — N183 Chronic kidney disease, stage 3 unspecified: Secondary | ICD-10-CM | POA: Diagnosis not present

## 2018-09-05 DIAGNOSIS — G40909 Epilepsy, unspecified, not intractable, without status epilepticus: Secondary | ICD-10-CM | POA: Diagnosis not present

## 2018-09-05 DIAGNOSIS — I129 Hypertensive chronic kidney disease with stage 1 through stage 4 chronic kidney disease, or unspecified chronic kidney disease: Secondary | ICD-10-CM | POA: Diagnosis not present

## 2018-09-05 DIAGNOSIS — S72042D Displaced fracture of base of neck of left femur, subsequent encounter for closed fracture with routine healing: Secondary | ICD-10-CM | POA: Diagnosis not present

## 2018-09-05 DIAGNOSIS — M9702XD Periprosthetic fracture around internal prosthetic left hip joint, subsequent encounter: Secondary | ICD-10-CM | POA: Diagnosis not present

## 2018-09-05 DIAGNOSIS — D500 Iron deficiency anemia secondary to blood loss (chronic): Secondary | ICD-10-CM | POA: Diagnosis not present

## 2018-09-05 DIAGNOSIS — I2510 Atherosclerotic heart disease of native coronary artery without angina pectoris: Secondary | ICD-10-CM | POA: Diagnosis not present

## 2018-09-05 DIAGNOSIS — Z6839 Body mass index (BMI) 39.0-39.9, adult: Secondary | ICD-10-CM | POA: Diagnosis not present

## 2018-09-06 DIAGNOSIS — S72042D Displaced fracture of base of neck of left femur, subsequent encounter for closed fracture with routine healing: Secondary | ICD-10-CM | POA: Diagnosis not present

## 2018-09-06 DIAGNOSIS — G40909 Epilepsy, unspecified, not intractable, without status epilepticus: Secondary | ICD-10-CM | POA: Diagnosis not present

## 2018-09-06 DIAGNOSIS — E782 Mixed hyperlipidemia: Secondary | ICD-10-CM | POA: Diagnosis not present

## 2018-09-06 DIAGNOSIS — Z6839 Body mass index (BMI) 39.0-39.9, adult: Secondary | ICD-10-CM | POA: Diagnosis not present

## 2018-09-06 DIAGNOSIS — M9702XD Periprosthetic fracture around internal prosthetic left hip joint, subsequent encounter: Secondary | ICD-10-CM | POA: Diagnosis not present

## 2018-09-06 DIAGNOSIS — N183 Chronic kidney disease, stage 3 (moderate): Secondary | ICD-10-CM | POA: Diagnosis not present

## 2018-09-06 DIAGNOSIS — I2510 Atherosclerotic heart disease of native coronary artery without angina pectoris: Secondary | ICD-10-CM | POA: Diagnosis not present

## 2018-09-06 DIAGNOSIS — D500 Iron deficiency anemia secondary to blood loss (chronic): Secondary | ICD-10-CM | POA: Diagnosis not present

## 2018-09-06 DIAGNOSIS — I129 Hypertensive chronic kidney disease with stage 1 through stage 4 chronic kidney disease, or unspecified chronic kidney disease: Secondary | ICD-10-CM | POA: Diagnosis not present

## 2018-09-07 DIAGNOSIS — Z6839 Body mass index (BMI) 39.0-39.9, adult: Secondary | ICD-10-CM | POA: Diagnosis not present

## 2018-09-07 DIAGNOSIS — N183 Chronic kidney disease, stage 3 (moderate): Secondary | ICD-10-CM | POA: Diagnosis not present

## 2018-09-07 DIAGNOSIS — G40909 Epilepsy, unspecified, not intractable, without status epilepticus: Secondary | ICD-10-CM | POA: Diagnosis not present

## 2018-09-07 DIAGNOSIS — S72042D Displaced fracture of base of neck of left femur, subsequent encounter for closed fracture with routine healing: Secondary | ICD-10-CM | POA: Diagnosis not present

## 2018-09-07 DIAGNOSIS — M9702XD Periprosthetic fracture around internal prosthetic left hip joint, subsequent encounter: Secondary | ICD-10-CM | POA: Diagnosis not present

## 2018-09-07 DIAGNOSIS — E782 Mixed hyperlipidemia: Secondary | ICD-10-CM | POA: Diagnosis not present

## 2018-09-07 DIAGNOSIS — D500 Iron deficiency anemia secondary to blood loss (chronic): Secondary | ICD-10-CM | POA: Diagnosis not present

## 2018-09-07 DIAGNOSIS — I2510 Atherosclerotic heart disease of native coronary artery without angina pectoris: Secondary | ICD-10-CM | POA: Diagnosis not present

## 2018-09-07 DIAGNOSIS — I129 Hypertensive chronic kidney disease with stage 1 through stage 4 chronic kidney disease, or unspecified chronic kidney disease: Secondary | ICD-10-CM | POA: Diagnosis not present

## 2018-09-08 DIAGNOSIS — E782 Mixed hyperlipidemia: Secondary | ICD-10-CM | POA: Diagnosis not present

## 2018-09-08 DIAGNOSIS — Z6839 Body mass index (BMI) 39.0-39.9, adult: Secondary | ICD-10-CM | POA: Diagnosis not present

## 2018-09-08 DIAGNOSIS — S72042D Displaced fracture of base of neck of left femur, subsequent encounter for closed fracture with routine healing: Secondary | ICD-10-CM | POA: Diagnosis not present

## 2018-09-08 DIAGNOSIS — M9702XD Periprosthetic fracture around internal prosthetic left hip joint, subsequent encounter: Secondary | ICD-10-CM | POA: Diagnosis not present

## 2018-09-08 DIAGNOSIS — Z96652 Presence of left artificial knee joint: Secondary | ICD-10-CM | POA: Diagnosis not present

## 2018-09-08 DIAGNOSIS — M9712XA Periprosthetic fracture around internal prosthetic left knee joint, initial encounter: Secondary | ICD-10-CM | POA: Diagnosis not present

## 2018-09-08 DIAGNOSIS — I2510 Atherosclerotic heart disease of native coronary artery without angina pectoris: Secondary | ICD-10-CM | POA: Diagnosis not present

## 2018-09-08 DIAGNOSIS — Z4789 Encounter for other orthopedic aftercare: Secondary | ICD-10-CM | POA: Diagnosis not present

## 2018-09-08 DIAGNOSIS — G40909 Epilepsy, unspecified, not intractable, without status epilepticus: Secondary | ICD-10-CM | POA: Diagnosis not present

## 2018-09-08 DIAGNOSIS — N183 Chronic kidney disease, stage 3 (moderate): Secondary | ICD-10-CM | POA: Diagnosis not present

## 2018-09-08 DIAGNOSIS — D500 Iron deficiency anemia secondary to blood loss (chronic): Secondary | ICD-10-CM | POA: Diagnosis not present

## 2018-09-08 DIAGNOSIS — I129 Hypertensive chronic kidney disease with stage 1 through stage 4 chronic kidney disease, or unspecified chronic kidney disease: Secondary | ICD-10-CM | POA: Diagnosis not present

## 2018-09-08 DIAGNOSIS — Z471 Aftercare following joint replacement surgery: Secondary | ICD-10-CM | POA: Diagnosis not present

## 2018-09-11 DIAGNOSIS — Z6839 Body mass index (BMI) 39.0-39.9, adult: Secondary | ICD-10-CM | POA: Diagnosis not present

## 2018-09-11 DIAGNOSIS — D500 Iron deficiency anemia secondary to blood loss (chronic): Secondary | ICD-10-CM | POA: Diagnosis not present

## 2018-09-11 DIAGNOSIS — S72042D Displaced fracture of base of neck of left femur, subsequent encounter for closed fracture with routine healing: Secondary | ICD-10-CM | POA: Diagnosis not present

## 2018-09-11 DIAGNOSIS — I2510 Atherosclerotic heart disease of native coronary artery without angina pectoris: Secondary | ICD-10-CM | POA: Diagnosis not present

## 2018-09-11 DIAGNOSIS — N183 Chronic kidney disease, stage 3 (moderate): Secondary | ICD-10-CM | POA: Diagnosis not present

## 2018-09-11 DIAGNOSIS — E782 Mixed hyperlipidemia: Secondary | ICD-10-CM | POA: Diagnosis not present

## 2018-09-11 DIAGNOSIS — M9702XD Periprosthetic fracture around internal prosthetic left hip joint, subsequent encounter: Secondary | ICD-10-CM | POA: Diagnosis not present

## 2018-09-11 DIAGNOSIS — G40909 Epilepsy, unspecified, not intractable, without status epilepticus: Secondary | ICD-10-CM | POA: Diagnosis not present

## 2018-09-11 DIAGNOSIS — I129 Hypertensive chronic kidney disease with stage 1 through stage 4 chronic kidney disease, or unspecified chronic kidney disease: Secondary | ICD-10-CM | POA: Diagnosis not present

## 2018-09-13 DIAGNOSIS — N183 Chronic kidney disease, stage 3 (moderate): Secondary | ICD-10-CM | POA: Diagnosis not present

## 2018-09-13 DIAGNOSIS — G40909 Epilepsy, unspecified, not intractable, without status epilepticus: Secondary | ICD-10-CM | POA: Diagnosis not present

## 2018-09-13 DIAGNOSIS — I129 Hypertensive chronic kidney disease with stage 1 through stage 4 chronic kidney disease, or unspecified chronic kidney disease: Secondary | ICD-10-CM | POA: Diagnosis not present

## 2018-09-13 DIAGNOSIS — E782 Mixed hyperlipidemia: Secondary | ICD-10-CM | POA: Diagnosis not present

## 2018-09-13 DIAGNOSIS — D500 Iron deficiency anemia secondary to blood loss (chronic): Secondary | ICD-10-CM | POA: Diagnosis not present

## 2018-09-13 DIAGNOSIS — Z6839 Body mass index (BMI) 39.0-39.9, adult: Secondary | ICD-10-CM | POA: Diagnosis not present

## 2018-09-13 DIAGNOSIS — I2510 Atherosclerotic heart disease of native coronary artery without angina pectoris: Secondary | ICD-10-CM | POA: Diagnosis not present

## 2018-09-13 DIAGNOSIS — S72042D Displaced fracture of base of neck of left femur, subsequent encounter for closed fracture with routine healing: Secondary | ICD-10-CM | POA: Diagnosis not present

## 2018-09-13 DIAGNOSIS — M9702XD Periprosthetic fracture around internal prosthetic left hip joint, subsequent encounter: Secondary | ICD-10-CM | POA: Diagnosis not present

## 2018-09-15 DIAGNOSIS — Z6839 Body mass index (BMI) 39.0-39.9, adult: Secondary | ICD-10-CM | POA: Diagnosis not present

## 2018-09-15 DIAGNOSIS — M9702XD Periprosthetic fracture around internal prosthetic left hip joint, subsequent encounter: Secondary | ICD-10-CM | POA: Diagnosis not present

## 2018-09-15 DIAGNOSIS — S72042D Displaced fracture of base of neck of left femur, subsequent encounter for closed fracture with routine healing: Secondary | ICD-10-CM | POA: Diagnosis not present

## 2018-09-15 DIAGNOSIS — G40909 Epilepsy, unspecified, not intractable, without status epilepticus: Secondary | ICD-10-CM | POA: Diagnosis not present

## 2018-09-15 DIAGNOSIS — E782 Mixed hyperlipidemia: Secondary | ICD-10-CM | POA: Diagnosis not present

## 2018-09-15 DIAGNOSIS — N183 Chronic kidney disease, stage 3 (moderate): Secondary | ICD-10-CM | POA: Diagnosis not present

## 2018-09-15 DIAGNOSIS — I129 Hypertensive chronic kidney disease with stage 1 through stage 4 chronic kidney disease, or unspecified chronic kidney disease: Secondary | ICD-10-CM | POA: Diagnosis not present

## 2018-09-15 DIAGNOSIS — I2510 Atherosclerotic heart disease of native coronary artery without angina pectoris: Secondary | ICD-10-CM | POA: Diagnosis not present

## 2018-09-15 DIAGNOSIS — D500 Iron deficiency anemia secondary to blood loss (chronic): Secondary | ICD-10-CM | POA: Diagnosis not present

## 2018-09-18 DIAGNOSIS — I129 Hypertensive chronic kidney disease with stage 1 through stage 4 chronic kidney disease, or unspecified chronic kidney disease: Secondary | ICD-10-CM | POA: Diagnosis not present

## 2018-09-18 DIAGNOSIS — E782 Mixed hyperlipidemia: Secondary | ICD-10-CM | POA: Diagnosis not present

## 2018-09-18 DIAGNOSIS — I2510 Atherosclerotic heart disease of native coronary artery without angina pectoris: Secondary | ICD-10-CM | POA: Diagnosis not present

## 2018-09-18 DIAGNOSIS — Z6839 Body mass index (BMI) 39.0-39.9, adult: Secondary | ICD-10-CM | POA: Diagnosis not present

## 2018-09-18 DIAGNOSIS — G40909 Epilepsy, unspecified, not intractable, without status epilepticus: Secondary | ICD-10-CM | POA: Diagnosis not present

## 2018-09-18 DIAGNOSIS — S72042D Displaced fracture of base of neck of left femur, subsequent encounter for closed fracture with routine healing: Secondary | ICD-10-CM | POA: Diagnosis not present

## 2018-09-18 DIAGNOSIS — N183 Chronic kidney disease, stage 3 (moderate): Secondary | ICD-10-CM | POA: Diagnosis not present

## 2018-09-18 DIAGNOSIS — D500 Iron deficiency anemia secondary to blood loss (chronic): Secondary | ICD-10-CM | POA: Diagnosis not present

## 2018-09-18 DIAGNOSIS — M9702XD Periprosthetic fracture around internal prosthetic left hip joint, subsequent encounter: Secondary | ICD-10-CM | POA: Diagnosis not present

## 2018-09-19 DIAGNOSIS — M9702XD Periprosthetic fracture around internal prosthetic left hip joint, subsequent encounter: Secondary | ICD-10-CM | POA: Diagnosis not present

## 2018-09-19 DIAGNOSIS — I2510 Atherosclerotic heart disease of native coronary artery without angina pectoris: Secondary | ICD-10-CM | POA: Diagnosis not present

## 2018-09-19 DIAGNOSIS — S72042D Displaced fracture of base of neck of left femur, subsequent encounter for closed fracture with routine healing: Secondary | ICD-10-CM | POA: Diagnosis not present

## 2018-09-19 DIAGNOSIS — D500 Iron deficiency anemia secondary to blood loss (chronic): Secondary | ICD-10-CM | POA: Diagnosis not present

## 2018-09-19 DIAGNOSIS — G40909 Epilepsy, unspecified, not intractable, without status epilepticus: Secondary | ICD-10-CM | POA: Diagnosis not present

## 2018-09-19 DIAGNOSIS — Z6839 Body mass index (BMI) 39.0-39.9, adult: Secondary | ICD-10-CM | POA: Diagnosis not present

## 2018-09-19 DIAGNOSIS — N183 Chronic kidney disease, stage 3 (moderate): Secondary | ICD-10-CM | POA: Diagnosis not present

## 2018-09-19 DIAGNOSIS — I129 Hypertensive chronic kidney disease with stage 1 through stage 4 chronic kidney disease, or unspecified chronic kidney disease: Secondary | ICD-10-CM | POA: Diagnosis not present

## 2018-09-19 DIAGNOSIS — E782 Mixed hyperlipidemia: Secondary | ICD-10-CM | POA: Diagnosis not present

## 2018-09-20 DIAGNOSIS — E782 Mixed hyperlipidemia: Secondary | ICD-10-CM | POA: Diagnosis not present

## 2018-09-20 DIAGNOSIS — I129 Hypertensive chronic kidney disease with stage 1 through stage 4 chronic kidney disease, or unspecified chronic kidney disease: Secondary | ICD-10-CM | POA: Diagnosis not present

## 2018-09-20 DIAGNOSIS — M9702XD Periprosthetic fracture around internal prosthetic left hip joint, subsequent encounter: Secondary | ICD-10-CM | POA: Diagnosis not present

## 2018-09-20 DIAGNOSIS — S72042D Displaced fracture of base of neck of left femur, subsequent encounter for closed fracture with routine healing: Secondary | ICD-10-CM | POA: Diagnosis not present

## 2018-09-20 DIAGNOSIS — I2510 Atherosclerotic heart disease of native coronary artery without angina pectoris: Secondary | ICD-10-CM | POA: Diagnosis not present

## 2018-09-20 DIAGNOSIS — G40909 Epilepsy, unspecified, not intractable, without status epilepticus: Secondary | ICD-10-CM | POA: Diagnosis not present

## 2018-09-20 DIAGNOSIS — D500 Iron deficiency anemia secondary to blood loss (chronic): Secondary | ICD-10-CM | POA: Diagnosis not present

## 2018-09-20 DIAGNOSIS — N183 Chronic kidney disease, stage 3 (moderate): Secondary | ICD-10-CM | POA: Diagnosis not present

## 2018-09-20 DIAGNOSIS — Z6839 Body mass index (BMI) 39.0-39.9, adult: Secondary | ICD-10-CM | POA: Diagnosis not present

## 2018-09-21 DIAGNOSIS — E782 Mixed hyperlipidemia: Secondary | ICD-10-CM | POA: Diagnosis not present

## 2018-09-21 DIAGNOSIS — I2510 Atherosclerotic heart disease of native coronary artery without angina pectoris: Secondary | ICD-10-CM | POA: Diagnosis not present

## 2018-09-21 DIAGNOSIS — D500 Iron deficiency anemia secondary to blood loss (chronic): Secondary | ICD-10-CM | POA: Diagnosis not present

## 2018-09-21 DIAGNOSIS — M9702XD Periprosthetic fracture around internal prosthetic left hip joint, subsequent encounter: Secondary | ICD-10-CM | POA: Diagnosis not present

## 2018-09-21 DIAGNOSIS — I129 Hypertensive chronic kidney disease with stage 1 through stage 4 chronic kidney disease, or unspecified chronic kidney disease: Secondary | ICD-10-CM | POA: Diagnosis not present

## 2018-09-21 DIAGNOSIS — G40909 Epilepsy, unspecified, not intractable, without status epilepticus: Secondary | ICD-10-CM | POA: Diagnosis not present

## 2018-09-21 DIAGNOSIS — Z6839 Body mass index (BMI) 39.0-39.9, adult: Secondary | ICD-10-CM | POA: Diagnosis not present

## 2018-09-21 DIAGNOSIS — N183 Chronic kidney disease, stage 3 (moderate): Secondary | ICD-10-CM | POA: Diagnosis not present

## 2018-09-21 DIAGNOSIS — S72042D Displaced fracture of base of neck of left femur, subsequent encounter for closed fracture with routine healing: Secondary | ICD-10-CM | POA: Diagnosis not present

## 2018-09-25 DIAGNOSIS — I129 Hypertensive chronic kidney disease with stage 1 through stage 4 chronic kidney disease, or unspecified chronic kidney disease: Secondary | ICD-10-CM | POA: Diagnosis not present

## 2018-09-25 DIAGNOSIS — I2510 Atherosclerotic heart disease of native coronary artery without angina pectoris: Secondary | ICD-10-CM | POA: Diagnosis not present

## 2018-09-25 DIAGNOSIS — M9702XD Periprosthetic fracture around internal prosthetic left hip joint, subsequent encounter: Secondary | ICD-10-CM | POA: Diagnosis not present

## 2018-09-25 DIAGNOSIS — S72042D Displaced fracture of base of neck of left femur, subsequent encounter for closed fracture with routine healing: Secondary | ICD-10-CM | POA: Diagnosis not present

## 2018-09-25 DIAGNOSIS — D500 Iron deficiency anemia secondary to blood loss (chronic): Secondary | ICD-10-CM | POA: Diagnosis not present

## 2018-09-25 DIAGNOSIS — Z6839 Body mass index (BMI) 39.0-39.9, adult: Secondary | ICD-10-CM | POA: Diagnosis not present

## 2018-09-25 DIAGNOSIS — N183 Chronic kidney disease, stage 3 (moderate): Secondary | ICD-10-CM | POA: Diagnosis not present

## 2018-09-25 DIAGNOSIS — G40909 Epilepsy, unspecified, not intractable, without status epilepticus: Secondary | ICD-10-CM | POA: Diagnosis not present

## 2018-09-25 DIAGNOSIS — E782 Mixed hyperlipidemia: Secondary | ICD-10-CM | POA: Diagnosis not present

## 2018-09-26 DIAGNOSIS — Z6839 Body mass index (BMI) 39.0-39.9, adult: Secondary | ICD-10-CM | POA: Diagnosis not present

## 2018-09-26 DIAGNOSIS — I2510 Atherosclerotic heart disease of native coronary artery without angina pectoris: Secondary | ICD-10-CM | POA: Diagnosis not present

## 2018-09-26 DIAGNOSIS — M9702XD Periprosthetic fracture around internal prosthetic left hip joint, subsequent encounter: Secondary | ICD-10-CM | POA: Diagnosis not present

## 2018-09-26 DIAGNOSIS — G40909 Epilepsy, unspecified, not intractable, without status epilepticus: Secondary | ICD-10-CM | POA: Diagnosis not present

## 2018-09-26 DIAGNOSIS — D500 Iron deficiency anemia secondary to blood loss (chronic): Secondary | ICD-10-CM | POA: Diagnosis not present

## 2018-09-26 DIAGNOSIS — S72042D Displaced fracture of base of neck of left femur, subsequent encounter for closed fracture with routine healing: Secondary | ICD-10-CM | POA: Diagnosis not present

## 2018-09-26 DIAGNOSIS — I129 Hypertensive chronic kidney disease with stage 1 through stage 4 chronic kidney disease, or unspecified chronic kidney disease: Secondary | ICD-10-CM | POA: Diagnosis not present

## 2018-09-26 DIAGNOSIS — E782 Mixed hyperlipidemia: Secondary | ICD-10-CM | POA: Diagnosis not present

## 2018-09-26 DIAGNOSIS — N183 Chronic kidney disease, stage 3 (moderate): Secondary | ICD-10-CM | POA: Diagnosis not present

## 2018-09-29 DIAGNOSIS — S72042D Displaced fracture of base of neck of left femur, subsequent encounter for closed fracture with routine healing: Secondary | ICD-10-CM | POA: Diagnosis not present

## 2018-09-29 DIAGNOSIS — M9702XD Periprosthetic fracture around internal prosthetic left hip joint, subsequent encounter: Secondary | ICD-10-CM | POA: Diagnosis not present

## 2018-09-29 DIAGNOSIS — D500 Iron deficiency anemia secondary to blood loss (chronic): Secondary | ICD-10-CM | POA: Diagnosis not present

## 2018-09-29 DIAGNOSIS — Z6839 Body mass index (BMI) 39.0-39.9, adult: Secondary | ICD-10-CM | POA: Diagnosis not present

## 2018-09-29 DIAGNOSIS — I129 Hypertensive chronic kidney disease with stage 1 through stage 4 chronic kidney disease, or unspecified chronic kidney disease: Secondary | ICD-10-CM | POA: Diagnosis not present

## 2018-09-29 DIAGNOSIS — G40909 Epilepsy, unspecified, not intractable, without status epilepticus: Secondary | ICD-10-CM | POA: Diagnosis not present

## 2018-09-29 DIAGNOSIS — E782 Mixed hyperlipidemia: Secondary | ICD-10-CM | POA: Diagnosis not present

## 2018-09-29 DIAGNOSIS — I2510 Atherosclerotic heart disease of native coronary artery without angina pectoris: Secondary | ICD-10-CM | POA: Diagnosis not present

## 2018-09-29 DIAGNOSIS — N183 Chronic kidney disease, stage 3 (moderate): Secondary | ICD-10-CM | POA: Diagnosis not present

## 2018-10-03 DIAGNOSIS — D500 Iron deficiency anemia secondary to blood loss (chronic): Secondary | ICD-10-CM | POA: Diagnosis not present

## 2018-10-03 DIAGNOSIS — I129 Hypertensive chronic kidney disease with stage 1 through stage 4 chronic kidney disease, or unspecified chronic kidney disease: Secondary | ICD-10-CM | POA: Diagnosis not present

## 2018-10-03 DIAGNOSIS — M9702XD Periprosthetic fracture around internal prosthetic left hip joint, subsequent encounter: Secondary | ICD-10-CM | POA: Diagnosis not present

## 2018-10-03 DIAGNOSIS — Z6839 Body mass index (BMI) 39.0-39.9, adult: Secondary | ICD-10-CM | POA: Diagnosis not present

## 2018-10-03 DIAGNOSIS — S72042D Displaced fracture of base of neck of left femur, subsequent encounter for closed fracture with routine healing: Secondary | ICD-10-CM | POA: Diagnosis not present

## 2018-10-03 DIAGNOSIS — N183 Chronic kidney disease, stage 3 (moderate): Secondary | ICD-10-CM | POA: Diagnosis not present

## 2018-10-03 DIAGNOSIS — G40909 Epilepsy, unspecified, not intractable, without status epilepticus: Secondary | ICD-10-CM | POA: Diagnosis not present

## 2018-10-03 DIAGNOSIS — I2510 Atherosclerotic heart disease of native coronary artery without angina pectoris: Secondary | ICD-10-CM | POA: Diagnosis not present

## 2018-10-03 DIAGNOSIS — E782 Mixed hyperlipidemia: Secondary | ICD-10-CM | POA: Diagnosis not present

## 2018-10-04 DIAGNOSIS — D500 Iron deficiency anemia secondary to blood loss (chronic): Secondary | ICD-10-CM | POA: Diagnosis not present

## 2018-10-04 DIAGNOSIS — M9702XD Periprosthetic fracture around internal prosthetic left hip joint, subsequent encounter: Secondary | ICD-10-CM | POA: Diagnosis not present

## 2018-10-04 DIAGNOSIS — I2510 Atherosclerotic heart disease of native coronary artery without angina pectoris: Secondary | ICD-10-CM | POA: Diagnosis not present

## 2018-10-04 DIAGNOSIS — Z6839 Body mass index (BMI) 39.0-39.9, adult: Secondary | ICD-10-CM | POA: Diagnosis not present

## 2018-10-04 DIAGNOSIS — E782 Mixed hyperlipidemia: Secondary | ICD-10-CM | POA: Diagnosis not present

## 2018-10-04 DIAGNOSIS — N183 Chronic kidney disease, stage 3 (moderate): Secondary | ICD-10-CM | POA: Diagnosis not present

## 2018-10-04 DIAGNOSIS — I129 Hypertensive chronic kidney disease with stage 1 through stage 4 chronic kidney disease, or unspecified chronic kidney disease: Secondary | ICD-10-CM | POA: Diagnosis not present

## 2018-10-04 DIAGNOSIS — S72042D Displaced fracture of base of neck of left femur, subsequent encounter for closed fracture with routine healing: Secondary | ICD-10-CM | POA: Diagnosis not present

## 2018-10-04 DIAGNOSIS — G40909 Epilepsy, unspecified, not intractable, without status epilepticus: Secondary | ICD-10-CM | POA: Diagnosis not present

## 2018-10-05 DIAGNOSIS — I2510 Atherosclerotic heart disease of native coronary artery without angina pectoris: Secondary | ICD-10-CM | POA: Diagnosis not present

## 2018-10-05 DIAGNOSIS — N183 Chronic kidney disease, stage 3 (moderate): Secondary | ICD-10-CM | POA: Diagnosis not present

## 2018-10-05 DIAGNOSIS — M9702XD Periprosthetic fracture around internal prosthetic left hip joint, subsequent encounter: Secondary | ICD-10-CM | POA: Diagnosis not present

## 2018-10-05 DIAGNOSIS — Z6839 Body mass index (BMI) 39.0-39.9, adult: Secondary | ICD-10-CM | POA: Diagnosis not present

## 2018-10-05 DIAGNOSIS — I129 Hypertensive chronic kidney disease with stage 1 through stage 4 chronic kidney disease, or unspecified chronic kidney disease: Secondary | ICD-10-CM | POA: Diagnosis not present

## 2018-10-05 DIAGNOSIS — D500 Iron deficiency anemia secondary to blood loss (chronic): Secondary | ICD-10-CM | POA: Diagnosis not present

## 2018-10-05 DIAGNOSIS — E782 Mixed hyperlipidemia: Secondary | ICD-10-CM | POA: Diagnosis not present

## 2018-10-05 DIAGNOSIS — S72042D Displaced fracture of base of neck of left femur, subsequent encounter for closed fracture with routine healing: Secondary | ICD-10-CM | POA: Diagnosis not present

## 2018-10-05 DIAGNOSIS — G40909 Epilepsy, unspecified, not intractable, without status epilepticus: Secondary | ICD-10-CM | POA: Diagnosis not present

## 2018-10-09 DIAGNOSIS — D500 Iron deficiency anemia secondary to blood loss (chronic): Secondary | ICD-10-CM | POA: Diagnosis not present

## 2018-10-09 DIAGNOSIS — I129 Hypertensive chronic kidney disease with stage 1 through stage 4 chronic kidney disease, or unspecified chronic kidney disease: Secondary | ICD-10-CM | POA: Diagnosis not present

## 2018-10-09 DIAGNOSIS — M9702XD Periprosthetic fracture around internal prosthetic left hip joint, subsequent encounter: Secondary | ICD-10-CM | POA: Diagnosis not present

## 2018-10-09 DIAGNOSIS — E782 Mixed hyperlipidemia: Secondary | ICD-10-CM | POA: Diagnosis not present

## 2018-10-09 DIAGNOSIS — S72042D Displaced fracture of base of neck of left femur, subsequent encounter for closed fracture with routine healing: Secondary | ICD-10-CM | POA: Diagnosis not present

## 2018-10-09 DIAGNOSIS — Z6839 Body mass index (BMI) 39.0-39.9, adult: Secondary | ICD-10-CM | POA: Diagnosis not present

## 2018-10-09 DIAGNOSIS — N183 Chronic kidney disease, stage 3 unspecified: Secondary | ICD-10-CM | POA: Diagnosis not present

## 2018-10-09 DIAGNOSIS — I2510 Atherosclerotic heart disease of native coronary artery without angina pectoris: Secondary | ICD-10-CM | POA: Diagnosis not present

## 2018-10-09 DIAGNOSIS — G40909 Epilepsy, unspecified, not intractable, without status epilepticus: Secondary | ICD-10-CM | POA: Diagnosis not present

## 2018-10-10 DIAGNOSIS — I129 Hypertensive chronic kidney disease with stage 1 through stage 4 chronic kidney disease, or unspecified chronic kidney disease: Secondary | ICD-10-CM | POA: Diagnosis not present

## 2018-10-10 DIAGNOSIS — S72042D Displaced fracture of base of neck of left femur, subsequent encounter for closed fracture with routine healing: Secondary | ICD-10-CM | POA: Diagnosis not present

## 2018-10-10 DIAGNOSIS — G40909 Epilepsy, unspecified, not intractable, without status epilepticus: Secondary | ICD-10-CM | POA: Diagnosis not present

## 2018-10-10 DIAGNOSIS — D500 Iron deficiency anemia secondary to blood loss (chronic): Secondary | ICD-10-CM | POA: Diagnosis not present

## 2018-10-10 DIAGNOSIS — E782 Mixed hyperlipidemia: Secondary | ICD-10-CM | POA: Diagnosis not present

## 2018-10-10 DIAGNOSIS — N183 Chronic kidney disease, stage 3 unspecified: Secondary | ICD-10-CM | POA: Diagnosis not present

## 2018-10-10 DIAGNOSIS — I2510 Atherosclerotic heart disease of native coronary artery without angina pectoris: Secondary | ICD-10-CM | POA: Diagnosis not present

## 2018-10-10 DIAGNOSIS — Z6839 Body mass index (BMI) 39.0-39.9, adult: Secondary | ICD-10-CM | POA: Diagnosis not present

## 2018-10-10 DIAGNOSIS — M9702XD Periprosthetic fracture around internal prosthetic left hip joint, subsequent encounter: Secondary | ICD-10-CM | POA: Diagnosis not present

## 2018-10-11 DIAGNOSIS — M9702XD Periprosthetic fracture around internal prosthetic left hip joint, subsequent encounter: Secondary | ICD-10-CM | POA: Diagnosis not present

## 2018-10-11 DIAGNOSIS — N183 Chronic kidney disease, stage 3 unspecified: Secondary | ICD-10-CM | POA: Diagnosis not present

## 2018-10-11 DIAGNOSIS — Z6839 Body mass index (BMI) 39.0-39.9, adult: Secondary | ICD-10-CM | POA: Diagnosis not present

## 2018-10-11 DIAGNOSIS — Z4789 Encounter for other orthopedic aftercare: Secondary | ICD-10-CM | POA: Diagnosis not present

## 2018-10-11 DIAGNOSIS — S72042D Displaced fracture of base of neck of left femur, subsequent encounter for closed fracture with routine healing: Secondary | ICD-10-CM | POA: Diagnosis not present

## 2018-10-11 DIAGNOSIS — I129 Hypertensive chronic kidney disease with stage 1 through stage 4 chronic kidney disease, or unspecified chronic kidney disease: Secondary | ICD-10-CM | POA: Diagnosis not present

## 2018-10-11 DIAGNOSIS — D500 Iron deficiency anemia secondary to blood loss (chronic): Secondary | ICD-10-CM | POA: Diagnosis not present

## 2018-10-11 DIAGNOSIS — E782 Mixed hyperlipidemia: Secondary | ICD-10-CM | POA: Diagnosis not present

## 2018-10-11 DIAGNOSIS — G40909 Epilepsy, unspecified, not intractable, without status epilepticus: Secondary | ICD-10-CM | POA: Diagnosis not present

## 2018-10-11 DIAGNOSIS — I2510 Atherosclerotic heart disease of native coronary artery without angina pectoris: Secondary | ICD-10-CM | POA: Diagnosis not present

## 2018-10-16 DIAGNOSIS — M9702XD Periprosthetic fracture around internal prosthetic left hip joint, subsequent encounter: Secondary | ICD-10-CM | POA: Diagnosis not present

## 2018-10-16 DIAGNOSIS — I129 Hypertensive chronic kidney disease with stage 1 through stage 4 chronic kidney disease, or unspecified chronic kidney disease: Secondary | ICD-10-CM | POA: Diagnosis not present

## 2018-10-16 DIAGNOSIS — E782 Mixed hyperlipidemia: Secondary | ICD-10-CM | POA: Diagnosis not present

## 2018-10-16 DIAGNOSIS — Z6839 Body mass index (BMI) 39.0-39.9, adult: Secondary | ICD-10-CM | POA: Diagnosis not present

## 2018-10-16 DIAGNOSIS — D500 Iron deficiency anemia secondary to blood loss (chronic): Secondary | ICD-10-CM | POA: Diagnosis not present

## 2018-10-16 DIAGNOSIS — G40909 Epilepsy, unspecified, not intractable, without status epilepticus: Secondary | ICD-10-CM | POA: Diagnosis not present

## 2018-10-16 DIAGNOSIS — N183 Chronic kidney disease, stage 3 unspecified: Secondary | ICD-10-CM | POA: Diagnosis not present

## 2018-10-16 DIAGNOSIS — I2510 Atherosclerotic heart disease of native coronary artery without angina pectoris: Secondary | ICD-10-CM | POA: Diagnosis not present

## 2018-10-16 DIAGNOSIS — S72042D Displaced fracture of base of neck of left femur, subsequent encounter for closed fracture with routine healing: Secondary | ICD-10-CM | POA: Diagnosis not present

## 2018-10-18 DIAGNOSIS — Z6839 Body mass index (BMI) 39.0-39.9, adult: Secondary | ICD-10-CM | POA: Diagnosis not present

## 2018-10-18 DIAGNOSIS — S72042D Displaced fracture of base of neck of left femur, subsequent encounter for closed fracture with routine healing: Secondary | ICD-10-CM | POA: Diagnosis not present

## 2018-10-18 DIAGNOSIS — D500 Iron deficiency anemia secondary to blood loss (chronic): Secondary | ICD-10-CM | POA: Diagnosis not present

## 2018-10-18 DIAGNOSIS — I129 Hypertensive chronic kidney disease with stage 1 through stage 4 chronic kidney disease, or unspecified chronic kidney disease: Secondary | ICD-10-CM | POA: Diagnosis not present

## 2018-10-18 DIAGNOSIS — I2510 Atherosclerotic heart disease of native coronary artery without angina pectoris: Secondary | ICD-10-CM | POA: Diagnosis not present

## 2018-10-18 DIAGNOSIS — E782 Mixed hyperlipidemia: Secondary | ICD-10-CM | POA: Diagnosis not present

## 2018-10-18 DIAGNOSIS — M9702XD Periprosthetic fracture around internal prosthetic left hip joint, subsequent encounter: Secondary | ICD-10-CM | POA: Diagnosis not present

## 2018-10-18 DIAGNOSIS — G40909 Epilepsy, unspecified, not intractable, without status epilepticus: Secondary | ICD-10-CM | POA: Diagnosis not present

## 2018-10-18 DIAGNOSIS — N183 Chronic kidney disease, stage 3 unspecified: Secondary | ICD-10-CM | POA: Diagnosis not present

## 2018-10-23 DIAGNOSIS — Z6839 Body mass index (BMI) 39.0-39.9, adult: Secondary | ICD-10-CM | POA: Diagnosis not present

## 2018-10-23 DIAGNOSIS — M9702XD Periprosthetic fracture around internal prosthetic left hip joint, subsequent encounter: Secondary | ICD-10-CM | POA: Diagnosis not present

## 2018-10-23 DIAGNOSIS — N183 Chronic kidney disease, stage 3 unspecified: Secondary | ICD-10-CM | POA: Diagnosis not present

## 2018-10-23 DIAGNOSIS — D500 Iron deficiency anemia secondary to blood loss (chronic): Secondary | ICD-10-CM | POA: Diagnosis not present

## 2018-10-23 DIAGNOSIS — I2510 Atherosclerotic heart disease of native coronary artery without angina pectoris: Secondary | ICD-10-CM | POA: Diagnosis not present

## 2018-10-23 DIAGNOSIS — I129 Hypertensive chronic kidney disease with stage 1 through stage 4 chronic kidney disease, or unspecified chronic kidney disease: Secondary | ICD-10-CM | POA: Diagnosis not present

## 2018-10-23 DIAGNOSIS — S72042D Displaced fracture of base of neck of left femur, subsequent encounter for closed fracture with routine healing: Secondary | ICD-10-CM | POA: Diagnosis not present

## 2018-10-23 DIAGNOSIS — G40909 Epilepsy, unspecified, not intractable, without status epilepticus: Secondary | ICD-10-CM | POA: Diagnosis not present

## 2018-10-23 DIAGNOSIS — E782 Mixed hyperlipidemia: Secondary | ICD-10-CM | POA: Diagnosis not present

## 2018-10-25 DIAGNOSIS — S72042D Displaced fracture of base of neck of left femur, subsequent encounter for closed fracture with routine healing: Secondary | ICD-10-CM | POA: Diagnosis not present

## 2018-10-25 DIAGNOSIS — D500 Iron deficiency anemia secondary to blood loss (chronic): Secondary | ICD-10-CM | POA: Diagnosis not present

## 2018-10-25 DIAGNOSIS — E782 Mixed hyperlipidemia: Secondary | ICD-10-CM | POA: Diagnosis not present

## 2018-10-25 DIAGNOSIS — I10 Essential (primary) hypertension: Secondary | ICD-10-CM | POA: Diagnosis not present

## 2018-10-25 DIAGNOSIS — I129 Hypertensive chronic kidney disease with stage 1 through stage 4 chronic kidney disease, or unspecified chronic kidney disease: Secondary | ICD-10-CM | POA: Diagnosis not present

## 2018-10-25 DIAGNOSIS — I2510 Atherosclerotic heart disease of native coronary artery without angina pectoris: Secondary | ICD-10-CM | POA: Diagnosis not present

## 2018-10-25 DIAGNOSIS — Z79899 Other long term (current) drug therapy: Secondary | ICD-10-CM | POA: Diagnosis not present

## 2018-10-25 DIAGNOSIS — E785 Hyperlipidemia, unspecified: Secondary | ICD-10-CM | POA: Diagnosis not present

## 2018-10-25 DIAGNOSIS — Z23 Encounter for immunization: Secondary | ICD-10-CM | POA: Diagnosis not present

## 2018-10-25 DIAGNOSIS — N183 Chronic kidney disease, stage 3 unspecified: Secondary | ICD-10-CM | POA: Diagnosis not present

## 2018-10-25 DIAGNOSIS — Z6839 Body mass index (BMI) 39.0-39.9, adult: Secondary | ICD-10-CM | POA: Diagnosis not present

## 2018-10-25 DIAGNOSIS — M9702XD Periprosthetic fracture around internal prosthetic left hip joint, subsequent encounter: Secondary | ICD-10-CM | POA: Diagnosis not present

## 2018-10-25 DIAGNOSIS — G40909 Epilepsy, unspecified, not intractable, without status epilepticus: Secondary | ICD-10-CM | POA: Diagnosis not present

## 2018-10-25 DIAGNOSIS — Z789 Other specified health status: Secondary | ICD-10-CM | POA: Diagnosis not present

## 2018-10-30 DIAGNOSIS — G40909 Epilepsy, unspecified, not intractable, without status epilepticus: Secondary | ICD-10-CM | POA: Diagnosis not present

## 2018-10-30 DIAGNOSIS — I2510 Atherosclerotic heart disease of native coronary artery without angina pectoris: Secondary | ICD-10-CM | POA: Diagnosis not present

## 2018-10-30 DIAGNOSIS — S72042D Displaced fracture of base of neck of left femur, subsequent encounter for closed fracture with routine healing: Secondary | ICD-10-CM | POA: Diagnosis not present

## 2018-10-30 DIAGNOSIS — Z6839 Body mass index (BMI) 39.0-39.9, adult: Secondary | ICD-10-CM | POA: Diagnosis not present

## 2018-10-30 DIAGNOSIS — D500 Iron deficiency anemia secondary to blood loss (chronic): Secondary | ICD-10-CM | POA: Diagnosis not present

## 2018-10-30 DIAGNOSIS — E782 Mixed hyperlipidemia: Secondary | ICD-10-CM | POA: Diagnosis not present

## 2018-10-30 DIAGNOSIS — I129 Hypertensive chronic kidney disease with stage 1 through stage 4 chronic kidney disease, or unspecified chronic kidney disease: Secondary | ICD-10-CM | POA: Diagnosis not present

## 2018-10-30 DIAGNOSIS — M9702XD Periprosthetic fracture around internal prosthetic left hip joint, subsequent encounter: Secondary | ICD-10-CM | POA: Diagnosis not present

## 2018-10-30 DIAGNOSIS — N183 Chronic kidney disease, stage 3 unspecified: Secondary | ICD-10-CM | POA: Diagnosis not present

## 2018-11-06 DIAGNOSIS — M25562 Pain in left knee: Secondary | ICD-10-CM | POA: Diagnosis not present

## 2018-11-13 DIAGNOSIS — M25562 Pain in left knee: Secondary | ICD-10-CM | POA: Diagnosis not present

## 2018-11-17 DIAGNOSIS — M25562 Pain in left knee: Secondary | ICD-10-CM | POA: Diagnosis not present

## 2018-11-20 DIAGNOSIS — M25562 Pain in left knee: Secondary | ICD-10-CM | POA: Diagnosis not present

## 2018-11-22 DIAGNOSIS — Z4789 Encounter for other orthopedic aftercare: Secondary | ICD-10-CM | POA: Diagnosis not present

## 2018-11-22 DIAGNOSIS — M9712XA Periprosthetic fracture around internal prosthetic left knee joint, initial encounter: Secondary | ICD-10-CM | POA: Diagnosis not present

## 2018-11-22 DIAGNOSIS — Z96652 Presence of left artificial knee joint: Secondary | ICD-10-CM | POA: Diagnosis not present

## 2018-11-28 DIAGNOSIS — M25562 Pain in left knee: Secondary | ICD-10-CM | POA: Diagnosis not present

## 2018-12-04 DIAGNOSIS — Z96652 Presence of left artificial knee joint: Secondary | ICD-10-CM | POA: Diagnosis not present

## 2018-12-04 DIAGNOSIS — M9712XD Periprosthetic fracture around internal prosthetic left knee joint, subsequent encounter: Secondary | ICD-10-CM | POA: Diagnosis not present

## 2018-12-08 DIAGNOSIS — M25562 Pain in left knee: Secondary | ICD-10-CM | POA: Diagnosis not present

## 2018-12-11 DIAGNOSIS — M25562 Pain in left knee: Secondary | ICD-10-CM | POA: Diagnosis not present

## 2018-12-13 DIAGNOSIS — R69 Illness, unspecified: Secondary | ICD-10-CM | POA: Diagnosis not present

## 2018-12-15 DIAGNOSIS — M25562 Pain in left knee: Secondary | ICD-10-CM | POA: Diagnosis not present

## 2018-12-19 DIAGNOSIS — M25562 Pain in left knee: Secondary | ICD-10-CM | POA: Diagnosis not present

## 2018-12-29 DIAGNOSIS — M1711 Unilateral primary osteoarthritis, right knee: Secondary | ICD-10-CM | POA: Diagnosis not present

## 2018-12-29 DIAGNOSIS — M79652 Pain in left thigh: Secondary | ICD-10-CM | POA: Diagnosis not present

## 2018-12-29 DIAGNOSIS — M25561 Pain in right knee: Secondary | ICD-10-CM | POA: Diagnosis not present

## 2018-12-29 DIAGNOSIS — Z96652 Presence of left artificial knee joint: Secondary | ICD-10-CM | POA: Diagnosis not present

## 2019-01-12 ENCOUNTER — Ambulatory Visit
Admission: EM | Admit: 2019-01-12 | Discharge: 2019-01-12 | Disposition: A | Discharge status: Home / Self Care | Payer: Medicare HMO | Attending: Nurse Practitioner | Admitting: Nurse Practitioner

## 2019-01-12 ENCOUNTER — Encounter: Payer: Self-pay | Admitting: Emergency Medicine

## 2019-01-12 ENCOUNTER — Other Ambulatory Visit: Payer: Self-pay

## 2019-01-12 DIAGNOSIS — J3489 Other specified disorders of nose and nasal sinuses: Secondary | ICD-10-CM

## 2019-01-12 DIAGNOSIS — Z20822 Contact with and (suspected) exposure to covid-19: Secondary | ICD-10-CM

## 2019-01-12 DIAGNOSIS — Z20828 Contact with and (suspected) exposure to other viral communicable diseases: Secondary | ICD-10-CM | POA: Diagnosis not present

## 2019-01-12 MED ORDER — FLUTICASONE PROPIONATE 50 MCG/ACT NA SUSP: 2.0000 | Freq: Every day | NASAL | 0 refills | Status: DC

## 2019-01-12 NOTE — ED Triage Notes (Signed)
Patient in office for Covid test only no symptoms but has sinus drainage.  Wife was positive for covid

## 2019-01-12 NOTE — ED Provider Notes (Signed)
Joshua Stark    CSN: [id number] Arrival date & time: 01/12/19  1433      History   Chief Complaint Chief Complaint  Patient presents with  . covid exposure    HPI Joshua Stark is a 73 y.o. male.   Subjective:   Joshua Stark is a 73 y.o. male who presents for evaluation of COVID-like symptoms. Symptoms include sinus drainage and nausea.  Symptoms have been present for 1 week. He denies any fevers, chills, sweats, body aches, sore throat, cough, shortness of breath, vomiting, diarrhea, headache, dizziness or change in taste/smell.  He has tried to alleviate the symptoms with acetaminophen, rest and OTC agents with satisfactory relief. Patient is retired. He lives at home with his wife who tested positive for COVID 19 on 01/11/19. High risk factors for COVID complications include age greater than 34 years of age and co-morbid illnesses.  The following portions of the patient's history were reviewed and updated as appropriate: allergies, current medications, past family history, past medical history, past social history, past surgical history and problem list.        Past Medical History:  Diagnosis Date  . Arthritis   . CAD S/P percutaneous coronary angioplasty 1999   PCI- prox LAD @ D1, SP1 trifurcation.  3.0 mm x 16 mm AVE-GFX BMS  . History of nuclear stress test August 2012   Treadmill Myoview: 9 minutes, 10 METS --> hypertensive response.  No evidence of ischemia or infarction.  EF 64%  . HLD (hyperlipidemia)   . Hypertension   . Obesity (BMI 30-39.9)   . Seizure disorder ([place])   . Seizures ([place])    hx of years ago     Patient Active Problem List   Diagnosis Date Noted  . Femur fracture, left ([place]) 08/22/2018  . S/P left TKA 08/17/2016  . S/P total knee replacement 08/17/2016  . Bradycardia 03/19/2014  . Aortic ejection murmur 09/18/2013  . CKD (chronic kidney disease) stage 3, GFR 30-59 ml/min (HCC) 05/01/2013  . CAD S/P percutaneous coronary  angioplasty   . Essential hypertension   . Hyperlipidemia with target LDL less than 70   . Class 1 obesity with serious comorbidity and body mass index (BMI) of 30.0 to 30.9 in adult   . Hyperkalemia 07/27/2011  . Low testosterone in male 07/30/2009  . Anemia of chronic disease 07/29/2009  . CONSTIPATION 07/07/2009  . ABDOMINAL PAIN, LEFT LOWER QUADRANT 07/07/2009  . Seizure disorder ([place]) 10/13/2006    Past Surgical History:  Procedure Laterality Date  . CARDIAC CATHETERIZATION  01/20/1998  . CORONARY ANGIOPLASTY  01/20/1998   PCI to LAD ,prox LAD at trifurcation for D1and SP1.3.0-x16-mm AVE-GFX bare-metal stent  . FEMUR IM NAIL Left 08/23/2018   Procedure: INTRAMEDULLARY (IM) RETROGRADE FEMORAL NAILING;  Surgeon: Paralee Cancel, MD;  Location: [facility];  Service: Orthopedics;  Laterality: Left;  . NM MYOCAR PERF WALL MOTION  Aug 2 ,2012   treadmill myoview exercised 9 minutes, reacing 10 metaboblic equivlents,had ahypertensive reponse .EF64%  . TOTAL KNEE ARTHROPLASTY Left 08/17/2016   Procedure: LEFT TOTAL KNEE ARTHROPLASTY;  Surgeon: Paralee Cancel, MD;  Location: [facility];  Service: Orthopedics;  Laterality: Left;  . TRANSTHORACIC ECHOCARDIOGRAM  09/20/2013   normal LV size and function. EF 60-65%. Normal diastolic function. Mild LA dilation and mild AI.  No evidence of aortic stenosis. No significant MR.       Home Medications    Prior to Admission medications   Medication Sig  Start Date End Date Taking? Authorizing Provider  acetaminophen (TYLENOL) 500 MG tablet Take 2 tablets (1,000 mg total) by mouth every 8 (eight) hours. 08/25/18   Danae Orleans, PA-C  alum & mag hydroxide-simeth (MAALOX/MYLANTA) 200-200-20 MG/5ML suspension Take 30 mLs by mouth every 4 (four) hours as needed for indigestion. 08/26/18   Hosie Poisson, MD  amLODipine (NORVASC) 5 MG tablet Take 2 tablets (10 mg total) by mouth daily. 08/26/18   Hosie Poisson, MD  DEPAKOTE 250 MG DR tablet TAKE 1 TABLET BY MOUTH  TWICE A DAY 08/24/18   Bedsole, Amy E, MD  docusate sodium (COLACE) 100 MG capsule Take 1 capsule (100 mg total) by mouth 2 (two) times daily. 08/25/18   Danae Orleans, PA-C  Evolocumab with Infusor 420 MG/3.5ML SOCT Inject 3.5 mLs into the skin every 30 (thirty) days.  05/17/18   [provider]  feeding supplement (ENSURE SURGERY) LIQD Take 237 mLs by mouth 2 (two) times daily between meals. 08/26/18   Hosie Poisson, MD  ferrous sulfate (FERROUSUL) 325 (65 FE) MG tablet Take 1 tablet (325 mg total) by mouth 3 (three) times daily with meals for 14 days. 08/25/18 09/08/18  Danae Orleans, PA-C  fluticasone (FLONASE) 50 MCG/ACT nasal spray Place 2 sprays into both nostrils daily. 01/12/19   Enrique Sack, FNP  Garlic [id number] MG TABS Take 500 mg by mouth daily.    [provider]  methocarbamol (ROBAXIN) 500 MG tablet Take 1 tablet (500 mg total) by mouth every 6 (six) hours as needed for muscle spasms. 08/25/18   Danae Orleans, PA-C  Multiple Vitamin (MULTIVITAMIN) capsule Take 1 capsule by mouth daily.     [provider]  NITROSTAT 0.4 MG SL tablet DISSOLVE 1 TABLET UNDER THE TONGUE FOR CHEST PAIN. MAY REPEAT EVERY 5MINUTES UP TO 3 DOSES. IF NO RELIEF, CALL 911** Patient taking differently: Place 0.4 mg under the tongue every 5 (five) minutes as needed for chest pain.  06/26/15   Leonie Man, MD  oxyCODONE (OXY IR/ROXICODONE) 5 MG immediate release tablet Take 1-2 tablets (5-10 mg total) by mouth every 4 (four) hours as needed for moderate pain or severe pain. 08/25/18   Danae Orleans, PA-C  polyethylene glycol (MIRALAX / GLYCOLAX) 17 g packet Take 17 g by mouth 2 (two) times daily. 08/25/18   Danae Orleans, PA-C    Family History Family History  Problem Relation Age of Onset  . Hypertension Mother   . Cancer Father        lung and prostate    Social History Social History   Tobacco Use  . Smoking status: Never Smoker  . Smokeless tobacco: Never Used    Substance Use Topics  . Alcohol use: No    Alcohol/week: 0.0 standard drinks  . Drug use: No     Allergies   Fenofibrate, Rosuvastatin, Pravastatin sodium, and Statins   Review of Systems Review of Systems  Constitutional: Negative for fever.  HENT: Positive for postnasal drip.   Respiratory: Negative.   Cardiovascular: Negative.   Gastrointestinal: Positive for nausea.  Musculoskeletal: Negative.   Neurological: Negative.   All other systems reviewed and are negative.    Physical Exam Triage Vital Signs ED Triage Vitals  Enc Vitals Group     BP 01/12/19 1444 (!) 158/84     Pulse Rate 01/12/19 1444 63     Resp 01/12/19 1444 18     Temp 01/12/19 1444 98.1 F (36.7 C)     Temp  src --      SpO2 01/12/19 1444 98 %     Weight 01/12/19 1441 268 lb (121.6 kg)     Height --      Head Circumference --      Peak Flow --      Pain Score --      Pain Loc --      Pain Edu? --      Excl. in [place]? --    No data found.  Updated Vital Signs BP (!) 158/84 (BP Location: Left Arm)   Pulse 63   Temp 98.1 F (36.7 C)   Resp 18   Wt 268 lb (121.6 kg)   SpO2 98%   BMI 37.38 kg/m   Visual Acuity Right Eye Distance:   Left Eye Distance:   Bilateral Distance:    Right Eye Near:   Left Eye Near:    Bilateral Near:     Physical Exam Vitals reviewed.  Constitutional:      General: He is not in acute distress.    Appearance: Normal appearance. He is not ill-appearing or toxic-appearing.  HENT:     Head: Normocephalic.     Nose:     Right Sinus: No maxillary sinus tenderness or frontal sinus tenderness.     Left Sinus: No maxillary sinus tenderness or frontal sinus tenderness.  Cardiovascular:     Rate and Rhythm: Normal rate and regular rhythm.  Pulmonary:     Effort: Pulmonary effort is normal.  Musculoskeletal:        General: Normal range of motion.     Cervical back: Normal range of motion and neck supple.  Lymphadenopathy:     Cervical: No cervical  adenopathy.  Skin:    General: Skin is warm and dry.  Neurological:     General: No focal deficit present.     Mental Status: He is alert and oriented to person, place, and time.      UC Treatments / Results  Labs (all labs ordered are listed, but only abnormal results are displayed) Labs Reviewed  NOVEL CORONAVIRUS, NAA    EKG   Radiology No results found.  Procedures Procedures (including critical care time)  Medications Ordered in UC Medications - No data to display  Initial Impression / Assessment and Plan / UC Course  I have reviewed the triage vital signs and the nursing notes.  Pertinent labs & imaging results that were available during my care of the patient were reviewed by me and considered in my medical decision making (see chart for details).    73 yo male with multiple co-morbid conditions presents with a one-week history of sinus drainage and nausea. He has also had known exposure to COVID 19 within the home. He is feeling better with OTC supportive therapies. Patient is afebrile. Nontoxic appearing. Physical exam unremarkable. COVID PCR testing pending. Isolation and continue supportive care advised. Follow-up with PCP if no improvement in symptoms or go to ED if worse.   Today's evaluation has revealed no signs of a dangerous process. Discussed diagnosis with patient and/or guardian. Patient and/or guardian aware of their diagnosis, possible red flag symptoms to watch out for and need for close follow up. Patient and/or guardian understands verbal and written discharge instructions. Patient and/or guardian comfortable with plan and disposition.  Patient and/or guardian has a clear mental status at this time, good insight into illness (after discussion and teaching) and has clear judgment to make decisions regarding their care  This care was provided during an unprecedented National Emergency due to the Novel Coronavirus (COVID-19) pandemic. COVID-19 infections  and transmission risks place heavy strains on healthcare resources.  As this pandemic evolves, our facility, providers, and staff strive to respond fluidly, to remain operational, and to provide care relative to available resources and information. Outcomes are unpredictable and treatments are without well-defined guidelines. Further, the impact of COVID-19 on all aspects of urgent care, including the impact to patients seeking care for reasons other than COVID-19, is unavoidable during this national emergency. At this time of the global pandemic, management of patients has significantly changed, even for non-COVID positive patients given high local and regional COVID volumes at this time requiring high healthcare system and resource utilization. The standard of care for management of both COVID suspected and non-COVID suspected patients continues to change rapidly at the local, regional, national, and global levels. This patient was worked up and treated to the best available but ever changing evidence and resources available at this current time.   Documentation was completed with the aid of voice recognition software. Transcription may contain typographical errors. Final Clinical Impressions(s) / UC Diagnoses   Final diagnoses:  Exposure to SARS-associated coronavirus  Encounter for screening laboratory testing for COVID-19 virus  Sinus drainage     Discharge Instructions     Take medications as prescribed. Continue the medications you have been taking as well. You may take tylenol or ibuprofen as needed for fevers/headache/body aches. Drink plenty of fluids. Stay in home isolation until you receive results of your COVID test. You will only be notified for positive results. You may go online to MyChart in the next few days and review your results. Please follow CDC guidelines that are attached. You may discontinue home isolation when there has been at least 10 days since symptoms onset AND 3 days  fever free without antipyretics (Tylenol or Ibuprofen) AND an overall improvement in your symptoms. Go to the ED immediately if you get worse or have any other symptoms.   Feel better soon!  Merry Christmas!  Aldona Bar, FNP-C      ED Prescriptions    Medication Sig Dispense Auth. Provider   fluticasone (FLONASE) 50 MCG/ACT nasal spray Place 2 sprays into both nostrils daily. 9.9 mL Enrique Sack, FNP     PDMP not reviewed this encounter.   Enrique Sack, [place] 01/12/19 [phone]

## 2019-01-12 NOTE — Discharge Instructions (Signed)
Take medications as prescribed. Continue the medications you have been taking as well. You may take tylenol or ibuprofen as needed for fevers/headache/body aches. Drink plenty of fluids. Stay in home isolation until you receive results of your COVID test. You will only be notified for positive results. You may go online to MyChart in the next few days and review your results. Please follow CDC guidelines that are attached. You may discontinue home isolation when there has been at least 10 days since symptoms onset AND 3 days fever free without antipyretics (Tylenol or Ibuprofen) AND an overall improvement in your symptoms. Go to the ED immediately if you get worse or have any other symptoms.   Feel better soon!  Merry Christmas!  Aldona Bar, FNP-C

## 2019-01-14 LAB — NOVEL CORONAVIRUS, NAA: SARS-CoV-2, NAA: DETECTED — AB

## 2019-01-15 ENCOUNTER — Telehealth: Payer: Self-pay | Admitting: Nurse Practitioner

## 2019-01-15 ENCOUNTER — Encounter (HOSPITAL_COMMUNITY): Payer: Self-pay

## 2019-01-15 NOTE — Telephone Encounter (Signed)
Called to Discuss with patient about Covid symptoms and the use of bamlanivimab, a monoclonal antibody infusion for those with mild to moderate Covid symptoms and at a high risk of hospitalization.     Pt is qualified for this infusion at the [facility] infusion center due to co-morbid conditions and/or a member of an at-risk group.     Patient Active Problem List   Diagnosis Date Noted  . Femur fracture, left ([place]) 08/22/2018  . S/P left TKA 08/17/2016  . S/P total knee replacement 08/17/2016  . Bradycardia 03/19/2014  . Aortic ejection murmur 09/18/2013  . CKD (chronic kidney disease) stage 3, GFR 30-59 ml/min (HCC) 05/01/2013  . CAD S/P percutaneous coronary angioplasty   . Essential hypertension   . Hyperlipidemia with target LDL less than 70   . Class 1 obesity with serious comorbidity and body mass index (BMI) of 30.0 to 30.9 in adult   . Hyperkalemia 07/27/2011  . Low testosterone in male 07/30/2009  . Anemia of chronic disease 07/29/2009  . CONSTIPATION 07/07/2009  . ABDOMINAL PAIN, LEFT LOWER QUADRANT 07/07/2009  . Seizure disorder ([place]) 10/13/2006    Patient states symptoms started 14 days ago and therefore does not meet criteria for infusion. Patient states that he is much improved at this point.

## 2019-02-28 ENCOUNTER — Telehealth: Payer: Self-pay | Admitting: Family Medicine

## 2019-02-28 NOTE — Telephone Encounter (Signed)
Patient's daughter, Janace Hoard, called.  Patient received a letter from [employer] stating that they don't cover Depakote and they want him to change to a generic.  Angie said patient tried a generic before and patient was talking crazy.  Angie will fax letter he received. I'll put it in rx tower when I receive the fax. Angie said you can return call to patient.

## 2019-02-28 NOTE — Telephone Encounter (Signed)
Will review letter when received.  I am sure we can do a prior authorization to get Brand Name Depakote approved.

## 2019-02-28 NOTE — Telephone Encounter (Signed)
I spoke with Joshua Stark and advised we can do a Prior Authorization to try and get the brand name Depakote approved for him.  PA completed on CoverMyMeds and sent to [employer] for review.  It can take 3 business days for a decision.

## 2019-03-01 ENCOUNTER — Encounter: Payer: Self-pay | Admitting: *Deleted

## 2019-03-09 NOTE — Telephone Encounter (Signed)
PA was denied.  I left message for Mr. Goodgame to return my call to discuss options.

## 2019-03-12 NOTE — Telephone Encounter (Signed)
Spoke with Joshua Stark and advised him that his insurance denied the PA for Brand Name Depakote.  I told him our options would be to try the generic again or Dr. Diona Browner thought it would okay if he wanted to stop the Depakote all together because his levels have been subtherapeutic for years and he has not had any seizures.  Joshua Stark does not feel comfortable stopping the Depakote.  The other option would be to try and use a prescription saving coupon to see how much it would cost him to pay  out of pocket for the brand name medication.  Patient would like to try and use a coupon.  I printed off 2 different coupons for him to take to his pharmacy to see which one would give him a better deal.  I ask that if it is too expensive to pay out of pocket with coupon, to call me back so we can come up with another plan.  Patient states understanding. Coupons placed up front for patient to pick up.  FYI to Dr. Diona Browner.

## 2019-03-12 NOTE — Telephone Encounter (Signed)
Patient returned your call.

## 2019-05-31 ENCOUNTER — Other Ambulatory Visit: Payer: Self-pay

## 2019-05-31 ENCOUNTER — Ambulatory Visit
Admission: EM | Admit: 2019-05-31 | Discharge: 2019-05-31 | Disposition: A | Discharge status: Home / Self Care | Payer: Medicare Other

## 2019-05-31 ENCOUNTER — Encounter: Payer: Self-pay | Admitting: Emergency Medicine

## 2019-05-31 DIAGNOSIS — R5383 Other fatigue: Secondary | ICD-10-CM | POA: Diagnosis not present

## 2019-05-31 DIAGNOSIS — R0602 Shortness of breath: Secondary | ICD-10-CM

## 2019-05-31 NOTE — ED Provider Notes (Signed)
Joshua Stark    CSN: [id number] Arrival date & time: 05/31/19  1719      History   Chief Complaint Chief Complaint  Patient presents with  . Fatigue    HPI Joshua Stark is a 74 y.o. male.   Patient presents with ongoing fatigue and mild shortness of breath since having COVID in December 2020.  He denies cough, fever, chills, chest pain, dizziness, palpitations, abdominal pain, or other symptoms.  He has a history of anemia of chronic disease, heart disease, hypertension, hyperlipidemia, seizures, CKD, and other comorbidites.  Patient reports he has an appointment with his cardiology on 06/13/2019.    The history is provided by the patient.    Past Medical History:  Diagnosis Date  . Arthritis   . CAD S/P percutaneous coronary angioplasty 1999   PCI- prox LAD @ D1, SP1 trifurcation.  3.0 mm x 16 mm AVE-GFX BMS  . History of nuclear stress test August 2012   Treadmill Myoview: 9 minutes, 10 METS --> hypertensive response.  No evidence of ischemia or infarction.  EF 64%  . HLD (hyperlipidemia)   . Hypertension   . Obesity (BMI 30-39.9)   . Seizure disorder ([place])   . Seizures ([place])    hx of years ago     Patient Active Problem List   Diagnosis Date Noted  . Closed fracture of distal end of left femur ([place]) 09/01/2018  . Femur fracture, left ([place]) 08/22/2018  . S/P left TKA 08/17/2016  . S/P total knee replacement 08/17/2016  . Bradycardia 03/19/2014  . Aortic ejection murmur 09/18/2013  . CKD (chronic kidney disease) stage 3, GFR 30-59 ml/min ([facility]) 05/01/2013  . CAD S/P percutaneous coronary angioplasty   . Essential hypertension   . Hyperlipidemia with target LDL less than 70   . Class 1 obesity with serious comorbidity and body mass index (BMI) of 30.0 to 30.9 in adult   . Hyperkalemia 07/27/2011  . Low testosterone in male 07/30/2009  . Anemia of chronic disease 07/29/2009  . CONSTIPATION 07/07/2009  . ABDOMINAL PAIN, LEFT LOWER QUADRANT 07/07/2009  .  Seizure disorder ([place]) 10/13/2006    Past Surgical History:  Procedure Laterality Date  . CARDIAC CATHETERIZATION  01/20/1998  . CORONARY ANGIOPLASTY  01/20/1998   PCI to LAD ,prox LAD at trifurcation for D1and SP1.3.0-x16-mm AVE-GFX bare-metal stent  . FEMUR IM NAIL Left 08/23/2018   Procedure: INTRAMEDULLARY (IM) RETROGRADE FEMORAL NAILING;  Surgeon: Paralee Cancel, MD;  Location: [facility];  Service: Orthopedics;  Laterality: Left;  . NM MYOCAR PERF WALL MOTION  Aug 2 ,2012   treadmill myoview exercised 9 minutes, reacing 10 metaboblic equivlents,had ahypertensive reponse .EF64%  . TOTAL KNEE ARTHROPLASTY Left 08/17/2016   Procedure: LEFT TOTAL KNEE ARTHROPLASTY;  Surgeon: Paralee Cancel, MD;  Location: [facility];  Service: Orthopedics;  Laterality: Left;  . TRANSTHORACIC ECHOCARDIOGRAM  09/20/2013   normal LV size and function. EF 60-65%. Normal diastolic function. Mild LA dilation and mild AI.  No evidence of aortic stenosis. No significant MR.       Home Medications    Prior to Admission medications   Medication Sig Start Date End Date Taking? Authorizing Provider  amLODipine (NORVASC) 5 MG tablet Take 2 tablets (10 mg total) by mouth daily. 08/26/18  Yes Hosie Poisson, MD  DEPAKOTE 250 MG DR tablet TAKE 1 TABLET BY MOUTH TWICE A DAY 08/24/18  Yes Bedsole, Amy E, MD  Evolocumab with Infusor 420 MG/3.5ML SOCT Inject 3.5 mLs  into the skin every 30 (thirty) days.  05/17/18  Yes [provider]  Garlic [id number] MG TABS Take 500 mg by mouth daily.   Yes [provider]  lisinopril (ZESTRIL) 20 MG tablet Take 10 mg by mouth daily. 02/19/19  Yes [provider]  methocarbamol (ROBAXIN) 500 MG tablet Take 1 tablet (500 mg total) by mouth every 6 (six) hours as needed for muscle spasms. 08/25/18  Yes Babish, Rodman Key, PA-C  Multiple Vitamin (MULTIVITAMIN) capsule Take 1 capsule by mouth daily.    Yes [provider]  NITROSTAT 0.4 MG SL tablet DISSOLVE 1 TABLET UNDER THE  TONGUE FOR CHEST PAIN. MAY REPEAT EVERY 5MINUTES UP TO 3 DOSES. IF NO RELIEF, CALL 911** Patient taking differently: Place 0.4 mg under the tongue every 5 (five) minutes as needed for chest pain.  06/26/15  Yes Leonie Man, MD  acetaminophen (TYLENOL) 500 MG tablet Take 2 tablets (1,000 mg total) by mouth every 8 (eight) hours. 08/25/18   Danae Orleans, PA-C  alum & mag hydroxide-simeth (MAALOX/MYLANTA) 200-200-20 MG/5ML suspension Take 30 mLs by mouth every 4 (four) hours as needed for indigestion. 08/26/18   Hosie Poisson, MD  docusate sodium (COLACE) 100 MG capsule Take 1 capsule (100 mg total) by mouth 2 (two) times daily. 08/25/18   Danae Orleans, PA-C  feeding supplement (ENSURE SURGERY) LIQD Take 237 mLs by mouth 2 (two) times daily between meals. 08/26/18   Hosie Poisson, MD  ferrous sulfate (FERROUSUL) 325 (65 FE) MG tablet Take 1 tablet (325 mg total) by mouth 3 (three) times daily with meals for 14 days. 08/25/18 09/08/18  Danae Orleans, PA-C  fluticasone (FLONASE) 50 MCG/ACT nasal spray Place 2 sprays into both nostrils daily. 01/12/19   Enrique Sack, FNP  oxyCODONE (OXY IR/ROXICODONE) 5 MG immediate release tablet Take 1-2 tablets (5-10 mg total) by mouth every 4 (four) hours as needed for moderate pain or severe pain. 08/25/18   Danae Orleans, PA-C  polyethylene glycol (MIRALAX / GLYCOLAX) 17 g packet Take 17 g by mouth 2 (two) times daily. 08/25/18   Danae Orleans, PA-C    Family History Family History  Problem Relation Age of Onset  . Hypertension Mother   . Cancer Father        lung and prostate    Social History Social History   Tobacco Use  . Smoking status: Never Smoker  . Smokeless tobacco: Never Used  Substance Use Topics  . Alcohol use: No    Alcohol/week: 0.0 standard drinks  . Drug use: No     Allergies   Fenofibrate, Rosuvastatin, Pravastatin sodium, and Statins   Review of Systems Review of Systems  Constitutional: Positive for fatigue.  Negative for chills and fever.  HENT: Negative for congestion, ear pain, rhinorrhea and sore throat.   Eyes: Negative for pain and visual disturbance.  Respiratory: Positive for shortness of breath. Negative for cough.   Cardiovascular: Negative for chest pain and palpitations.  Gastrointestinal: Negative for abdominal pain and vomiting.  Genitourinary: Negative for dysuria and hematuria.  Musculoskeletal: Negative for arthralgias and back pain.  Skin: Negative for color change and rash.  Neurological: Negative for seizures and syncope.  All other systems reviewed and are negative.    Physical Exam Triage Vital Signs ED Triage Vitals  Enc Vitals Group     BP      Pulse      Resp      Temp      Temp src  SpO2      Weight      Height      Head Circumference      Peak Flow      Pain Score      Pain Loc      Pain Edu?      Excl. in [place]?    No data found.  Updated Vital Signs BP (!) 122/51 (BP Location: Left Arm)   Pulse 60   Temp 98.6 F (37 C) (Oral)   Resp 18   Ht 5\' 11"  (1.803 m)   Wt 275 lb (124.7 kg)   SpO2 98%   BMI 38.35 kg/m   Visual Acuity Right Eye Distance:   Left Eye Distance:   Bilateral Distance:    Right Eye Near:   Left Eye Near:    Bilateral Near:     Physical Exam Vitals and nursing note reviewed.  Constitutional:      General: He is not in acute distress.    Appearance: He is well-developed.  HENT:     Head: Normocephalic and atraumatic.     Mouth/Throat:     Mouth: Mucous membranes are moist.     Pharynx: Oropharynx is clear.  Eyes:     Conjunctiva/sclera: Conjunctivae normal.  Cardiovascular:     Rate and Rhythm: Normal rate and regular rhythm.     Heart sounds: Normal heart sounds. No murmur.  Pulmonary:     Effort: Pulmonary effort is normal. No respiratory distress.     Breath sounds: Normal breath sounds. No wheezing or rhonchi.  Abdominal:     Palpations: Abdomen is soft.     Tenderness: There is no abdominal  tenderness. There is no guarding or rebound.  Musculoskeletal:     Cervical back: Neck supple.     Right lower leg: No edema.     Left lower leg: No edema.  Skin:    General: Skin is warm and dry.  Neurological:     General: No focal deficit present.     Mental Status: He is alert and oriented to person, place, and time.     Gait: Gait normal.  Psychiatric:        Mood and Affect: Mood normal.        Behavior: Behavior normal.      UC Treatments / Results  Labs (all labs ordered are listed, but only abnormal results are displayed) Labs Reviewed  CBC - Abnormal; Notable for the following components:      Result Value   RBC 4.07 (*)    Hemoglobin 12.4 (*)    Hematocrit 37.3 (*)    All other components within normal limits   Narrative:    Performed at:  [address] [place], [place]  [id number] Lab Director: Rush Farmer MD, Phone:  [phone]  BASIC METABOLIC PANEL - Abnormal; Notable for the following components:   Glucose 139 (*)    BUN 32 (*)    Creatinine, Ser 1.36 (*)    GFR calc non Af Amer 52 (*)    All other components within normal limits   Narrative:    Performed at:  [facility] [address], [place], [place]  [id number] Lab Director: Rush Farmer MD, Phone:  [phone]    EKG   Radiology No results found.  Procedures Procedures (including critical care time)  Medications Ordered in UC Medications - No data to display  Initial Impression / Assessment and Plan / UC  Course  I have reviewed the triage vital signs and the nursing notes.  Pertinent labs & imaging results that were available during my care of the patient were reviewed by me and considered in my medical decision making (see chart for details).   Fatigue, shortness of breath.  CBC and BMP results discussed with patient.  Instructed patient to call his PCP in the morning to schedule an appointment as soon as possible for evaluation of his ongoing  fatigue and shortness of breath.  Instructed him to keep his appointment with his cardiologist as scheduled.  Instructed him to go to the ED if he has acute worsening symptoms.  Patient agrees to plan of care.      Final Clinical Impressions(s) / UC Diagnoses   Final diagnoses:  Fatigue, unspecified type  Shortness of breath     Discharge Instructions     Your blood work will be back in the morning.  I will call you with the results.    Call your primary care provider in the morning to schedule an appointment to discuss your ongoing fatigue and shortness of breath.  Follow-up as scheduled with your cardiologist.    Go to the emergency department if you have acute worsening symptoms.        ED Prescriptions    None     PDMP not reviewed this encounter.   Sharion Balloon, NP 06/01/19 [phone]

## 2019-05-31 NOTE — Discharge Instructions (Addendum)
Your blood work will be back in the morning.  I will call you with the results.    Call your primary care provider in the morning to schedule an appointment to discuss your ongoing fatigue and shortness of breath.  Follow-up as scheduled with your cardiologist.    Go to the emergency department if you have acute worsening symptoms.

## 2019-05-31 NOTE — ED Triage Notes (Signed)
Patient in today c/o increased fatigue x 1 month. Patient states he had Covid 12/2018. Patient has an appointment with his cardiologist 06/13/19.

## 2019-06-01 DIAGNOSIS — I2510 Atherosclerotic heart disease of native coronary artery without angina pectoris: Secondary | ICD-10-CM | POA: Diagnosis not present

## 2019-06-01 DIAGNOSIS — R0609 Other forms of dyspnea: Secondary | ICD-10-CM | POA: Diagnosis not present

## 2019-06-01 DIAGNOSIS — Z0000 Encounter for general adult medical examination without abnormal findings: Secondary | ICD-10-CM | POA: Diagnosis not present

## 2019-06-01 DIAGNOSIS — R0600 Dyspnea, unspecified: Secondary | ICD-10-CM | POA: Diagnosis not present

## 2019-06-01 DIAGNOSIS — G933 Postviral fatigue syndrome: Secondary | ICD-10-CM | POA: Diagnosis not present

## 2019-06-01 DIAGNOSIS — R9431 Abnormal electrocardiogram [ECG] [EKG]: Secondary | ICD-10-CM | POA: Diagnosis not present

## 2019-06-01 DIAGNOSIS — R011 Cardiac murmur, unspecified: Secondary | ICD-10-CM | POA: Diagnosis not present

## 2019-06-01 DIAGNOSIS — R001 Bradycardia, unspecified: Secondary | ICD-10-CM | POA: Diagnosis not present

## 2019-06-01 DIAGNOSIS — R0602 Shortness of breath: Secondary | ICD-10-CM | POA: Diagnosis not present

## 2019-06-01 LAB — BASIC METABOLIC PANEL
BUN/Creatinine Ratio: 24 (ref 10–24)
BUN: 32 mg/dL — ABNORMAL HIGH (ref 8–27)
CO2: 22 mmol/L (ref 20–29)
Calcium: 9.7 mg/dL (ref 8.6–10.2)
Chloride: 106 mmol/L (ref 96–106)
Creatinine, Ser: 1.36 mg/dL — ABNORMAL HIGH (ref 0.76–1.27)
GFR calc Af Amer: 60 mL/min/{1.73_m2} (ref >59)
GFR calc non Af Amer: 52 mL/min/{1.73_m2} — ABNORMAL LOW (ref >59)
Glucose: 139 mg/dL — ABNORMAL HIGH (ref 65–99)
Potassium: 4.6 mmol/L (ref 3.5–5.2)
Sodium: 139 mmol/L (ref 134–144)

## 2019-06-01 LAB — CBC
Hematocrit: 37.3 % — ABNORMAL LOW (ref 37.5–51.0)
Hemoglobin: 12.4 g/dL — ABNORMAL LOW (ref 13.0–17.7)
MCH: 30.5 pg (ref 26.6–33.0)
MCHC: 33.2 g/dL (ref 31.5–35.7)
MCV: 92 fL (ref 79–97)
Platelets: 207 10*3/uL (ref 150–450)
RBC: 4.07 x10E6/uL — ABNORMAL LOW (ref 4.14–5.80)
RDW: 12.3 % (ref 11.6–15.4)
WBC: 6.1 10*3/uL (ref 3.4–10.8)

## 2019-06-06 DIAGNOSIS — M25562 Pain in left knee: Secondary | ICD-10-CM | POA: Diagnosis not present

## 2019-06-06 DIAGNOSIS — M9712XA Periprosthetic fracture around internal prosthetic left knee joint, initial encounter: Secondary | ICD-10-CM | POA: Diagnosis not present

## 2019-06-13 DIAGNOSIS — Z789 Other specified health status: Secondary | ICD-10-CM | POA: Diagnosis not present

## 2019-06-13 DIAGNOSIS — N182 Chronic kidney disease, stage 2 (mild): Secondary | ICD-10-CM | POA: Diagnosis not present

## 2019-06-13 DIAGNOSIS — E782 Mixed hyperlipidemia: Secondary | ICD-10-CM | POA: Diagnosis not present

## 2019-06-13 DIAGNOSIS — I10 Essential (primary) hypertension: Secondary | ICD-10-CM | POA: Diagnosis not present

## 2019-06-13 DIAGNOSIS — I2510 Atherosclerotic heart disease of native coronary artery without angina pectoris: Secondary | ICD-10-CM | POA: Diagnosis not present

## 2019-06-19 DIAGNOSIS — I2510 Atherosclerotic heart disease of native coronary artery without angina pectoris: Secondary | ICD-10-CM | POA: Diagnosis not present

## 2019-06-19 DIAGNOSIS — R9439 Abnormal result of other cardiovascular function study: Secondary | ICD-10-CM | POA: Diagnosis not present

## 2019-06-19 DIAGNOSIS — Z789 Other specified health status: Secondary | ICD-10-CM | POA: Diagnosis not present

## 2019-06-19 DIAGNOSIS — N182 Chronic kidney disease, stage 2 (mild): Secondary | ICD-10-CM | POA: Diagnosis not present

## 2019-06-19 DIAGNOSIS — I10 Essential (primary) hypertension: Secondary | ICD-10-CM | POA: Diagnosis not present

## 2019-06-19 DIAGNOSIS — E782 Mixed hyperlipidemia: Secondary | ICD-10-CM | POA: Diagnosis not present

## 2019-06-19 DIAGNOSIS — E785 Hyperlipidemia, unspecified: Secondary | ICD-10-CM | POA: Diagnosis not present

## 2019-06-29 ENCOUNTER — Other Ambulatory Visit: Payer: Self-pay | Admitting: Family Medicine

## 2019-06-29 ENCOUNTER — Telehealth: Payer: Self-pay

## 2019-06-29 ENCOUNTER — Telehealth: Payer: Self-pay | Admitting: Family Medicine

## 2019-06-29 ENCOUNTER — Ambulatory Visit: Payer: Medicare HMO

## 2019-06-29 ENCOUNTER — Other Ambulatory Visit (INDEPENDENT_AMBULATORY_CARE_PROVIDER_SITE_OTHER): Payer: Medicare Other

## 2019-06-29 DIAGNOSIS — D638 Anemia in other chronic diseases classified elsewhere: Secondary | ICD-10-CM

## 2019-06-29 DIAGNOSIS — T50905A Adverse effect of unspecified drugs, medicaments and biological substances, initial encounter: Secondary | ICD-10-CM

## 2019-06-29 DIAGNOSIS — E875 Hyperkalemia: Secondary | ICD-10-CM | POA: Diagnosis not present

## 2019-06-29 DIAGNOSIS — E785 Hyperlipidemia, unspecified: Secondary | ICD-10-CM

## 2019-06-29 LAB — BASIC METABOLIC PANEL
BUN: 44 mg/dL — ABNORMAL HIGH (ref 6–23)
CO2: 23 mEq/L (ref 19–32)
Calcium: 9.7 mg/dL (ref 8.4–10.5)
Chloride: 106 mEq/L (ref 96–112)
Creatinine, Ser: 1.41 mg/dL (ref 0.40–1.50)
GFR: 49.33 mL/min — ABNORMAL LOW (ref >60.00)
Glucose, Bld: 100 mg/dL — ABNORMAL HIGH (ref 70–99)
Potassium: 5.4 mEq/L — ABNORMAL HIGH (ref 3.5–5.1)
Sodium: 137 mEq/L (ref 135–145)

## 2019-06-29 MED ORDER — FUROSEMIDE 20 MG PO TABS
20.0000 mg | ORAL_TABLET | Freq: Every day | ORAL | 0 refills | Status: DC
Start: 2019-06-29 — End: 2021-10-06

## 2019-06-29 NOTE — Telephone Encounter (Signed)
Noted.  Added to medication list.

## 2019-06-29 NOTE — Telephone Encounter (Signed)
Patient contacted the office. He states he was just speaking with Butch Penny, who had asked if he was eating foods high in potassium. He states he just recalls that he has been drinking a lot of the Vitamin Water drinks. He does not know if this would make a change in his potassium, but just wanted you to be aware.

## 2019-06-29 NOTE — Telephone Encounter (Signed)
Patient called back and requested a call   He stated that the fluid pill he is taking is Eplerenone 25mg  1 tablet a day

## 2019-06-29 NOTE — Telephone Encounter (Signed)
-----   Message from Ellamae Sia sent at 06/26/2019  9:57 AM EDT ----- Regarding: lab orders for Friday, 6.4.21  AWV lab orders, please.

## 2019-07-03 ENCOUNTER — Other Ambulatory Visit: Payer: Self-pay

## 2019-07-03 ENCOUNTER — Ambulatory Visit: Payer: Self-pay

## 2019-07-03 ENCOUNTER — Other Ambulatory Visit (INDEPENDENT_AMBULATORY_CARE_PROVIDER_SITE_OTHER): Payer: Medicare Other

## 2019-07-03 DIAGNOSIS — E785 Hyperlipidemia, unspecified: Secondary | ICD-10-CM

## 2019-07-03 LAB — COMPREHENSIVE METABOLIC PANEL
ALT: 19 U/L (ref 0–53)
AST: 19 U/L (ref 0–37)
Albumin: 4 g/dL (ref 3.5–5.2)
Alkaline Phosphatase: 78 U/L (ref 39–117)
BUN: 40 mg/dL — ABNORMAL HIGH (ref 6–23)
CO2: 25 mEq/L (ref 19–32)
Calcium: 9.7 mg/dL (ref 8.4–10.5)
Chloride: 110 mEq/L (ref 96–112)
Creatinine, Ser: 1.23 mg/dL (ref 0.40–1.50)
GFR: 57.75 mL/min — ABNORMAL LOW (ref >60.00)
Glucose, Bld: 97 mg/dL (ref 70–99)
Potassium: 5.1 mEq/L (ref 3.5–5.1)
Sodium: 139 mEq/L (ref 135–145)
Total Bilirubin: 0.3 mg/dL (ref 0.2–1.2)
Total Protein: 6.2 g/dL (ref 6.0–8.3)

## 2019-07-03 LAB — LIPID PANEL
Cholesterol: 136 mg/dL (ref 0–200)
HDL: 40.7 mg/dL (ref >39.00)
LDL Cholesterol: 78 mg/dL (ref 0–99)
NonHDL: 95.09
Total CHOL/HDL Ratio: 3
Triglycerides: 85 mg/dL (ref 0.0–149.0)
VLDL: 17 mg/dL (ref 0.0–40.0)

## 2019-07-03 LAB — HEMOGLOBIN A1C: Hgb A1c MFr Bld: 6.2 % (ref 4.6–6.5)

## 2019-07-03 NOTE — Progress Notes (Signed)
No critical labs need to be addressed urgently. We will discuss labs in detail at upcoming office visit.   

## 2019-07-04 ENCOUNTER — Ambulatory Visit (INDEPENDENT_AMBULATORY_CARE_PROVIDER_SITE_OTHER): Payer: Medicare Other

## 2019-07-04 DIAGNOSIS — Z0000 Encounter for general adult medical examination without abnormal findings: Secondary | ICD-10-CM

## 2019-07-04 NOTE — Progress Notes (Signed)
Subjective:   SAHIR TOLSON is a 74 y.o. male who presents for Medicare Annual/Subsequent preventive examination.  Review of Systems: N/A   I connected with the patient today by telephone and verified that I am speaking with the correct person using two identifiers. Location patient: home Location nurse: work Persons participating in the virtual visit: patient, Marine scientist.   I discussed the limitations, risks, security and privacy concerns of performing an evaluation and management service by telephone and the availability of in person appointments. I also discussed with the patient that there may be a patient responsible charge related to this service. The patient expressed understanding and verbally consented to this telephonic visit.    Interactive audio and video telecommunications were attempted between this nurse and patient, however failed, due to patient having technical difficulties OR patient did not have access to video capability.  We continued and completed visit with audio only.     Cardiac Risk Factors include: advanced age (>63men, >78 women);hypertension;dyslipidemia;male gender     Objective:    Vitals: There were no vitals taken for this visit.  There is no height or weight on file to calculate BMI.  Advanced Directives 07/04/2019 08/23/2018 08/23/2018 08/22/2018 06/28/2018 11/03/2016 08/17/2016  Does Patient Have a Medical Advance Directive? Yes Yes Yes No Yes Yes Yes  Type of Paramedic of [place];Living will Living will Living will - [place];Living will [place];Living will [place];Living will  Does patient want to make changes to medical advance directive? - No - Patient declined No - Patient declined - No - Patient declined - No - Patient declined  Copy of [place] in Chart? No - copy requested - - - No - copy requested No - copy requested No - copy requested  Would  patient like information on creating a medical advance directive? - No - Patient declined No - Patient declined Yes (ED - Information included in AVS) - - -    Tobacco Social History   Tobacco Use  Smoking Status Never Smoker  Smokeless Tobacco Never Used     Counseling given: Not Answered   Clinical Intake:  Pre-visit preparation completed: Yes  Pain : No/denies pain     Nutritional Risks: None Diabetes: No  How often do you need to have someone help you when you read instructions, pamphlets, or other written materials from your doctor or pharmacy?: 1 - Never What is the last grade level you completed in school?: 11th  Interpreter Needed?: No  Information entered by :: CJohnson, LPN  Past Medical History:  Diagnosis Date  . Arthritis   . CAD S/P percutaneous coronary angioplasty 1999   PCI- prox LAD @ D1, SP1 trifurcation.  3.0 mm x 16 mm AVE-GFX BMS  . History of nuclear stress test August 2012   Treadmill Myoview: 9 minutes, 10 METS --> hypertensive response.  No evidence of ischemia or infarction.  EF 64%  . HLD (hyperlipidemia)   . Hypertension   . Obesity (BMI 30-39.9)   . Seizure disorder ([place])   . Seizures ([place])    hx of years ago    Past Surgical History:  Procedure Laterality Date  . CARDIAC CATHETERIZATION  01/20/1998  . CORONARY ANGIOPLASTY  01/20/1998   PCI to LAD ,prox LAD at trifurcation for D1and SP1.3.0-x16-mm AVE-GFX bare-metal stent  . FEMUR IM NAIL Left 08/23/2018   Procedure: INTRAMEDULLARY (IM) RETROGRADE FEMORAL NAILING;  Surgeon: Paralee Cancel,  MD;  Location: [facility];  Service: Orthopedics;  Laterality: Left;  . NM MYOCAR PERF WALL MOTION  Aug 2 ,2012   treadmill myoview exercised 9 minutes, reacing 10 metaboblic equivlents,had ahypertensive reponse .EF64%  . TOTAL KNEE ARTHROPLASTY Left 08/17/2016   Procedure: LEFT TOTAL KNEE ARTHROPLASTY;  Surgeon: Paralee Cancel, MD;  Location: [facility];  Service: Orthopedics;  Laterality: Left;  .  TRANSTHORACIC ECHOCARDIOGRAM  09/20/2013   normal LV size and function. EF 60-65%. Normal diastolic function. Mild LA dilation and mild AI.  No evidence of aortic stenosis. No significant MR.   Family History  Problem Relation Age of Onset  . Hypertension Mother   . Cancer Father        lung and prostate   Social History   Socioeconomic History  . Marital status: Married    Spouse name: Not on file  . Number of children: 2  . Years of education: Not on file  . Highest education level: Not on file  Occupational History  . Not on file  Tobacco Use  . Smoking status: Never Smoker  . Smokeless tobacco: Never Used  Substance and Sexual Activity  . Alcohol use: No    Alcohol/week: 0.0 standard drinks  . Drug use: No  . Sexual activity: Not on file  Other Topics Concern  . Not on file  Social History Narrative   He is a married father of 2, grandfather 51.   He usually walks 5 days a week.   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  He does a lot of activity and strenuous labor with this as well.   He never smoked, and does not drink alcohol.   Social Determinants of Health   Financial Resource Strain: Low Risk   . Difficulty of Paying Living Expenses: Not hard at all  Food Insecurity: No Food Insecurity  . Worried About Charity fundraiser in the Last Year: Never true  . Ran Out of Food in the Last Year: Never true  Transportation Needs: No Transportation Needs  . Lack of Transportation (Medical): No  . Lack of Transportation (Non-Medical): No  Physical Activity: Sufficiently Active  . Days of Exercise per Week: 7 days  . Minutes of Exercise per Session: 30 min  Stress: No Stress Concern Present  . Feeling of Stress : Not at all  Social Connections:   . Frequency of Communication with Friends and Family:   . Frequency of Social Gatherings with Friends and Family:   . Attends Religious Services:   . Active Member of Clubs or Organizations:     . Attends Archivist Meetings:   Marland Kitchen Marital Status:     Outpatient Encounter Medications as of 07/04/2019  Medication Sig  . acetaminophen (TYLENOL) 500 MG tablet Take 2 tablets (1,000 mg total) by mouth every 8 (eight) hours.  Marland Kitchen alum & mag hydroxide-simeth (MAALOX/MYLANTA) 200-200-20 MG/5ML suspension Take 30 mLs by mouth every 4 (four) hours as needed for indigestion.  Marland Kitchen amLODipine (NORVASC) 5 MG tablet Take 2 tablets (10 mg total) by mouth daily.  Marland Kitchen DEPAKOTE 250 MG DR tablet TAKE 1 TABLET BY MOUTH TWICE A DAY  . docusate sodium (COLACE) 100 MG capsule Take 1 capsule (100 mg total) by mouth 2 (two) times daily.  Marland Kitchen eplerenone (INSPRA) 25 MG tablet Take 25 mg by mouth daily.  . Evolocumab with Infusor 420 MG/3.5ML SOCT Inject 3.5 mLs into the skin every 30 (thirty) days.   Marland Kitchen  feeding supplement (ENSURE SURGERY) LIQD Take 237 mLs by mouth 2 (two) times daily between meals.  . fluticasone (FLONASE) 50 MCG/ACT nasal spray Place 2 sprays into both nostrils daily.  . furosemide (LASIX) 20 MG tablet Take 1 tablet (20 mg total) by mouth daily.  . Garlic 474 MG TABS Take 500 mg by mouth daily.  Marland Kitchen lisinopril (ZESTRIL) 20 MG tablet Take 10 mg by mouth daily.  . methocarbamol (ROBAXIN) 500 MG tablet Take 1 tablet (500 mg total) by mouth every 6 (six) hours as needed for muscle spasms.  . Multiple Vitamin (MULTIVITAMIN) capsule Take 1 capsule by mouth daily.   Marland Kitchen NITROSTAT 0.4 MG SL tablet DISSOLVE 1 TABLET UNDER THE TONGUE FOR CHEST PAIN. MAY REPEAT EVERY 5MINUTES UP TO 3 DOSES. IF NO RELIEF, CALL 911** (Patient taking differently: Place 0.4 mg under the tongue every 5 (five) minutes as needed for chest pain. )  . oxyCODONE (OXY IR/ROXICODONE) 5 MG immediate release tablet Take 1-2 tablets (5-10 mg total) by mouth every 4 (four) hours as needed for moderate pain or severe pain.  . polyethylene glycol (MIRALAX / GLYCOLAX) 17 g packet Take 17 g by mouth 2 (two) times daily.  . ferrous sulfate  (FERROUSUL) 325 (65 FE) MG tablet Take 1 tablet (325 mg total) by mouth 3 (three) times daily with meals for 14 days.   No facility-administered encounter medications on file as of 07/04/2019.    Activities of Daily Living In your present state of health, do you have any difficulty performing the following activities: 07/04/2019 08/23/2018  Hearing? N N  Vision? N N  Difficulty concentrating or making decisions? N N  Walking or climbing stairs? N Y  Dressing or bathing? N N  Doing errands, shopping? N N  Preparing Food and eating ? N -  Using the Toilet? N -  In the past six months, have you accidently leaked urine? N -  Do you have problems with loss of bowel control? N -  Managing your Medications? N -  Managing your Finances? N -  Housekeeping or managing your Housekeeping? N -  Some recent data might be hidden    Patient Care Team: Jinny Sanders, MD as PCP - General   Assessment:   This is a routine wellness examination for [place].  Exercise Activities and Dietary recommendations Current Exercise Habits: The patient does not participate in regular exercise at present;Home exercise routine, Type of exercise: walking, Time (Minutes): 30, Frequency (Times/Week): 7, Weekly Exercise (Minutes/Week): 210, Intensity: Moderate, Exercise limited by: None identified  Goals    . Increase physical activity     Starting 06/28/2018, I will continue to walk 2-3 days per week.     . Patient Stated     07/04/2019, I will continue to walk everyday for about 30 minutes.        Fall Risk Fall Risk  07/04/2019 06/28/2018 11/03/2016 11/01/2013  Falls in the past year? 1 0 No No  Comment tripped in shop while working - - -  Number falls in past yr: 0 - - -  Injury with Fall? 1 - - -  Comment broke leg - - -  Risk for fall due to : Medication side effect - - -  Follow up Falls evaluation completed;Falls prevention discussed - - -   Is the patient's home free of loose throw rugs in walkways, pet beds,  electrical cords, etc?   yes      Grab bars in  the bathroom? yes      Handrails on the stairs?   yes      Adequate lighting?   yes  Timed Get Up and Go Performed: N/A  Depression Screen PHQ 2/9 Scores 07/04/2019 06/28/2018 11/03/2016 05/03/2014  PHQ - 2 Score 0 0 0 0  PHQ- 9 Score 0 0 0 -    Cognitive Function MMSE - Mini Mental State Exam 07/04/2019 06/28/2018 11/03/2016  Orientation to time 5 5 5   Orientation to Place 5 5 5   Registration 3 3 3   Attention/ Calculation 5 0 0  Recall 3 3 3   Language- name 2 objects - 0 0  Language- repeat 1 1 1   Language- follow 3 step command - 0 3  Language- read & follow direction - 0 0  Write a sentence - 0 0  Copy design - 0 0  Total score - 17 20  Mini Cog  Mini-Cog screen was completed. Maximum score is 22. A value of 0 denotes this part of the MMSE was not completed or the patient failed this part of the Mini-Cog screening.       Immunization History  Administered Date(s) Administered  . Influenza Split 11/26/2010, 11/12/2011  . Influenza Whole 10/25/2008, 10/21/2009  . Influenza, High Dose Seasonal PF 10/25/2018  . Influenza,inj,Quad PF,6+ Mos 10/31/2012, 11/01/2013, 11/05/2014, 11/11/2015, 11/23/2016  . Influenza-Unspecified 12/14/2017  . Pneumococcal Conjugate-13 11/01/2013  . Pneumococcal Polysaccharide-23 11/05/2014  . Td 08/06/2003  . Tdap 11/01/2013    Qualifies for Shingles Vaccine: Yes  Screening Tests Health Maintenance  Topic Date Due  . COVID-19 Vaccine (1) Never done  . INFLUENZA VACCINE  08/26/2019  . COLONOSCOPY  12/10/2020  . TETANUS/TDAP  11/02/2023  . Hepatitis C Screening  Completed  . PNA vac Low Risk Adult  Completed   Cancer Screenings: Lung: Low Dose CT Chest recommended if Age 27-80 years, 30 pack-year currently smoking OR have quit w/in 15 years. Patient does not qualify. Colorectal: completed 12/11/2015  Additional Screenings:  Hepatitis C Screening: 11/05/2014      Plan:    Patient will  continue to walk everyday for 30 minutes.   I have personally reviewed and noted the following in the patient's chart:   . Medical and social history . Use of alcohol, tobacco or illicit drugs  . Current medications and supplements . Functional ability and status . Nutritional status . Physical activity . Advanced directives . List of other physicians . Hospitalizations, surgeries, and ER visits in previous 12 months . Vitals . Screenings to include cognitive, depression, and falls . Referrals and appointments  In addition, I have reviewed and discussed with patient certain preventive protocols, quality metrics, and best practice recommendations. A written personalized care plan for preventive services as well as general preventive health recommendations were provided to patient.     Andrez Grime, LPN  05/28/9824

## 2019-07-04 NOTE — Progress Notes (Signed)
PCP notes:  Health Maintenance: No gaps noted   Abnormal Screenings: none   Patient concerns: none   Nurse concerns: none   Next PCP appt.: 07/05/2019 @ 10 am

## 2019-07-04 NOTE — Patient Instructions (Signed)
[name] , Thank you for taking time to come for your Medicare Wellness Visit. I appreciate your ongoing commitment to your health goals. Please review the following plan we discussed and let me know if I can assist you in the future.   Screening recommendations/referrals: Colonoscopy: Up to date, completed 12/11/2015 Recommended yearly ophthalmology/optometry visit for glaucoma screening and checkup Recommended yearly dental visit for hygiene and checkup  Vaccinations: Influenza vaccine: Up to date, completed 10/25/2018 Pneumococcal vaccine: Completed series Tdap vaccine: Up to date, completed 11/01/2013 Shingles vaccine: discussed    Advanced directives: Please bring a copy of your POA (Power of [place]) and/or Living Will to your next appointment.   Conditions/risks identified: hypertension, hyperlipidemia  Next appointment: 07/05/2019 @ 10 am   Preventive Care 65 Years and Older, Male Preventive care refers to lifestyle choices and visits with your health care provider that can promote health and wellness. What does preventive care include?  A yearly physical exam. This is also called an annual well check.  Dental exams once or twice a year.  Routine eye exams. Ask your health care provider how often you should have your eyes checked.  Personal lifestyle choices, including:  Daily care of your teeth and gums.  Regular physical activity.  Eating a healthy diet.  Avoiding tobacco and drug use.  Limiting alcohol use.  Practicing safe sex.  Taking low doses of aspirin every day.  Taking vitamin and mineral supplements as recommended by your health care provider. What happens during an annual well check? The services and screenings done by your health care provider during your annual well check will depend on your age, overall health, lifestyle risk factors, and family history of disease. Counseling  Your health care provider may ask you questions about  your:  Alcohol use.  Tobacco use.  Drug use.  Emotional well-being.  Home and relationship well-being.  Sexual activity.  Eating habits.  History of falls.  Memory and ability to understand (cognition).  Work and work Statistician. Screening  You may have the following tests or measurements:  Height, weight, and BMI.  Blood pressure.  Lipid and cholesterol levels. These may be checked every 5 years, or more frequently if you are over 20 years old.  Skin check.  Lung cancer screening. You may have this screening every year starting at age 33 if you have a 30-pack-year history of smoking and currently smoke or have quit within the past 15 years.  Fecal occult blood test (FOBT) of the stool. You may have this test every year starting at age 14.  Flexible sigmoidoscopy or colonoscopy. You may have a sigmoidoscopy every 5 years or a colonoscopy every 10 years starting at age 24.  Prostate cancer screening. Recommendations will vary depending on your family history and other risks.  Hepatitis C blood test.  Hepatitis B blood test.  Sexually transmitted disease (STD) testing.  Diabetes screening. This is done by checking your blood sugar (glucose) after you have not eaten for a while (fasting). You may have this done every 1-3 years.  Abdominal aortic aneurysm (AAA) screening. You may need this if you are a current or former smoker.  Osteoporosis. You may be screened starting at age 73 if you are at high risk. Talk with your health care provider about your test results, treatment options, and if necessary, the need for more tests. Vaccines  Your health care provider may recommend certain vaccines, such as:  Influenza vaccine. This is recommended every year.  Tetanus, diphtheria, and acellular pertussis (Tdap, Td) vaccine. You may need a Td booster every 10 years.  Zoster vaccine. You may need this after age 3.  Pneumococcal 13-valent conjugate (PCV13) vaccine.  One dose is recommended after age 82.  Pneumococcal polysaccharide (PPSV23) vaccine. One dose is recommended after age 60. Talk to your health care provider about which screenings and vaccines you need and how often you need them. This information is not intended to replace advice given to you by your health care provider. Make sure you discuss any questions you have with your health care provider. Document Released: 02/07/2015 Document Revised: 10/01/2015 Document Reviewed: 11/12/2014 Elsevier Interactive Patient Education  2017 [place] Prevention in the Home Falls can cause injuries. They can happen to people of all ages. There are many things you can do to make your home safe and to help prevent falls. What can I do on the outside of my home?  Regularly fix the edges of walkways and driveways and fix any cracks.  Remove anything that might make you trip as you walk through a door, such as a raised step or threshold.  Trim any bushes or trees on the path to your home.  Use bright outdoor lighting.  Clear any walking paths of anything that might make someone trip, such as rocks or tools.  Regularly check to see if handrails are loose or broken. Make sure that both sides of any steps have handrails.  Any raised decks and porches should have guardrails on the edges.  Have any leaves, snow, or ice cleared regularly.  Use sand or salt on walking paths during winter.  Clean up any spills in your garage right away. This includes oil or grease spills. What can I do in the bathroom?  Use night lights.  Install grab bars by the toilet and in the tub and shower. Do not use towel bars as grab bars.  Use non-skid mats or decals in the tub or shower.  If you need to sit down in the shower, use a plastic, non-slip stool.  Keep the floor dry. Clean up any water that spills on the floor as soon as it happens.  Remove soap buildup in the tub or shower regularly.  Attach bath  mats securely with double-sided non-slip rug tape.  Do not have throw rugs and other things on the floor that can make you trip. What can I do in the bedroom?  Use night lights.  Make sure that you have a light by your bed that is easy to reach.  Do not use any sheets or blankets that are too big for your bed. They should not hang down onto the floor.  Have a firm chair that has side arms. You can use this for support while you get dressed.  Do not have throw rugs and other things on the floor that can make you trip. What can I do in the kitchen?  Clean up any spills right away.  Avoid walking on wet floors.  Keep items that you use a lot in easy-to-reach places.  If you need to reach something above you, use a strong step stool that has a grab bar.  Keep electrical cords out of the way.  Do not use floor polish or wax that makes floors slippery. If you must use wax, use non-skid floor wax.  Do not have throw rugs and other things on the floor that can make you trip. What can I do  with my stairs?  Do not leave any items on the stairs.  Make sure that there are handrails on both sides of the stairs and use them. Fix handrails that are broken or loose. Make sure that handrails are as long as the stairways.  Check any carpeting to make sure that it is firmly attached to the stairs. Fix any carpet that is loose or worn.  Avoid having throw rugs at the top or bottom of the stairs. If you do have throw rugs, attach them to the floor with carpet tape.  Make sure that you have a light switch at the top of the stairs and the bottom of the stairs. If you do not have them, ask someone to add them for you. What else can I do to help prevent falls?  Wear shoes that:  Do not have high heels.  Have rubber bottoms.  Are comfortable and fit you well.  Are closed at the toe. Do not wear sandals.  If you use a stepladder:  Make sure that it is fully opened. Do not climb a closed  stepladder.  Make sure that both sides of the stepladder are locked into place.  Ask someone to hold it for you, if possible.  Clearly mark and make sure that you can see:  Any grab bars or handrails.  First and last steps.  Where the edge of each step is.  Use tools that help you move around (mobility aids) if they are needed. These include:  Canes.  Walkers.  Scooters.  Crutches.  Turn on the lights when you go into a dark area. Replace any light bulbs as soon as they burn out.  Set up your furniture so you have a clear path. Avoid moving your furniture around.  If any of your floors are uneven, fix them.  If there are any pets around you, be aware of where they are.  Review your medicines with your doctor. Some medicines can make you feel dizzy. This can increase your chance of falling. Ask your doctor what other things that you can do to help prevent falls. This information is not intended to replace advice given to you by your health care provider. Make sure you discuss any questions you have with your health care provider. Document Released: 11/07/2008 Document Revised: 06/19/2015 Document Reviewed: 02/15/2014 Elsevier Interactive Patient Education  2017 [employer].

## 2019-07-05 ENCOUNTER — Other Ambulatory Visit: Payer: Self-pay

## 2019-07-05 ENCOUNTER — Encounter: Payer: Self-pay | Admitting: Family Medicine

## 2019-07-05 ENCOUNTER — Ambulatory Visit (INDEPENDENT_AMBULATORY_CARE_PROVIDER_SITE_OTHER): Payer: Medicare Other | Admitting: Family Medicine

## 2019-07-05 VITALS — BP 120/70 | HR 64 | Temp 97.9°F | Ht 69.5 in | Wt 274.5 lb

## 2019-07-05 DIAGNOSIS — N182 Chronic kidney disease, stage 2 (mild): Secondary | ICD-10-CM | POA: Diagnosis not present

## 2019-07-05 DIAGNOSIS — G40909 Epilepsy, unspecified, not intractable, without status epilepticus: Secondary | ICD-10-CM

## 2019-07-05 DIAGNOSIS — T162XXA Foreign body in left ear, initial encounter: Secondary | ICD-10-CM

## 2019-07-05 DIAGNOSIS — R5383 Other fatigue: Secondary | ICD-10-CM | POA: Diagnosis not present

## 2019-07-05 DIAGNOSIS — Z0000 Encounter for general adult medical examination without abnormal findings: Secondary | ICD-10-CM

## 2019-07-05 DIAGNOSIS — I10 Essential (primary) hypertension: Secondary | ICD-10-CM

## 2019-07-05 DIAGNOSIS — E785 Hyperlipidemia, unspecified: Secondary | ICD-10-CM

## 2019-07-05 LAB — VITAMIN D 25 HYDROXY (VIT D DEFICIENCY, FRACTURES): VITD: 47.28 ng/mL (ref 30.00–100.00)

## 2019-07-05 LAB — VITAMIN B12: Vitamin B-12: 375 pg/mL (ref 211–911)

## 2019-07-05 NOTE — Assessment & Plan Note (Signed)
Improved

## 2019-07-05 NOTE — Progress Notes (Signed)
Chief Complaint  Patient presents with  . Annual Exam    Part 2    History of Present Illness: HPI  The patient presents for complete physical and review of chronic health problems. He/She also has the following acute concerns today:   The patient saw a LPN or RN for medicare wellness visit.  Prevention and wellness was reviewed in detail. Note reviewed and important notes copied below. Health Maintenance: No gaps noted Abnormal Screenings: none  07/05/19  CAD followed by cardiology Dr. Morton Stall and  Dr. Alethia Berthold. Last OV from 5/19 and 06/19/2019 reviewed.  Most recent eval good.  Since 12/2019  Having [place] he has been feeling tired, mild SOB.  5/6/ 2021 12.4 Hg.  TSH nml ' ferritin 162  CXR  Stable.  Hypertension:   At goal on current regimen.   amlodipine, eplerenone, lisinopril        BP Readings from Last 3 Encounters:  07/05/19 120/70  05/31/19 (!) 122/51  01/12/19 (!) 158/84  Using medication without problems or lightheadedness:  none Chest pain with exertion: none Edema:stable Short of breath: yes Average home BPs: Other issues:   Elevated Cholesterol: Statin intolerant - tried Pravachol, Lipitor and Crestor Now on repatha.  Lab Results  Component Value Date   CHOL 136 07/03/2019   HDL 40.70 07/03/2019   LDLCALC 78 07/03/2019   TRIG 85.0 07/03/2019   CHOLHDL 3 07/03/2019    Using medications without problems: Muscle aches:  Diet compliance: moderate Exercise: walking Other complaints:   Seizure D/O:  No seizures or SE on current depakote dose.   CKD Estimated Creatinine Clearance: 71.3 mL/min (by C-G formula based on SCr of 1.23 mg/dL).  This visit occurred during the SARS-CoV-2 public health emergency.  Safety protocols were in place, including screening questions prior to the visit, additional usage of staff PPE, and extensive cleaning of exam room while observing appropriate contact time as indicated for disinfecting solutions.   COVID 19  screen:  No recent travel or known exposure to COVID19 The patient denies respiratory symptoms of COVID 19 at this time. The importance of social distancing was discussed today.     Review of Systems  Constitutional: Positive for malaise/fatigue. Negative for chills and fever.  HENT: Negative for congestion and ear pain.   Eyes: Negative for pain and redness.  Respiratory: Negative for cough and shortness of breath.   Cardiovascular: Negative for chest pain, palpitations and leg swelling.  Gastrointestinal: Negative for abdominal pain, blood in stool, constipation, diarrhea, nausea and vomiting.  Genitourinary: Negative for dysuria.  Musculoskeletal: Negative for falls and myalgias.  Skin: Negative for rash.  Neurological: Negative for dizziness.  Psychiatric/Behavioral: Negative for depression. The patient is not nervous/anxious.       Past Medical History:  Diagnosis Date  . Arthritis   . CAD S/P percutaneous coronary angioplasty 1999   PCI- prox LAD @ D1, SP1 trifurcation.  3.0 mm x 16 mm AVE-GFX BMS  . History of nuclear stress test August 2012   Treadmill Myoview: 9 minutes, 10 METS --> hypertensive response.  No evidence of ischemia or infarction.  EF 64%  . HLD (hyperlipidemia)   . Hypertension   . Obesity (BMI 30-39.9)   . Seizure disorder ([place])   . Seizures ([place])    hx of years ago     reports that he has never smoked. He has never used smokeless tobacco. He reports that he does not drink alcohol and does not use drugs.  Current Outpatient Medications:  .  amLODipine (NORVASC) 5 MG tablet, Take 2 tablets (10 mg total) by mouth daily., Disp: 30 tablet, Rfl: 10 .  DEPAKOTE 250 MG DR tablet, TAKE 1 TABLET BY MOUTH TWICE A DAY, Disp: 180 tablet, Rfl: 3 .  eplerenone (INSPRA) 25 MG tablet, Take 25 mg by mouth daily., Disp: , Rfl:  .  Evolocumab with Infusor 420 MG/3.5ML SOCT, Inject 3.5 mLs into the skin every 30 (thirty) days. , Disp: , Rfl:  .  furosemide (LASIX) 20 MG  tablet, Take 1 tablet (20 mg total) by mouth daily., Disp: 5 tablet, Rfl: 0 .  Garlic 195 MG TABS, Take 500 mg by mouth daily., Disp: , Rfl:  .  lisinopril (ZESTRIL) 20 MG tablet, Take 10 mg by mouth daily., Disp: , Rfl:  .  Multiple Vitamin (MULTIVITAMIN) capsule, Take 1 capsule by mouth daily. , Disp: , Rfl:  .  NITROSTAT 0.4 MG SL tablet, DISSOLVE 1 TABLET UNDER THE TONGUE FOR CHEST PAIN. MAY REPEAT EVERY 5MINUTES UP TO 3 DOSES. IF NO RELIEF, CALL 911** (Patient taking differently: Place 0.4 mg under the tongue every 5 (five) minutes as needed for chest pain. ), Disp: 25 tablet, Rfl: 2   Observations/Objective: Blood pressure 120/70, pulse 64, temperature 97.9 F (36.6 C), temperature source Temporal, height 5' 9.5" (1.765 m), weight 274 lb 8 oz (124.5 kg), SpO2 98 %.  Physical Exam Constitutional:      Appearance: He is well-developed.  HENT:     Head: Normocephalic.     Comments:  Removed tip of Q-tip from left ear without complications.    Right Ear: Hearing normal.     Left Ear: Hearing normal.     Nose: Nose normal.  Neck:     Thyroid: No thyroid mass or thyromegaly.     Vascular: No carotid bruit.     Trachea: Trachea normal.  Cardiovascular:     Rate and Rhythm: Normal rate and regular rhythm.     Pulses: Normal pulses.     Heart sounds: Heart sounds not distant. No murmur heard.  No friction rub. No gallop.      Comments: No peripheral edema Pulmonary:     Effort: Pulmonary effort is normal. No respiratory distress.     Breath sounds: Normal breath sounds.  Skin:    General: Skin is warm and dry.     Findings: No rash.  Psychiatric:        Speech: Speech normal.        Behavior: Behavior normal.        Thought Content: Thought content normal.      Assessment and Plan    The patient's preventative maintenance and recommended screening tests for an annual wellness exam were reviewed in full today. Brought up to date unless services declined.  Counselled on the  importance of diet, exercise, and its role in overall health and mortality. The patient's FH and SH was reviewed, including their home life, tobacco status, and drug and alcohol status.   Stable PSA in past.. not indicated after age 27  Recent Labs  Colon:11/2015 tubular adenoma, repeat 5 years Vaccines:up to date prevnar, pneumovax and Tdap, consider shingles.  Considering COVID vaccine Nonsmoker. HEP C: neg Essential hypertension At goal on current regimen.  Seizure disorder ([place]) No seizures or SE on current depakote dose.  Hyperlipidemia with target LDL less than 70  tried Pravachol, Lipitor and Crestor Now on repatha.   Class 2 severe obesity  with serious comorbidity and body mass index (BMI) of 39.0 to 39.9 in adult [facility]) Wt Readings from Last 3 Encounters:  07/05/19 274 lb 8 oz (124.5 kg)  05/31/19 275 lb (124.7 kg)  01/12/19 268 lb (121.6 kg)    Body mass index is 39.96 kg/m.   Encouraged exercise, weight loss, healthy eating habits.    CKD stage G2/A2, GFR 60-89 and albumin creatinine ratio 30-299 mg/g Improved.   Procedure:  Patient gave verbal consent for  Procedure.  Qtip cotton in left ear canal. Simple removal with alligator forceps.  No bleeding, TM clear. No cerumen. No complications.   Eliezer Lofts, MD

## 2019-07-05 NOTE — Assessment & Plan Note (Addendum)
Wt Readings from Last 3 Encounters:  07/05/19 274 lb 8 oz (124.5 kg)  05/31/19 275 lb (124.7 kg)  01/12/19 268 lb (121.6 kg)    Body mass index is 39.96 kg/m.   Encouraged exercise, weight loss, healthy eating habits.

## 2019-07-05 NOTE — Assessment & Plan Note (Signed)
tried Pravachol, Lipitor and Crestor Now on repatha.

## 2019-07-05 NOTE — Assessment & Plan Note (Signed)
No seizures or SE on current depakote dose.

## 2019-07-05 NOTE — Assessment & Plan Note (Signed)
At goal on current regimen.

## 2019-07-05 NOTE — Patient Instructions (Addendum)
Please stop at the lab to have labs drawn.  Work on increasing exercise as tolerated and work on healthy eating to increase energy.

## 2019-07-18 DIAGNOSIS — G4733 Obstructive sleep apnea (adult) (pediatric): Secondary | ICD-10-CM | POA: Diagnosis not present

## 2019-07-18 DIAGNOSIS — G4739 Other sleep apnea: Secondary | ICD-10-CM | POA: Diagnosis not present

## 2019-07-18 DIAGNOSIS — I2510 Atherosclerotic heart disease of native coronary artery without angina pectoris: Secondary | ICD-10-CM | POA: Diagnosis not present

## 2019-09-17 ENCOUNTER — Telehealth: Payer: Self-pay | Admitting: Family Medicine

## 2019-09-17 NOTE — Telephone Encounter (Signed)
Last office visit 07/05/2019 for CPE.  Last refilled 08/24/2018 for #180 with 3 refills.  Last Depakote level 07/25/2018 which was love at 35.8 mg/L.  CPE scheduled for 07/08/2020.

## 2019-09-25 NOTE — Telephone Encounter (Signed)
I did receive a PA request from pharmacy.  Patient only wants Brand Name and his insurance will not pay for Brand name.  Patient was notified of this last year and elected to use a coupon and pay out of pocket for Brand Name.  Pharmacy was notified of this today.

## 2019-09-25 NOTE — Telephone Encounter (Addendum)
Patient called in to report that his pharmacy stated there is a problem with his insurance covering his r/x for Depakote. When I contacted him back, he had just heard that the r/x was ready to be picked up so he guessed everything "worked out".   I told him that I do not see anything on our end that indicates a problem and r/x was sent in for 1 year on 09/17/19.  He is going to go ahead to the pharmacy to pick up.  If he has any issues he will call us back.   FYI to Butch Penny in case she may be working on a prior auth that I cannot see in [place].

## 2019-09-26 NOTE — Telephone Encounter (Signed)
Spoke with Joshua Stark and reminded him that back in February his insurance would not cover Brand Name and patient states he can't take generic nor did he wish to stop taking the Depakote as suggested by Dr. Diona Browner.   In February, I printed him out a coupon to take to his pharmacy and advised I am happy to do that again.  Coupon printed and left up front for patient to pick up.

## 2019-09-26 NOTE — Telephone Encounter (Signed)
Pt left v/m that he has an additional question about Depakote. I spoke with pt; pt got text from pharmacy that depakote was only for 4 tabs until got PA from our office. Pt said he cannot take generic. I read the note from [place] from 09/25/19 at 10:50 AM to the pt. Pt request note sent back to [facility]; pt said this is the first time this has happened and pt is out of med and pt request cb today.

## 2019-11-21 DIAGNOSIS — I10 Essential (primary) hypertension: Secondary | ICD-10-CM | POA: Diagnosis not present

## 2019-11-21 DIAGNOSIS — I2510 Atherosclerotic heart disease of native coronary artery without angina pectoris: Secondary | ICD-10-CM | POA: Diagnosis not present

## 2019-11-21 DIAGNOSIS — E782 Mixed hyperlipidemia: Secondary | ICD-10-CM | POA: Diagnosis not present

## 2019-11-21 DIAGNOSIS — Z789 Other specified health status: Secondary | ICD-10-CM | POA: Diagnosis not present

## 2019-11-21 DIAGNOSIS — E785 Hyperlipidemia, unspecified: Secondary | ICD-10-CM | POA: Diagnosis not present

## 2019-11-21 DIAGNOSIS — N182 Chronic kidney disease, stage 2 (mild): Secondary | ICD-10-CM | POA: Diagnosis not present

## 2020-01-11 DIAGNOSIS — Z789 Other specified health status: Secondary | ICD-10-CM | POA: Diagnosis not present

## 2020-01-11 DIAGNOSIS — E782 Mixed hyperlipidemia: Secondary | ICD-10-CM | POA: Diagnosis not present

## 2020-01-11 DIAGNOSIS — I2510 Atherosclerotic heart disease of native coronary artery without angina pectoris: Secondary | ICD-10-CM | POA: Diagnosis not present

## 2020-01-17 DIAGNOSIS — M25572 Pain in left ankle and joints of left foot: Secondary | ICD-10-CM | POA: Diagnosis not present

## 2020-01-23 DIAGNOSIS — S92352A Displaced fracture of fifth metatarsal bone, left foot, initial encounter for closed fracture: Secondary | ICD-10-CM | POA: Diagnosis not present

## 2020-02-22 DIAGNOSIS — S92352D Displaced fracture of fifth metatarsal bone, left foot, subsequent encounter for fracture with routine healing: Secondary | ICD-10-CM | POA: Diagnosis not present

## 2020-03-26 DIAGNOSIS — S92352D Displaced fracture of fifth metatarsal bone, left foot, subsequent encounter for fracture with routine healing: Secondary | ICD-10-CM | POA: Diagnosis not present

## 2020-07-04 ENCOUNTER — Telehealth: Payer: Self-pay

## 2020-07-04 ENCOUNTER — Ambulatory Visit: Payer: Medicare Other

## 2020-07-04 NOTE — Telephone Encounter (Signed)
Please see previous phone message in chart.  Called patient to reschedule his appointment. Patient answered the phone and apologized for missing my calls earlier this afternoon. I notified patient that his voicemail was full and he stated he would take care of that. Asked patient if he wanted to reschedule appointment and he agreed. Rescheduled for 07/17/2020 @ 2:45 pm. Told patient to not go to the office, I will call him. Patient verbalized understanding and thanked me for taking the time to call him back.

## 2020-07-04 NOTE — Telephone Encounter (Signed)
[name] called in and stated that he did not receive any phone calls and wanted to know about getting a call

## 2020-07-04 NOTE — Progress Notes (Deleted)
Subjective:   Joshua Stark is a 75 y.o. male who presents for Medicare Annual/Subsequent preventive examination.  Review of Systems: N/A      I connected with the patient today by telephone and verified that I am speaking with the correct person using two identifiers. Location patient: home Location nurse: work Persons participating in the telephone visit: patient, nurse.   I discussed the limitations, risks, security and privacy concerns of performing an evaluation and management service by telephone and the availability of in person appointments. I also discussed with the patient that there may be a patient responsible charge related to this service. The patient expressed understanding and verbally consented to this telephonic visit.              Objective:    There were no vitals filed for this visit. There is no height or weight on file to calculate BMI.  Advanced Directives 07/04/2019 08/23/2018 08/23/2018 08/22/2018 06/28/2018 11/03/2016 08/17/2016  Does Patient Have a Medical Advance Directive? Yes Yes Yes No Yes Yes Yes  Type of Paramedic of [place];Living will Living will Living will - [place];Living will [place];Living will [place];Living will  Does patient want to make changes to medical advance directive? - No - Patient declined No - Patient declined - No - Patient declined - No - Patient declined  Copy of [place] in Chart? No - copy requested - - - No - copy requested No - copy requested No - copy requested  Would patient like information on creating a medical advance directive? - No - Patient declined No - Patient declined Yes (ED - Information included in AVS) - - -    Current Medications (verified) Outpatient Encounter Medications as of 07/04/2020  Medication Sig   amLODipine (NORVASC) 5 MG tablet Take 2 tablets (10 mg total) by mouth daily.   DEPAKOTE 250 MG  DR tablet TAKE 1 TABLET BY MOUTH TWICE A DAY   eplerenone (INSPRA) 25 MG tablet Take 25 mg by mouth daily.   Evolocumab with Infusor 420 MG/3.5ML SOCT Inject 3.5 mLs into the skin every 30 (thirty) days.    furosemide (LASIX) 20 MG tablet Take 1 tablet (20 mg total) by mouth daily.   Garlic 409 MG TABS Take 500 mg by mouth daily.   lisinopril (ZESTRIL) 20 MG tablet Take 10 mg by mouth daily.   Multiple Vitamin (MULTIVITAMIN) capsule Take 1 capsule by mouth daily.    NITROSTAT 0.4 MG SL tablet DISSOLVE 1 TABLET UNDER THE TONGUE FOR CHEST PAIN. MAY REPEAT EVERY 5MINUTES UP TO 3 DOSES. IF NO RELIEF, CALL 911** (Patient taking differently: Place 0.4 mg under the tongue every 5 (five) minutes as needed for chest pain. )   No facility-administered encounter medications on file as of 07/04/2020.    Allergies (verified) Fenofibrate, Rosuvastatin, Pravastatin sodium, and Statins   History: Past Medical History:  Diagnosis Date   Arthritis    CAD S/P percutaneous coronary angioplasty 1999   PCI- prox LAD @ D1, SP1 trifurcation.  3.0 mm x 16 mm AVE-GFX BMS   History of nuclear stress test August 2012   Treadmill Myoview: 9 minutes, 10 METS --> hypertensive response.  No evidence of ischemia or infarction.  EF 64%   HLD (hyperlipidemia)    Hypertension    Obesity (BMI 30-39.9)    Seizure disorder ([facility])    Seizures ([facility])    hx of  years ago    Past Surgical History:  Procedure Laterality Date   CARDIAC CATHETERIZATION  01/20/1998   CORONARY ANGIOPLASTY  01/20/1998   PCI to LAD ,prox LAD at trifurcation for D1and SP1.3.0-x16-mm AVE-GFX bare-metal stent   FEMUR IM NAIL Left 08/23/2018   Procedure: INTRAMEDULLARY (IM) RETROGRADE FEMORAL NAILING;  Surgeon: Paralee Cancel, MD;  Location: [facility];  Service: Orthopedics;  Laterality: Left;   NM MYOCAR PERF WALL MOTION  Aug 2 ,2012   treadmill myoview exercised 9 minutes, reacing 10 metaboblic equivlents,had ahypertensive reponse .EF64%   TOTAL KNEE  ARTHROPLASTY Left 08/17/2016   Procedure: LEFT TOTAL KNEE ARTHROPLASTY;  Surgeon: Paralee Cancel, MD;  Location: [facility];  Service: Orthopedics;  Laterality: Left;   TRANSTHORACIC ECHOCARDIOGRAM  09/20/2013   normal LV size and function. EF 60-65%. Normal diastolic function. Mild LA dilation and mild AI.  No evidence of aortic stenosis. No significant MR.   Family History  Problem Relation Age of Onset   Hypertension Mother    Cancer Father        lung and prostate   Social History   Socioeconomic History   Marital status: Married    Spouse name: Not on file   Number of children: 2   Years of education: Not on file   Highest education level: Not on file  Occupational History   Not on file  Tobacco Use   Smoking status: Never   Smokeless tobacco: Never  Vaping Use   Vaping Use: Never used  Substance and Sexual Activity   Alcohol use: No    Alcohol/week: 0.0 standard drinks   Drug use: No   Sexual activity: Not on file  Other Topics Concern   Not on file  Social History Narrative   He is a married father of 2, grandfather 20.   He usually walks 5 days a week.   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  He does a lot of activity and strenuous labor with this as well.   He never smoked, and does not drink alcohol.   Social Determinants of Health   Financial Resource Strain: Not on file  Food Insecurity: Not on file  Transportation Needs: Not on file  Physical Activity: Not on file  Stress: Not on file  Social Connections: Not on file    Tobacco Counseling Counseling given: Not Answered   Clinical Intake:                 Diabetic: No Nutrition Risk Assessment:  Has the patient had any N/V/D within the last 2 months?  {YES/NO:21197} Does the patient have any non-healing wounds?  {YES/NO:21197} Has the patient had any unintentional weight loss or weight gain?  {YES/NO:21197}  Diabetes:  Is the patient diabetic?  No  If  diabetic, was a CBG obtained today?   N/A Did the patient bring in their glucometer from home?   N/A How often do you monitor your CBG's? N/A.   Financial Strains and Diabetes Management:  Are you having any financial strains with the device, your supplies or your medication?  N/A .  Does the patient want to be seen by Chronic Care Management for management of their diabetes?   N/A Would the patient like to be referred to a Nutritionist or for Diabetic Management?   N/A        Activities of Daily Living No flowsheet data found.  Patient Care Team: Jinny Sanders, MD as PCP -  General  Indicate any recent Medical Services you may have received from other than [facility] providers in the past year (date may be approximate).     Assessment:   This is a routine wellness examination for [place].  Hearing/Vision screen No results found.  Dietary issues and exercise activities discussed:     Goals Addressed   None    Depression Screen PHQ 2/9 Scores 07/04/2019 06/28/2018 11/03/2016 05/03/2014 11/01/2013  PHQ - 2 Score 0 0 0 0 0  PHQ- 9 Score 0 0 0 - -    Fall Risk Fall Risk  07/04/2019 06/28/2018 11/03/2016 11/01/2013  Falls in the past year? 1 0 No No  Comment tripped in shop while working - - -  Number falls in past yr: 0 - - -  Injury with Fall? 1 - - -  Comment broke leg - - -  Risk for fall due to : Medication side effect - - -  Follow up Falls evaluation completed;Falls prevention discussed - - -    FALL RISK PREVENTION PERTAINING TO THE HOME:  Any stairs in or around the home? Yes  If so, are there any without handrails? No  Home free of loose throw rugs in walkways, pet beds, electrical cords, etc? Yes  Adequate lighting in your home to reduce risk of falls? Yes   ASSISTIVE DEVICES UTILIZED TO PREVENT FALLS:  Life alert? {YES/NO:21197} Use of a cane, walker or w/c? {YES/NO:21197} Grab bars in the bathroom? {YES/NO:21197} Shower chair or bench in shower?  {YES/NO:21197} Elevated toilet seat or a handicapped toilet? {YES/NO:21197}  TIMED UP AND GO:  Was the test performed?  N/A telephone visit  .   Cognitive Function: MMSE - Mini Mental State Exam 07/04/2019 06/28/2018 11/03/2016  Orientation to time 5 5 5   Orientation to Place 5 5 5   Registration 3 3 3   Attention/ Calculation 5 0 0  Recall 3 3 3   Language- name 2 objects - 0 0  Language- repeat 1 1 1   Language- follow 3 step command - 0 3  Language- read & follow direction - 0 0  Write a sentence - 0 0  Copy design - 0 0  Total score - 17 20  Mini Cog  Mini-Cog screen was completed. Maximum score is 22. A value of 0 denotes this part of the MMSE was not completed or the patient failed this part of the Mini-Cog screening.       Immunizations Immunization History  Administered Date(s) Administered   Influenza Split 11/26/2010, 11/12/2011   Influenza Whole 10/25/2008, 10/21/2009   Influenza, High Dose Seasonal PF 10/25/2018   Influenza,inj,Quad PF,6+ Mos 10/31/2012, 11/01/2013, 11/05/2014, 11/11/2015, 11/23/2016   Influenza-Unspecified 12/14/2017   Pneumococcal Conjugate-13 11/01/2013   Pneumococcal Polysaccharide-23 11/05/2014   Td 08/06/2003   Tdap 11/01/2013    TDAP status: Up to date  Flu Vaccine status: due Fall 2022  Pneumococcal vaccine status: Up to date  {Covid-19 vaccine status:2101808}  Qualifies for Shingles Vaccine? Yes   Zostavax completed No   Shingrix Completed?: No.    Education has been provided regarding the importance of this vaccine. Patient has been advised to call insurance company to determine out of pocket expense if they have not yet received this vaccine. Advised may also receive vaccine at local pharmacy or Health Dept. Verbalized acceptance and understanding.  Screening Tests Health Maintenance  Topic Date Due   COVID-19 Vaccine (1) Never done   Zoster Vaccines- Shingrix (1 of 2) Never done  INFLUENZA VACCINE  08/25/2020   COLONOSCOPY  (Pts 45-76yrs Insurance coverage will need to be confirmed)  12/10/2020   TETANUS/TDAP  11/02/2023   Hepatitis C Screening  Completed   PNA vac Low Risk Adult  Completed   HPV VACCINES  Aged Out    Health Maintenance  Health Maintenance Due  Topic Date Due   COVID-19 Vaccine (1) Never done   Zoster Vaccines- Shingrix (1 of 2) Never done    Colorectal cancer screening: Type of screening: Colonoscopy. Completed 12/11/2015. Repeat every 5 years  Lung Cancer Screening: (Low Dose CT Chest recommended if Age 6-80 years, 30 pack-year currently smoking OR have quit w/in 15years.) does not qualify.  Additional Screening:  Hepatitis C Screening: does qualify; Completed 11/05/2014  Vision Screening: Recommended annual ophthalmology exams for early detection of glaucoma and other disorders of the eye. Is the patient up to date with their annual eye exam?  {YES/NO:21197} Who is the provider or what is the name of the office in which the patient attends annual eye exams? *** If pt is not established with a provider, would they like to be referred to a provider to establish care? No .   Dental Screening: Recommended annual dental exams for proper oral hygiene  Community Resource Referral / Chronic Care Management: CRR required this visit?  No   CCM required this visit?  No      Plan:     I have personally reviewed and noted the following in the patient's chart:   Medical and social history Use of alcohol, tobacco or illicit drugs  Current medications and supplements including opioid prescriptions. {Opioid Prescriptions:639 406 [id number]} Functional ability and status Nutritional status Physical activity Advanced directives List of other physicians Hospitalizations, surgeries, and ER visits in previous 12 months Vitals Screenings to include cognitive, depression, and falls Referrals and appointments  In addition, I have reviewed and discussed with patient certain preventive  protocols, quality metrics, and best practice recommendations. A written personalized care plan for preventive services as well as general preventive health recommendations were provided to patient.   Due to this being a telephonic visit, the after visit summary with patients personalized plan was offered to patient via office or my-chart. Patient preferred to pick up at office at next visit or via mychart.   Andrez Grime, LPN   [id number]

## 2020-07-04 NOTE — Telephone Encounter (Signed)
Called patient 3 times trying to complete his AWV. Patient never answered. Unable to leave message cause patient's voicemail was full.

## 2020-07-08 ENCOUNTER — Encounter: Payer: Medicare Other | Admitting: Family Medicine

## 2020-07-17 ENCOUNTER — Ambulatory Visit (INDEPENDENT_AMBULATORY_CARE_PROVIDER_SITE_OTHER): Payer: Medicare Other

## 2020-07-17 ENCOUNTER — Other Ambulatory Visit: Payer: Self-pay

## 2020-07-17 DIAGNOSIS — Z0000 Encounter for general adult medical examination without abnormal findings: Secondary | ICD-10-CM

## 2020-07-17 NOTE — Patient Instructions (Signed)
[name] , Thank you for taking time to come for your Medicare Wellness Visit. I appreciate your ongoing commitment to your health goals. Please review the following plan we discussed and let me know if I can assist you in the future.   Screening recommendations/referrals: Colonoscopy: Up to date, completed 12/11/2015, due 11/2020 Recommended yearly ophthalmology/optometry visit for glaucoma screening and checkup Recommended yearly dental visit for hygiene and checkup  Vaccinations: Influenza vaccine: due Fall 2022 Pneumococcal vaccine: Completed series Tdap vaccine: Up to date, completed 11/01/2013, due 10/2023 Shingles vaccine: due, check with your insurance regarding coverage if interested    Covid-19: declined  Advanced directives: Please bring a copy of your POA (Power of Joshua Stark) and/or Living Will to your next appointment.   Conditions/risks identified: hypertension   Next appointment: Follow up in one year for your annual wellness visit.   Preventive Care 74 Years and Older, Male Preventive care refers to lifestyle choices and visits with your health care provider that can promote health and wellness. What does preventive care include? A yearly physical exam. This is also called an annual well check. Dental exams once or twice a year. Routine eye exams. Ask your health care provider how often you should have your eyes checked. Personal lifestyle choices, including: Daily care of your teeth and gums. Regular physical activity. Eating a healthy diet. Avoiding tobacco and drug use. Limiting alcohol use. Practicing safe sex. Taking low doses of aspirin every day. Taking vitamin and mineral supplements as recommended by your health care provider. What happens during an annual well check? The services and screenings done by your health care provider during your annual well check will depend on your age, overall health, lifestyle risk factors, and family history of  disease. Counseling  Your health care provider may ask you questions about your: Alcohol use. Tobacco use. Drug use. Emotional well-being. Home and relationship well-being. Sexual activity. Eating habits. History of falls. Memory and ability to understand (cognition). Work and work Statistician. Screening  You may have the following tests or measurements: Height, weight, and BMI. Blood pressure. Lipid and cholesterol levels. These may be checked every 5 years, or more frequently if you are over 30 years old. Skin check. Lung cancer screening. You may have this screening every year starting at age 19 if you have a 30-pack-year history of smoking and currently smoke or have quit within the past 15 years. Fecal occult blood test (FOBT) of the stool. You may have this test every year starting at age 43. Flexible sigmoidoscopy or colonoscopy. You may have a sigmoidoscopy every 5 years or a colonoscopy every 10 years starting at age 85. Prostate cancer screening. Recommendations will vary depending on your family history and other risks. Hepatitis C blood test. Hepatitis B blood test. Sexually transmitted disease (STD) testing. Diabetes screening. This is done by checking your blood sugar (glucose) after you have not eaten for a while (fasting). You may have this done every 1-3 years. Abdominal aortic aneurysm (AAA) screening. You may need this if you are a current or former smoker. Osteoporosis. You may be screened starting at age 54 if you are at high risk. Talk with your health care provider about your test results, treatment options, and if necessary, the need for more tests. Vaccines  Your health care provider may recommend certain vaccines, such as: Influenza vaccine. This is recommended every year. Tetanus, diphtheria, and acellular pertussis (Tdap, Td) vaccine. You may need a Td booster every 10 years. Zoster vaccine.  You may need this after age 21. Pneumococcal 13-valent  conjugate (PCV13) vaccine. One dose is recommended after age 8. Pneumococcal polysaccharide (PPSV23) vaccine. One dose is recommended after age 45. Talk to your health care provider about which screenings and vaccines you need and how often you need them. This information is not intended to replace advice given to you by your health care provider. Make sure you discuss any questions you have with your health care provider. Document Released: 02/07/2015 Document Revised: 10/01/2015 Document Reviewed: 11/12/2014 Elsevier Interactive Patient Education  2017 [employer] Prevention in the Home Falls can cause injuries. They can happen to people of all ages. There are many things you can do to make your home safe and to help prevent falls. What can I do on the outside of my home? Regularly fix the edges of walkways and driveways and fix any cracks. Remove anything that might make you trip as you walk through a door, such as a raised step or threshold. Trim any bushes or trees on the path to your home. Use bright outdoor lighting. Clear any walking paths of anything that might make someone trip, such as rocks or tools. Regularly check to see if handrails are loose or broken. Make sure that both sides of any steps have handrails. Any raised decks and porches should have guardrails on the edges. Have any leaves, snow, or ice cleared regularly. Use sand or salt on walking paths during winter. Clean up any spills in your garage right away. This includes oil or grease spills. What can I do in the bathroom? Use night lights. Install grab bars by the toilet and in the tub and shower. Do not use towel bars as grab bars. Use non-skid mats or decals in the tub or shower. If you need to sit down in the shower, use a plastic, non-slip stool. Keep the floor dry. Clean up any water that spills on the floor as soon as it happens. Remove soap buildup in the tub or shower regularly. Attach bath mats  securely with double-sided non-slip rug tape. Do not have throw rugs and other things on the floor that can make you trip. What can I do in the bedroom? Use night lights. Make sure that you have a light by your bed that is easy to reach. Do not use any sheets or blankets that are too big for your bed. They should not hang down onto the floor. Have a firm chair that has side arms. You can use this for support while you get dressed. Do not have throw rugs and other things on the floor that can make you trip. What can I do in the kitchen? Clean up any spills right away. Avoid walking on wet floors. Keep items that you use a lot in easy-to-reach places. If you need to reach something above you, use a strong step stool that has a grab bar. Keep electrical cords out of the way. Do not use floor polish or wax that makes floors slippery. If you must use wax, use non-skid floor wax. Do not have throw rugs and other things on the floor that can make you trip. What can I do with my stairs? Do not leave any items on the stairs. Make sure that there are handrails on both sides of the stairs and use them. Fix handrails that are broken or loose. Make sure that handrails are as long as the stairways. Check any carpeting to make sure that it is  firmly attached to the stairs. Fix any carpet that is loose or worn. Avoid having throw rugs at the top or bottom of the stairs. If you do have throw rugs, attach them to the floor with carpet tape. Make sure that you have a light switch at the top of the stairs and the bottom of the stairs. If you do not have them, ask someone to add them for you. What else can I do to help prevent falls? Wear shoes that: Do not have high heels. Have rubber bottoms. Are comfortable and fit you well. Are closed at the toe. Do not wear sandals. If you use a stepladder: Make sure that it is fully opened. Do not climb a closed stepladder. Make sure that both sides of the stepladder  are locked into place. Ask someone to hold it for you, if possible. Clearly mark and make sure that you can see: Any grab bars or handrails. First and last steps. Where the edge of each step is. Use tools that help you move around (mobility aids) if they are needed. These include: Canes. Walkers. Scooters. Crutches. Turn on the lights when you go into a dark area. Replace any light bulbs as soon as they burn out. Set up your furniture so you have a clear path. Avoid moving your furniture around. If any of your floors are uneven, fix them. If there are any pets around you, be aware of where they are. Review your medicines with your doctor. Some medicines can make you feel dizzy. This can increase your chance of falling. Ask your doctor what other things that you can do to help prevent falls. This information is not intended to replace advice given to you by your health care provider. Make sure you discuss any questions you have with your health care provider. Document Released: 11/07/2008 Document Revised: 06/19/2015 Document Reviewed: 02/15/2014 Elsevier Interactive Patient Education  2017 [employer].

## 2020-07-17 NOTE — Progress Notes (Signed)
PCP notes:  Health Maintenance: Shingrix- due Covid- declined   Abnormal Screenings: none   Patient concerns: none   Nurse concerns: none   Next PCP appt.: 07/31/2020 @ 8:40 am

## 2020-07-17 NOTE — Progress Notes (Signed)
Subjective:   Joshua Stark is a 75 y.o. male who presents for Medicare Annual/Subsequent preventive examination.  Review of Systems      I connected with the patient today by telephone and verified that I am speaking with the correct person using two identifiers. Location patient: home Location nurse: work Persons participating in the telephone visit: patient, nurse.   I discussed the limitations, risks, security and privacy concerns of performing an evaluation and management service by telephone and the availability of in person appointments. I also discussed with the patient that there may be a patient responsible charge related to this service. The patient expressed understanding and verbally consented to this telephonic visit.        Cardiac Risk Factors include: advanced age (>69men, >82 women);male gender;hypertension     Objective:    Today's Vitals   There is no height or weight on file to calculate BMI.  Advanced Directives 07/17/2020 07/04/2019 08/23/2018 08/23/2018 08/22/2018 06/28/2018 11/03/2016  Does Patient Have a Medical Advance Directive? Yes Yes Yes Yes No Yes Yes  Type of Paramedic of [place];Living will [place];Living will Living will Living will - [place];Living will [place];Living will  Does patient want to make changes to medical advance directive? - - No - Patient declined No - Patient declined - No - Patient declined -  Copy of [place] in Chart? No - copy requested No - copy requested - - - No - copy requested No - copy requested  Would patient like information on creating a medical advance directive? - - No - Patient declined No - Patient declined Yes (ED - Information included in AVS) - -    Current Medications (verified) Outpatient Encounter Medications as of 07/17/2020  Medication Sig   amLODipine (NORVASC) 5 MG tablet Take 2 tablets (10 mg total)  by mouth daily.   DEPAKOTE 250 MG DR tablet TAKE 1 TABLET BY MOUTH TWICE A DAY   eplerenone (INSPRA) 25 MG tablet Take 25 mg by mouth daily.   Evolocumab with Infusor 420 MG/3.5ML SOCT Inject 3.5 mLs into the skin every 30 (thirty) days.    furosemide (LASIX) 20 MG tablet Take 1 tablet (20 mg total) by mouth daily.   Garlic 177 MG TABS Take 500 mg by mouth daily.   lisinopril (ZESTRIL) 20 MG tablet Take 10 mg by mouth daily.   Multiple Vitamin (MULTIVITAMIN) capsule Take 1 capsule by mouth daily.    NITROSTAT 0.4 MG SL tablet DISSOLVE 1 TABLET UNDER THE TONGUE FOR CHEST PAIN. MAY REPEAT EVERY 5MINUTES UP TO 3 DOSES. IF NO RELIEF, CALL 911** (Patient taking differently: Place 0.4 mg under the tongue every 5 (five) minutes as needed for chest pain.)   No facility-administered encounter medications on file as of 07/17/2020.    Allergies (verified) Fenofibrate, Rosuvastatin, Pravastatin sodium, and Statins   History: Past Medical History:  Diagnosis Date   Arthritis    CAD S/P percutaneous coronary angioplasty 1999   PCI- prox LAD @ D1, SP1 trifurcation.  3.0 mm x 16 mm AVE-GFX BMS   History of nuclear stress test August 2012   Treadmill Myoview: 9 minutes, 10 METS --> hypertensive response.  No evidence of ischemia or infarction.  EF 64%   HLD (hyperlipidemia)    Hypertension    Obesity (BMI 30-39.9)    Seizure disorder (HCC)    Seizures ([place])    hx of years  ago    Past Surgical History:  Procedure Laterality Date   CARDIAC CATHETERIZATION  01/20/1998   CORONARY ANGIOPLASTY  01/20/1998   PCI to LAD ,prox LAD at trifurcation for D1and SP1.3.0-x16-mm AVE-GFX bare-metal stent   FEMUR IM NAIL Left 08/23/2018   Procedure: INTRAMEDULLARY (IM) RETROGRADE FEMORAL NAILING;  Surgeon: Paralee Cancel, MD;  Location: [facility];  Service: Orthopedics;  Laterality: Left;   NM MYOCAR PERF WALL MOTION  Aug 2 ,2012   treadmill myoview exercised 9 minutes, reacing 10 metaboblic equivlents,had  ahypertensive reponse .EF64%   TOTAL KNEE ARTHROPLASTY Left 08/17/2016   Procedure: LEFT TOTAL KNEE ARTHROPLASTY;  Surgeon: Paralee Cancel, MD;  Location: [facility];  Service: Orthopedics;  Laterality: Left;   TRANSTHORACIC ECHOCARDIOGRAM  09/20/2013   normal LV size and function. EF 60-65%. Normal diastolic function. Mild LA dilation and mild AI.  No evidence of aortic stenosis. No significant MR.   Family History  Problem Relation Age of Onset   Hypertension Mother    Cancer Father        lung and prostate   Social History   Socioeconomic History   Marital status: Married    Spouse name: Not on file   Number of children: 2   Years of education: Not on file   Highest education level: Not on file  Occupational History   Not on file  Tobacco Use   Smoking status: Never   Smokeless tobacco: Never  Vaping Use   Vaping Use: Never used  Substance and Sexual Activity   Alcohol use: No    Alcohol/week: 0.0 standard drinks   Drug use: No   Sexual activity: Not on file  Other Topics Concern   Not on file  Social History Narrative   He is a married father of 2, grandfather 85.   He usually walks 5 days a week.   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  He does a lot of activity and strenuous labor with this as well.   He never smoked, and does not drink alcohol.   Social Determinants of Health   Financial Resource Strain: Low Risk    Difficulty of Paying Living Expenses: Not hard at all  Food Insecurity: No Food Insecurity   Worried About Charity fundraiser in the Last Year: Never true   [place] in the Last Year: Never true  Transportation Needs: No Transportation Needs   Lack of Transportation (Medical): No   Lack of Transportation (Non-Medical): No  Physical Activity: Sufficiently Active   Days of Exercise per Week: 7 days   Minutes of Exercise per Session: 30 min  Stress: No Stress Concern Present   Feeling of Stress : Not at all   Social Connections: Not on file    Tobacco Counseling Counseling given: Not Answered   Clinical Intake:  Pre-visit preparation completed: Yes  Pain : No/denies pain     Nutritional Risks: None Diabetes: No  How often do you need to have someone help you when you read instructions, pamphlets, or other written materials from your doctor or pharmacy?: 1 - Never  Diabetic: No Nutrition Risk Assessment:  Has the patient had any N/V/D within the last 2 months?  No  Does the patient have any non-healing wounds?  No  Has the patient had any unintentional weight loss or weight gain?  No   Diabetes:  Is the patient diabetic?  No  If diabetic, was a CBG  obtained today?   N/A Did the patient bring in their glucometer from home?   N/A How often do you monitor your CBG's? N/A.   Financial Strains and Diabetes Management:  Are you having any financial strains with the device, your supplies or your medication?  N/A .  Does the patient want to be seen by Chronic Care Management for management of their diabetes?   N/A Would the patient like to be referred to a Nutritionist or for Diabetic Management?   N/A  Interpreter Needed?: No  Information entered by :: CJohnson, LPN   Activities of Daily Living In your present state of health, do you have any difficulty performing the following activities: 07/17/2020  Hearing? N  Vision? N  Difficulty concentrating or making decisions? N  Walking or climbing stairs? N  Dressing or bathing? N  Doing errands, shopping? N  Preparing Food and eating ? N  Using the Toilet? N  In the past six months, have you accidently leaked urine? N  Do you have problems with loss of bowel control? N  Managing your Medications? N  Managing your Finances? N  Housekeeping or managing your Housekeeping? N  Some recent data might be hidden    Patient Care Team: Jinny Sanders, MD as PCP - General  Indicate any recent Medical Services you may have  received from other than [facility] providers in the past year (date may be approximate).     Assessment:   This is a routine wellness examination for [place].  Hearing/Vision screen Vision Screening - Comments:: Patient gets annual eye exams   Dietary issues and exercise activities discussed: Current Exercise Habits: Home exercise routine, Type of exercise: walking, Time (Minutes): 30, Frequency (Times/Week): 7, Weekly Exercise (Minutes/Week): 210, Intensity: Moderate, Exercise limited by: None identified   Goals Addressed             This Visit's Progress    Patient Stated       07/17/2020, I will continue to walk daily for 30 minutes        Depression Screen PHQ 2/9 Scores 07/17/2020 07/04/2019 06/28/2018 11/03/2016 05/03/2014 11/01/2013  PHQ - 2 Score 0 0 0 0 0 0  PHQ- 9 Score 0 0 0 0 - -    Fall Risk Fall Risk  07/17/2020 07/04/2019 06/28/2018 11/03/2016 11/01/2013  Falls in the past year? 0 1 0 No No  Comment - tripped in shop while working - - -  Number falls in past yr: 0 0 - - -  Injury with Fall? 0 1 - - -  Comment - broke leg - - -  Risk for fall due to : Medication side effect Medication side effect - - -  Follow up Falls evaluation completed;Falls prevention discussed Falls evaluation completed;Falls prevention discussed - - -    FALL RISK PREVENTION PERTAINING TO THE HOME:  Any stairs in or around the home? Yes  If so, are there any without handrails? No  Home free of loose throw rugs in walkways, pet beds, electrical cords, etc? Yes  Adequate lighting in your home to reduce risk of falls? Yes   ASSISTIVE DEVICES UTILIZED TO PREVENT FALLS:  Life alert? No  Use of a cane, walker or w/c? No  Grab bars in the bathroom? No  Shower chair or bench in shower? No  Elevated toilet seat or a handicapped toilet? No   TIMED UP AND GO:  Was the test performed?  N/A telephone visit .  Cognitive Function: MMSE - Mini Mental State Exam 07/17/2020 07/04/2019 06/28/2018 11/03/2016   Not completed: Refused - - -  Orientation to time - 5 5 5   Orientation to Place - 5 5 5   Registration - 3 3 3   Attention/ Calculation - 5 0 0  Recall - 3 3 3   Language- name 2 objects - - 0 0  Language- repeat - 1 1 1   Language- follow 3 step command - - 0 3  Language- read & follow direction - - 0 0  Write a sentence - - 0 0  Copy design - - 0 0  Total score - - 17 20  Mini Cog  Mini-Cog screen was not completed. Patient refused. Maximum score is 22. A value of 0 denotes this part of the MMSE was not completed or the patient failed this part of the Mini-Cog screening.       Immunizations Immunization History  Administered Date(s) Administered   Influenza Split 11/26/2010, 11/12/2011   Influenza Whole 10/25/2008, 10/21/2009   Influenza, High Dose Seasonal PF 10/25/2018   Influenza,inj,Quad PF,6+ Mos 10/31/2012, 11/01/2013, 11/05/2014, 11/11/2015, 11/23/2016   Influenza-Unspecified 12/14/2017   Pneumococcal Conjugate-13 11/01/2013   Pneumococcal Polysaccharide-23 11/05/2014   Td 08/06/2003   Tdap 11/01/2013    TDAP status: Up to date  Flu Vaccine status: due Fall 2022  Pneumococcal vaccine status: Up to date  Covid-19 vaccine status: Declined, Education has been provided regarding the importance of this vaccine but patient still declined. Advised may receive this vaccine at local pharmacy or Health Dept.or vaccine clinic. Aware to provide a copy of the vaccination record if obtained from local pharmacy or Health Dept. Verbalized acceptance and understanding.  Qualifies for Shingles Vaccine? Yes   Zostavax completed No   Shingrix Completed?: No.    Education has been provided regarding the importance of this vaccine. Patient has been advised to call insurance company to determine out of pocket expense if they have not yet received this vaccine. Advised may also receive vaccine at local pharmacy or Health Dept. Verbalized acceptance and understanding.  Screening  Tests Health Maintenance  Topic Date Due   Zoster Vaccines- Shingrix (1 of 2) Never done   COVID-19 Vaccine (1) 08/02/2020 (Originally 12/09/1950)   INFLUENZA VACCINE  08/25/2020   COLONOSCOPY (Pts 45-65yrs Insurance coverage will need to be confirmed)  12/10/2020   TETANUS/TDAP  11/02/2023   Hepatitis C Screening  Completed   PNA vac Low Risk Adult  Completed   HPV VACCINES  Aged Out    Health Maintenance  Health Maintenance Due  Topic Date Due   Zoster Vaccines- Shingrix (1 of 2) Never done    Colorectal cancer screening: Type of screening: Colonoscopy. Completed 12/11/2015. Repeat every 5 years  Lung Cancer Screening: (Low Dose CT Chest recommended if Age 66-80 years, 30 pack-year currently smoking OR have quit w/in 15years.) does not qualify.  Additional Screening:  Hepatitis C Screening: does qualify; Completed 11/05/2014  Vision Screening: Recommended annual ophthalmology exams for early detection of glaucoma and other disorders of the eye. Is the patient up to date with their annual eye exam?  Yes  Who is the provider or what is the name of the office in which the patient attends annual eye exams? [place], [place] If pt is not established with a provider, would they like to be referred to a provider to establish care?  No .   Dental Screening: Recommended annual dental exams for proper oral hygiene  Community Resource Referral / Chronic Care Management: CRR required this visit?  No   CCM required this visit?  No      Plan:     I have personally reviewed and noted the following in the patient's chart:   Medical and social history Use of alcohol, tobacco or illicit drugs  Current medications and supplements including opioid prescriptions. Patient is not currently taking opioid prescriptions. Functional ability and status Nutritional status Physical activity Advanced directives List of other physicians Hospitalizations, surgeries, and ER visits  in previous 12 months Vitals Screenings to include cognitive, depression, and falls Referrals and appointments  In addition, I have reviewed and discussed with patient certain preventive protocols, quality metrics, and best practice recommendations. A written personalized care plan for preventive services as well as general preventive health recommendations were provided to patient.   Due to this being a telephonic visit, the after visit summary with patients personalized plan was offered to patient via office or my-chart. Patient preferred to pick up at office at next visit or via mychart.   Andrez Grime, LPN   04/20/7122

## 2020-07-31 ENCOUNTER — Other Ambulatory Visit: Payer: Self-pay

## 2020-07-31 ENCOUNTER — Encounter: Payer: Self-pay | Admitting: Family Medicine

## 2020-07-31 ENCOUNTER — Ambulatory Visit (INDEPENDENT_AMBULATORY_CARE_PROVIDER_SITE_OTHER): Payer: Medicare Other | Admitting: Family Medicine

## 2020-07-31 VITALS — BP 128/62 | HR 57 | Temp 98.1°F | Ht 70.0 in | Wt 274.0 lb

## 2020-07-31 DIAGNOSIS — G40909 Epilepsy, unspecified, not intractable, without status epilepticus: Secondary | ICD-10-CM | POA: Diagnosis not present

## 2020-07-31 DIAGNOSIS — I10 Essential (primary) hypertension: Secondary | ICD-10-CM | POA: Diagnosis not present

## 2020-07-31 DIAGNOSIS — Z789 Other specified health status: Secondary | ICD-10-CM

## 2020-07-31 DIAGNOSIS — Z6839 Body mass index (BMI) 39.0-39.9, adult: Secondary | ICD-10-CM

## 2020-07-31 DIAGNOSIS — Z0000 Encounter for general adult medical examination without abnormal findings: Secondary | ICD-10-CM

## 2020-07-31 DIAGNOSIS — E785 Hyperlipidemia, unspecified: Secondary | ICD-10-CM

## 2020-07-31 LAB — LIPID PANEL
Cholesterol: 144 mg/dL (ref 0–200)
HDL: 44.2 mg/dL (ref >39.00)
LDL Cholesterol: 76 mg/dL (ref 0–99)
NonHDL: 99.65
Total CHOL/HDL Ratio: 3
Triglycerides: 117 mg/dL (ref 0.0–149.0)
VLDL: 23.4 mg/dL (ref 0.0–40.0)

## 2020-07-31 LAB — COMPREHENSIVE METABOLIC PANEL
ALT: 12 U/L (ref 0–53)
AST: 15 U/L (ref 0–37)
Albumin: 4.1 g/dL (ref 3.5–5.2)
Alkaline Phosphatase: 72 U/L (ref 39–117)
BUN: 34 mg/dL — ABNORMAL HIGH (ref 6–23)
CO2: 26 mEq/L (ref 19–32)
Calcium: 9.9 mg/dL (ref 8.4–10.5)
Chloride: 107 mEq/L (ref 96–112)
Creatinine, Ser: 1.5 mg/dL (ref 0.40–1.50)
GFR: 45.86 mL/min — ABNORMAL LOW (ref >60.00)
Glucose, Bld: 95 mg/dL (ref 70–99)
Potassium: 5.3 mEq/L — ABNORMAL HIGH (ref 3.5–5.1)
Sodium: 139 mEq/L (ref 135–145)
Total Bilirubin: 0.4 mg/dL (ref 0.2–1.2)
Total Protein: 6.8 g/dL (ref 6.0–8.3)

## 2020-07-31 NOTE — Patient Instructions (Addendum)
Please stop at the lab to have labs drawn.  Work on [employer] , regular exercise, weight loss.

## 2020-07-31 NOTE — Assessment & Plan Note (Signed)
Stable, chronic.  Continue current medication.   on lisinopril 10 mg daily, amlodipine 10 mg daily and  eplerenone 25 m daily

## 2020-07-31 NOTE — Assessment & Plan Note (Signed)
Due fore re-eval on repatha. Goal LDL < 70.

## 2020-07-31 NOTE — Assessment & Plan Note (Signed)
Stable, chronic.  Continue current medication.    On depakote 250 mg BID

## 2020-07-31 NOTE — Assessment & Plan Note (Signed)
Encouraged exercise, weight loss, healthy eating habits. ? ?

## 2020-07-31 NOTE — Progress Notes (Signed)
Patient ID: Joshua Stark, male    DOB: [birth year], 75 y.o.   MRN: [id number]  This visit was conducted in person.  BP 128/62   Pulse (!) 57   Temp 98.1 F (36.7 C) (Temporal)   Ht 5\' 10"  (1.778 m)   Wt 274 lb (124.3 kg)   SpO2 96%   BMI 39.31 kg/m    CC: Chief Complaint  Patient presents with   Annual Exam    Subjective:   HPI: Joshua Stark is a 75 y.o. male presenting on 07/31/2020 for Annual Exam  The patient presents for complete physical and review of chronic health problems. He/She also has the following acute concerns today: none  The patient saw a LPN or RN for medicare wellness visit.  Prevention and wellness was reviewed in detail. Note reviewed and important notes copied below. Health Maintenance: Shingrix- due Covid- declined     Abnormal Screenings: None  07/31/20  Reviewed last cardiology OV Dr. Alethia Berthold  11/20/2020  Hypertension:    Good control on lisinopril 10 mg daily, amlodipine 10 mg daily and  eplerenone 25 m daily BP Readings from Last 3 Encounters:  07/31/20 128/62  07/05/19 120/70  05/31/19 (!) 122/51  Using medication without problems or lightheadedness:  none Chest pain with exertion:none Edema:none Short of breath:none Average home BPs: Other issues:  Elevated Cholesterol: tried Pravachol, Lipitor and Crestor Now on repatha. LDL almost at goal < 70 for CAD in past but missed few injection. LDL in 12/2019 LDL 100.  Using medications without problems: none Muscle aches:  Diet compliance: moderate Exercise: walking daily.Marland Kitchen except when hot. Other complaints:    Seizure D/O:  No seizures or SE on current depakote dose.     Relevant past medical, surgical, family and social history reviewed and updated as indicated. Interim medical history since our last visit reviewed. Allergies and medications reviewed and updated. Outpatient Medications Prior to Visit  Medication Sig Dispense Refill   amLODipine (NORVASC) 5 MG tablet Take  2 tablets (10 mg total) by mouth daily. 30 tablet 10   DEPAKOTE 250 MG DR tablet TAKE 1 TABLET BY MOUTH TWICE A DAY 180 tablet 3   eplerenone (INSPRA) 25 MG tablet Take 25 mg by mouth daily.     Evolocumab with Infusor 420 MG/3.5ML SOCT Inject 3.5 mLs into the skin every 30 (thirty) days.      furosemide (LASIX) 20 MG tablet Take 1 tablet (20 mg total) by mouth daily. 5 tablet 0   Garlic 751 MG TABS Take 500 mg by mouth daily.     lisinopril (ZESTRIL) 20 MG tablet Take 10 mg by mouth daily.     Multiple Vitamin (MULTIVITAMIN) capsule Take 1 capsule by mouth daily.      NITROSTAT 0.4 MG SL tablet DISSOLVE 1 TABLET UNDER THE TONGUE FOR CHEST PAIN. MAY REPEAT EVERY 5MINUTES UP TO 3 DOSES. IF NO RELIEF, CALL 911** (Patient taking differently: Place 0.4 mg under the tongue every 5 (five) minutes as needed for chest pain.) 25 tablet 2   No facility-administered medications prior to visit.     Per HPI unless specifically indicated in ROS section below Review of Systems  Constitutional:  Negative for fatigue and fever.  HENT:  Negative for ear pain.   Eyes:  Negative for pain.  Respiratory:  Negative for cough and shortness of breath.   Cardiovascular:  Negative for chest pain, palpitations and leg swelling.  Gastrointestinal:  Negative for abdominal pain.  Genitourinary:  Negative for dysuria.  Musculoskeletal:  Negative for arthralgias.  Neurological:  Negative for syncope, light-headedness and headaches.  Psychiatric/Behavioral:  Negative for dysphoric mood.   Objective:  BP 128/62   Pulse (!) 57   Temp 98.1 F (36.7 C) (Temporal)   Ht 5\' 10"  (1.778 m)   Wt 274 lb (124.3 kg)   SpO2 96%   BMI 39.31 kg/m   Wt Readings from Last 3 Encounters:  07/31/20 274 lb (124.3 kg)  07/05/19 274 lb 8 oz (124.5 kg)  05/31/19 275 lb (124.7 kg)      Physical Exam Constitutional:      General: He is not in acute distress.    Appearance: Normal appearance. He is well-developed. He is not  ill-appearing or toxic-appearing.  HENT:     Head: Normocephalic and atraumatic.     Right Ear: Hearing, tympanic membrane, ear canal and external ear normal.     Left Ear: Hearing, tympanic membrane, ear canal and external ear normal.     Nose: Nose normal.     Mouth/Throat:     Pharynx: Uvula midline.  Eyes:     General: Lids are normal. Lids are everted, no foreign bodies appreciated.     Conjunctiva/sclera: Conjunctivae normal.     Pupils: Pupils are equal, round, and reactive to light.  Neck:     Thyroid: No thyroid mass or thyromegaly.     Vascular: No carotid bruit.     Trachea: Trachea and phonation normal.  Cardiovascular:     Rate and Rhythm: Normal rate and regular rhythm.     Pulses: Normal pulses.     Heart sounds: S1 normal and S2 normal. No murmur heard.   No gallop.  Pulmonary:     Breath sounds: Normal breath sounds. No wheezing, rhonchi or rales.  Abdominal:     General: Bowel sounds are normal.     Palpations: Abdomen is soft.     Tenderness: There is no abdominal tenderness. There is no guarding or rebound.     Hernia: No hernia is present.  Musculoskeletal:     Cervical back: Normal range of motion and neck supple.  Lymphadenopathy:     Cervical: No cervical adenopathy.  Skin:    General: Skin is warm and dry.     Findings: No rash.  Neurological:     Mental Status: He is alert.     Cranial Nerves: No cranial nerve deficit.     Sensory: No sensory deficit.     Gait: Gait normal.     Deep Tendon Reflexes: Reflexes are normal and symmetric.  Psychiatric:        Speech: Speech normal.        Behavior: Behavior normal.        Judgment: Judgment normal.      Results for orders placed or performed in visit on 07/05/19  VITAMIN D 25 Hydroxy (Vit-D Deficiency, Fractures)  Result Value Ref Range   VITD 47.28 30.00 - 100.00 ng/mL  Vitamin B12  Result Value Ref Range   Vitamin B-12 375 211 - 911 pg/mL    This visit occurred during the SARS-CoV-2  public health emergency.  Safety protocols were in place, including screening questions prior to the visit, additional usage of staff PPE, and extensive cleaning of exam room while observing appropriate contact time as indicated for disinfecting solutions.   COVID 19 screen:  No recent travel or known exposure to [place] The patient denies respiratory symptoms of  COVID 19 at this time. The importance of social distancing was discussed today.   Assessment and Plan   The patient's preventative maintenance and recommended screening tests for an annual wellness exam were reviewed in full today. Brought up to date unless services declined.  Counselled on the importance of diet, exercise, and its role in overall health and mortality. The patient's FH and SH was reviewed, including their home life, tobacco status, and drug and alcohol status.   Stable PSA in past.. not indicated after age 28 Colon: 11/2015 tubular adenoma, repeat 5 years.. due after 11/2020 Vaccines: up to date prevnar, pneumovax and Tdap, consider shingles.  Considering COVID vaccine Nonsmoker. HEP C: neg  Problem List Items Addressed This Visit     Essential hypertension (Chronic)    Stable, chronic.  Continue current medication.   on lisinopril 10 mg daily, amlodipine 10 mg daily and  eplerenone 25 m daily       Hyperlipidemia with target LDL less than 70 (Chronic)    Due fore re-eval on repatha. Goal LDL < 70.       Relevant Orders   Lipid panel   Comprehensive metabolic panel   Class 2 severe obesity with serious comorbidity and body mass index (BMI) of 39.0 to 39.9 in adult [facility])    Encouraged exercise, weight loss, healthy eating habits.        Seizure disorder (HCC)    Stable, chronic.  Continue current medication.    On depakote 250 mg BID       Statin intolerance   Other Visit Diagnoses     Routine general medical examination at a health care facility    -  Primary         Eliezer Lofts, MD

## 2020-08-06 ENCOUNTER — Telehealth: Payer: Self-pay | Admitting: Family Medicine

## 2020-08-06 DIAGNOSIS — E875 Hyperkalemia: Secondary | ICD-10-CM

## 2020-08-06 NOTE — Telephone Encounter (Signed)
-----   Message from Ellamae Sia sent at 08/05/2020 10:22 AM EDT ----- Regarding: Lab orders for Friday, 7.15.22 Lab orders for f/u labs, thanks

## 2020-08-08 ENCOUNTER — Other Ambulatory Visit (INDEPENDENT_AMBULATORY_CARE_PROVIDER_SITE_OTHER): Payer: Medicare Other

## 2020-08-08 ENCOUNTER — Other Ambulatory Visit: Payer: Self-pay

## 2020-08-08 DIAGNOSIS — E875 Hyperkalemia: Secondary | ICD-10-CM

## 2020-08-08 LAB — BASIC METABOLIC PANEL
BUN: 35 mg/dL — ABNORMAL HIGH (ref 6–23)
CO2: 24 mEq/L (ref 19–32)
Calcium: 9.5 mg/dL (ref 8.4–10.5)
Chloride: 109 mEq/L (ref 96–112)
Creatinine, Ser: 1.46 mg/dL (ref 0.40–1.50)
GFR: 47.36 mL/min — ABNORMAL LOW (ref >60.00)
Glucose, Bld: 97 mg/dL (ref 70–99)
Potassium: 5.1 mEq/L (ref 3.5–5.1)
Sodium: 141 mEq/L (ref 135–145)

## 2020-08-13 ENCOUNTER — Other Ambulatory Visit: Payer: Self-pay | Admitting: Family Medicine

## 2020-08-13 NOTE — Telephone Encounter (Signed)
Last office visit 07/31/2020 for CPE.  Last refilled 09/17/2019 for #180 with 3 refills.  Last Valproic Acid 06/28/2018.  Ok to refill?

## 2020-08-14 MED ORDER — DEPAKOTE 250 MG PO TBEC
250.0000 mg | DELAYED_RELEASE_TABLET | Freq: Two times a day (BID) | ORAL | 3 refills | Status: DC
Start: 2020-08-14 — End: 2021-08-20

## 2020-08-14 NOTE — Telephone Encounter (Addendum)
Resent refills as Depakote (DAW).  Printed off coupon for patient, since insurance will not cover Brand Name, to take to pharmacy.  He will stop by office to pick up.

## 2020-08-14 NOTE — Telephone Encounter (Signed)
Pt said he wants to speak with Butch Penny CMA; pt said the Depakote was refilled but was sent as generic and not name brand and pt said he has to have name brand because the generic does not work and pt said Butch Penny knows that so send note for Butch Penny CMA to call pt back after changed to namebrand. Sending the note to [facility].

## 2020-11-05 DIAGNOSIS — I10 Essential (primary) hypertension: Secondary | ICD-10-CM | POA: Diagnosis not present

## 2020-11-05 DIAGNOSIS — Z8679 Personal history of other diseases of the circulatory system: Secondary | ICD-10-CM | POA: Diagnosis not present

## 2020-11-05 DIAGNOSIS — I2510 Atherosclerotic heart disease of native coronary artery without angina pectoris: Secondary | ICD-10-CM | POA: Diagnosis not present

## 2020-12-11 DIAGNOSIS — E782 Mixed hyperlipidemia: Secondary | ICD-10-CM | POA: Diagnosis not present

## 2020-12-11 DIAGNOSIS — Z862 Personal history of diseases of the blood and blood-forming organs and certain disorders involving the immune mechanism: Secondary | ICD-10-CM | POA: Diagnosis not present

## 2020-12-11 DIAGNOSIS — I2510 Atherosclerotic heart disease of native coronary artery without angina pectoris: Secondary | ICD-10-CM | POA: Diagnosis not present

## 2020-12-11 DIAGNOSIS — N1831 Chronic kidney disease, stage 3a: Secondary | ICD-10-CM | POA: Diagnosis not present

## 2020-12-11 DIAGNOSIS — Z789 Other specified health status: Secondary | ICD-10-CM | POA: Diagnosis not present

## 2020-12-11 DIAGNOSIS — R7303 Prediabetes: Secondary | ICD-10-CM | POA: Diagnosis not present

## 2020-12-11 DIAGNOSIS — D369 Benign neoplasm, unspecified site: Secondary | ICD-10-CM | POA: Diagnosis not present

## 2020-12-11 DIAGNOSIS — I1310 Hypertensive heart and chronic kidney disease without heart failure, with stage 1 through stage 4 chronic kidney disease, or unspecified chronic kidney disease: Secondary | ICD-10-CM | POA: Diagnosis not present

## 2020-12-11 DIAGNOSIS — Z1211 Encounter for screening for malignant neoplasm of colon: Secondary | ICD-10-CM | POA: Diagnosis not present

## 2020-12-26 DIAGNOSIS — I10 Essential (primary) hypertension: Secondary | ICD-10-CM | POA: Diagnosis not present

## 2021-01-05 ENCOUNTER — Telehealth: Payer: Self-pay | Admitting: Family Medicine

## 2021-01-05 NOTE — Telephone Encounter (Signed)
Spoke with Joshua Stark.  He states they are changing pharmacies to [facility].  Pharmacy updated in patient's chart.  He also mentioned possible needing a new coupon I usually give him for his Brand Name Depakote but he wants to check with [facility] first to make sure they will take it so he states he doesn't not need a coupon currently but will call me back if he finds he needs me to print him off a savings coupon.

## 2021-01-05 NOTE — Telephone Encounter (Signed)
Please give the pt a call regarding his DEPAKOTE 250 MG DR tablet Medication, he has a question  [phone]

## 2021-01-06 ENCOUNTER — Telehealth: Payer: Self-pay | Admitting: *Deleted

## 2021-01-06 NOTE — Telephone Encounter (Signed)
Received fax from Joshua Stark requesting PA for Depakote ER 250 mg.  PA completed on CoverMyMeds and sent to [employer] for review.  Can take up to 72 hours for a decision.

## 2021-01-07 NOTE — Telephone Encounter (Signed)
PA denied.  Depakote ER is not FDA approved or supported by an accepted reference for seizure disorder.  In order to approve the medication, you must also have a Complex partial seizures or Simple and Complex absence seizures.  Therefore the drug is denied because it is not being used for a medically accepted indication. [facility] notified of denial via fax.

## 2021-01-08 ENCOUNTER — Telehealth: Payer: Self-pay | Admitting: Family Medicine

## 2021-01-08 NOTE — Telephone Encounter (Signed)
Error

## 2021-01-08 NOTE — Telephone Encounter (Signed)
Spoke with patient. Patient is not sure if it was ever rulled our to be a certain seizure. He will try and look through his records and let us know. I do not see anything in his chart in the system. Need to call insurance to appeal this. Patient has been on this medication for years. First seizure episodes were when he was a teenager. Patient states he is not sure if they ever ruled out for sure that it was seizure.

## 2021-01-09 NOTE — Telephone Encounter (Signed)
Called [phone], completed appeal. It can take up to 72 hours for response. Reference number for the call is [id number]

## 2021-01-09 NOTE — Telephone Encounter (Signed)
Spoke with patient and he states that he would get jerking movements and pass out-he would pass out for a few seconds or so not long. Calling insurance now to appeal

## 2021-01-09 NOTE — Telephone Encounter (Signed)
Noted. Will work on this with insurance.

## 2021-01-09 NOTE — Telephone Encounter (Signed)
Pt called back and said he had an epileptic seizure.

## 2021-01-09 NOTE — Telephone Encounter (Signed)
Please call insurance.Marland Kitchen given them more info about the patients seizures... given he stated epilepsy ( you can call pt and have him describe the seizure.. staring or loss of consciousness, fall to floor, m,movements  etc  Please expedite.

## 2021-01-09 NOTE — Telephone Encounter (Signed)
Dr Diona Browner, I can call insurance to see if we can appeal since patient has been on this medication for many many years and has been stable with his symptoms. I need to know if this needs to be an expedited request or regular? Phone number to call about this is [phone] [address] to ask for appeal is by phone if need an urgent review.

## 2021-01-13 NOTE — Telephone Encounter (Signed)
Resubmitted PA for Depakote DR 250 mg through CoverMyMeds.  PA was approved through 01/24/2022.  [facility] notified of approval via fax.  Mr. Mcqueen notified of approval via telephone.

## 2021-03-12 DIAGNOSIS — D369 Benign neoplasm, unspecified site: Secondary | ICD-10-CM | POA: Diagnosis not present

## 2021-03-12 DIAGNOSIS — I2510 Atherosclerotic heart disease of native coronary artery without angina pectoris: Secondary | ICD-10-CM | POA: Diagnosis not present

## 2021-03-12 DIAGNOSIS — N1831 Chronic kidney disease, stage 3a: Secondary | ICD-10-CM | POA: Diagnosis not present

## 2021-03-12 DIAGNOSIS — Z789 Other specified health status: Secondary | ICD-10-CM | POA: Diagnosis not present

## 2021-03-12 DIAGNOSIS — R7303 Prediabetes: Secondary | ICD-10-CM | POA: Diagnosis not present

## 2021-03-12 DIAGNOSIS — I129 Hypertensive chronic kidney disease with stage 1 through stage 4 chronic kidney disease, or unspecified chronic kidney disease: Secondary | ICD-10-CM | POA: Diagnosis not present

## 2021-03-24 DIAGNOSIS — H5202 Hypermetropia, left eye: Secondary | ICD-10-CM | POA: Diagnosis not present

## 2021-03-24 DIAGNOSIS — H52223 Regular astigmatism, bilateral: Secondary | ICD-10-CM | POA: Diagnosis not present

## 2021-03-24 DIAGNOSIS — H2512 Age-related nuclear cataract, left eye: Secondary | ICD-10-CM | POA: Diagnosis not present

## 2021-03-24 DIAGNOSIS — H524 Presbyopia: Secondary | ICD-10-CM | POA: Diagnosis not present

## 2021-05-22 IMAGING — CR LEFT FEMUR 2 VIEWS
4 series · 4 of 4 positions shown · non-contrast
Comparison: None.

CLINICAL DATA: Patient tripped and fell

EXAM:
LEFT FEMUR 2 VIEWS

[x femur proximal ap left]
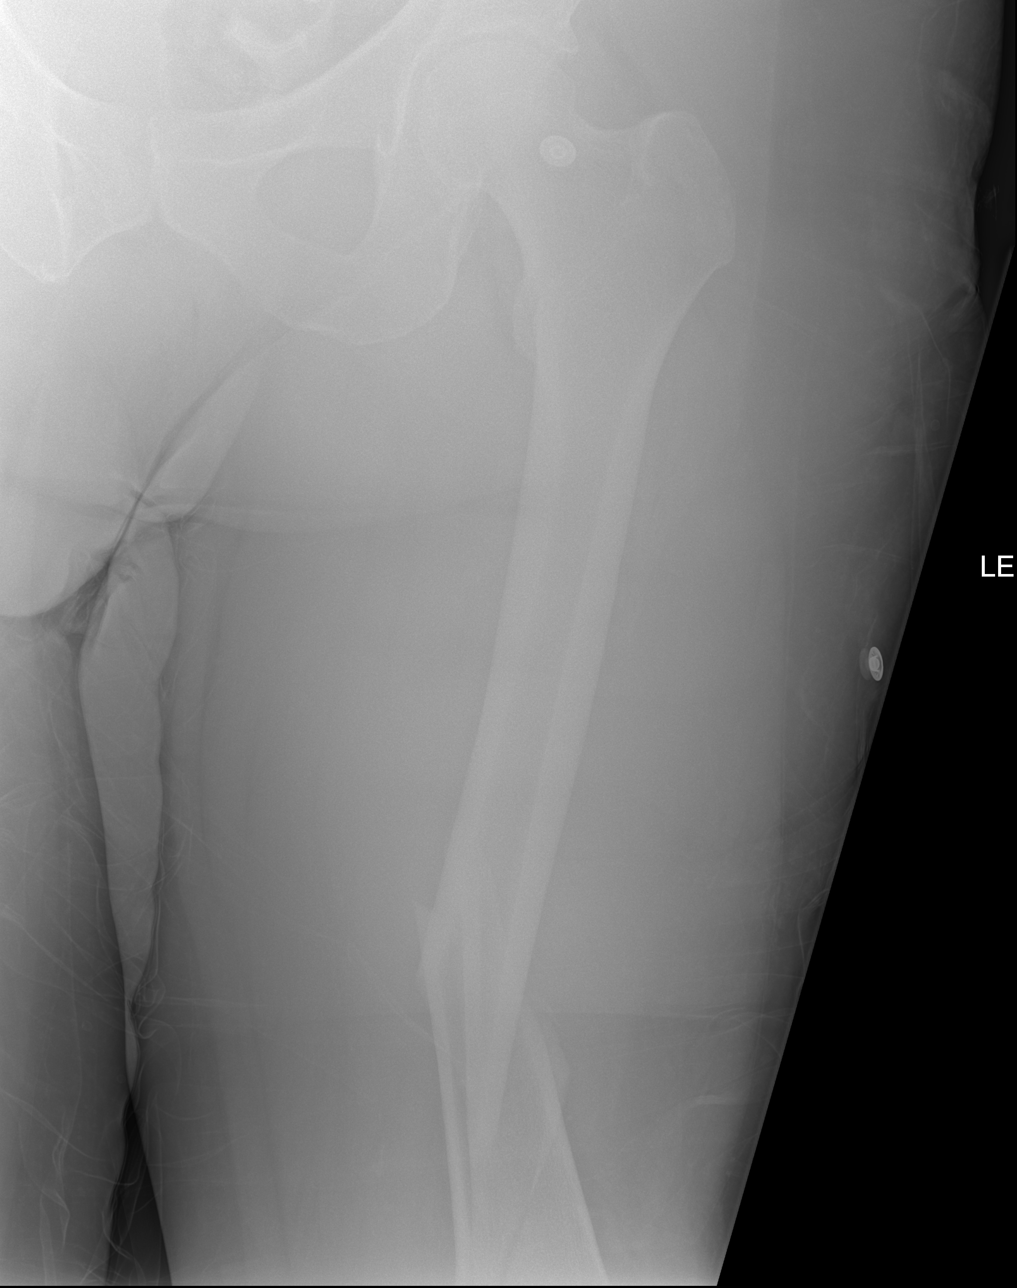

[x femur distal ap left]
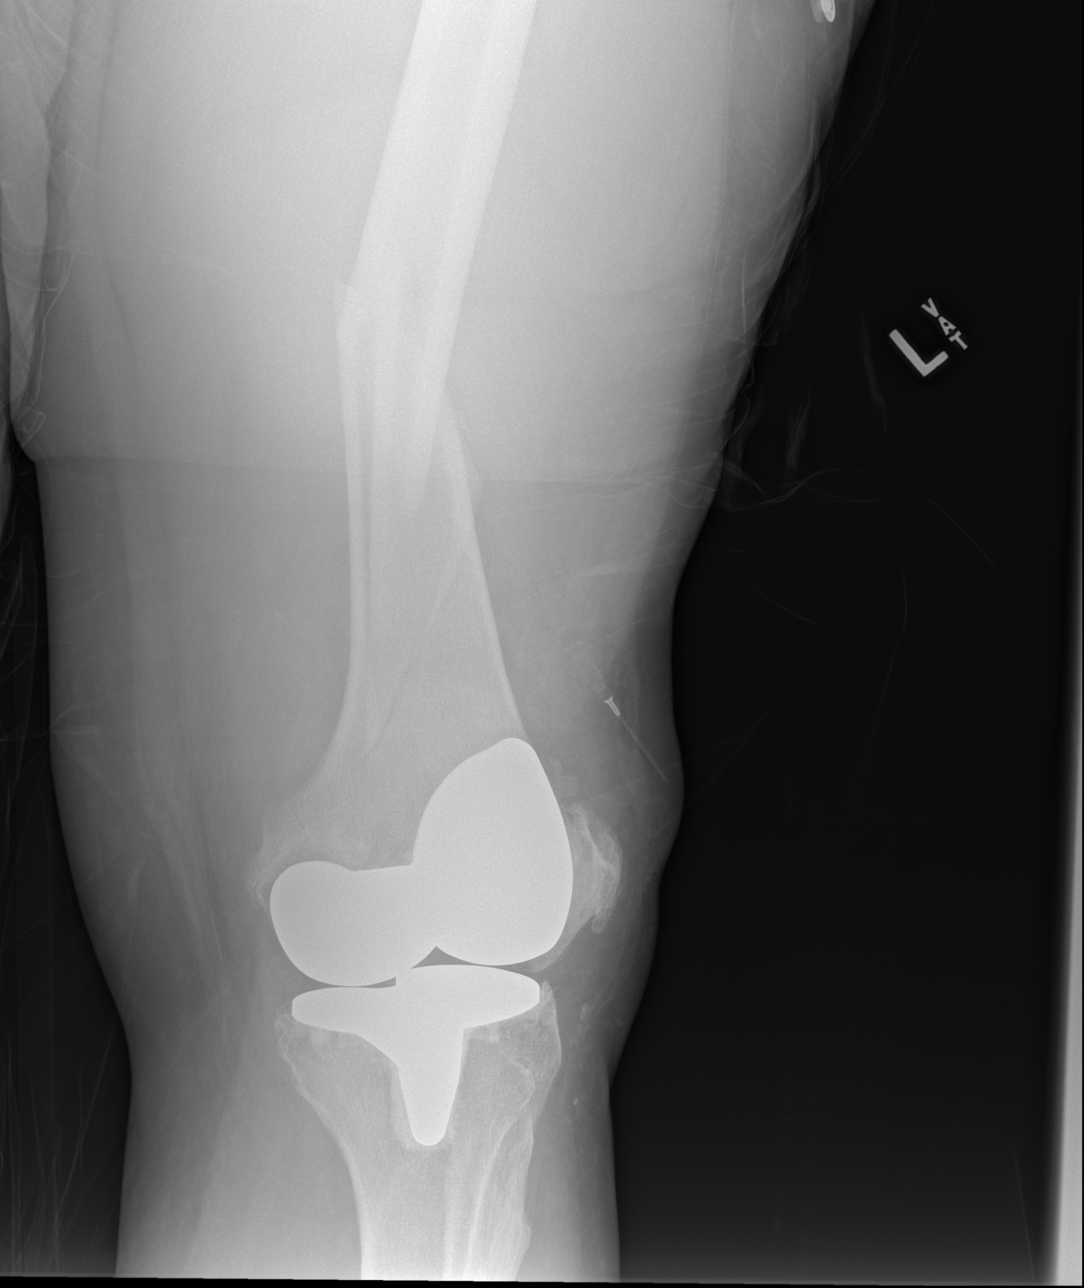

[w femur distal lat left (1 of 2)]
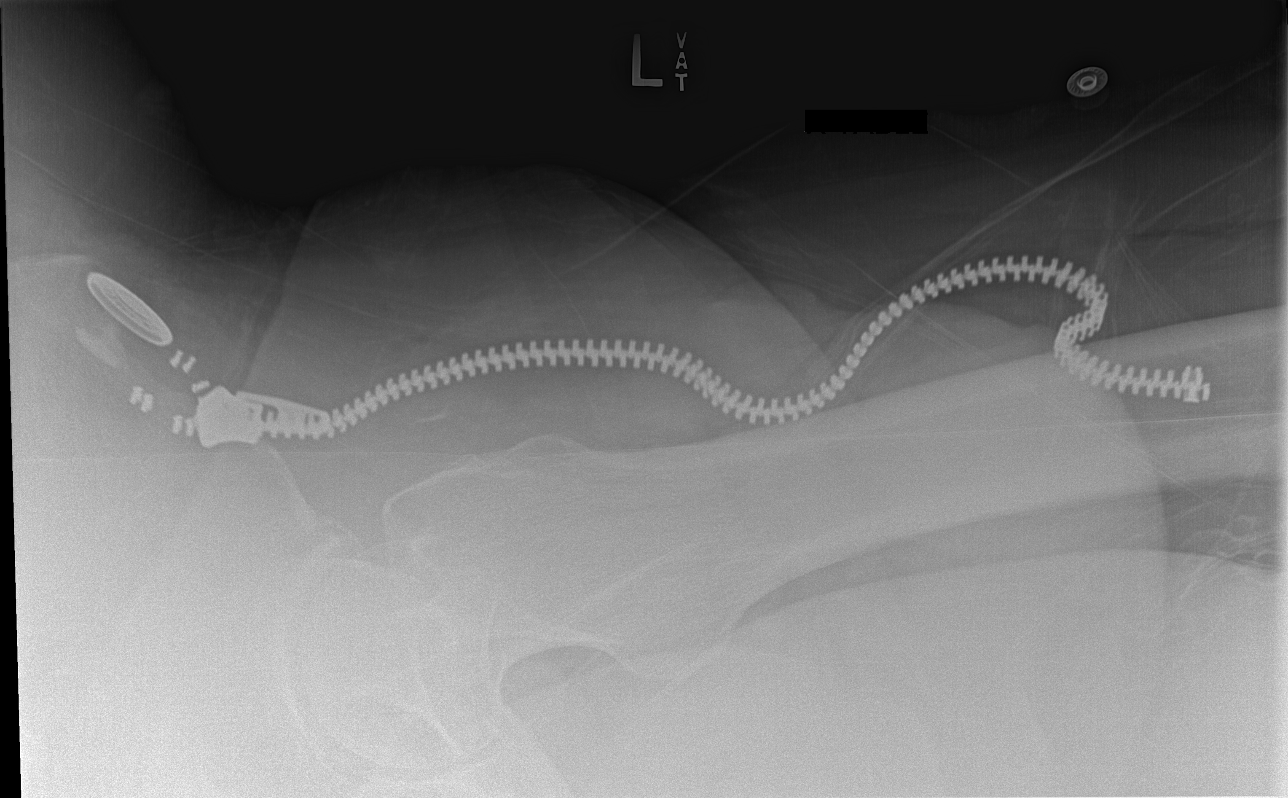

[w femur distal lat left (2 of 2)]
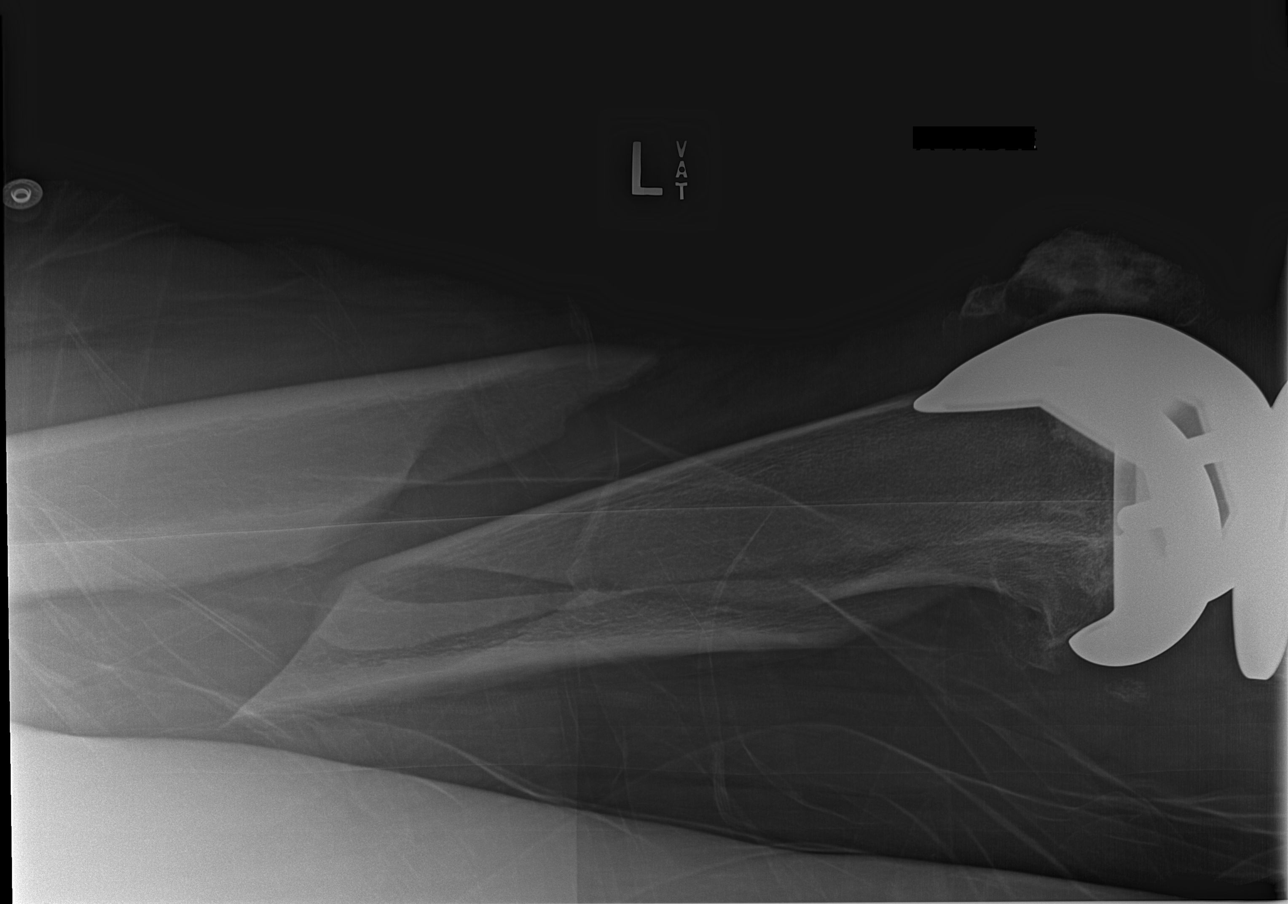

[4 of 4 positions shown; findings below may reference images not displayed]

FINDINGS: There is obliquely oriented comminuted fracture of the midshaft of
the femur with medial angulation and overlap of fracture fragments.
The fracture line extends to the distal femur, however the total
knee arthroplasty appears to be intact. There is overlying soft
tissue swelling.
IMPRESSION: Comminuted obliquely oriented fracture seen through the mid to
distal femur. Left total knee arthroplasty which appears to be
intact.

## 2021-05-24 IMAGING — US US RENAL
1 series · 14 of 25 positions shown · non-contrast
Comparison: December 03, 2015

CLINICAL DATA: Acute kidney injury

EXAM:
RENAL / URINARY TRACT ULTRASOUND COMPLETE

[Series 1: us renal · 14 of 27 slices shown]
[im 1/27]
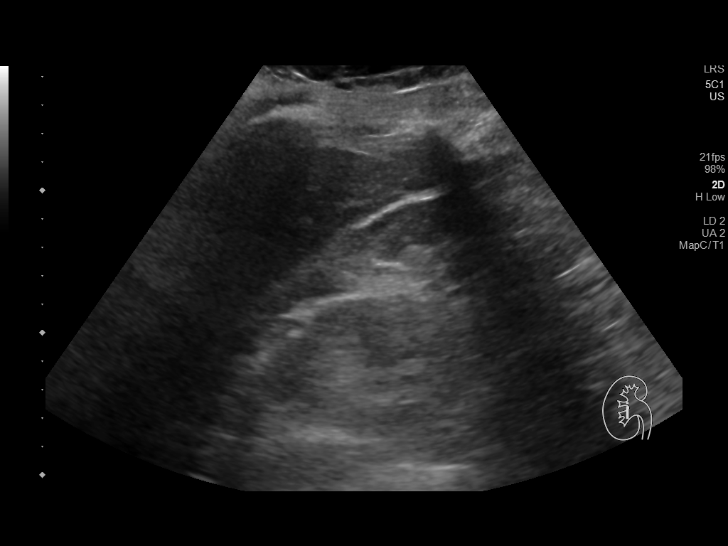
[im 3/27]
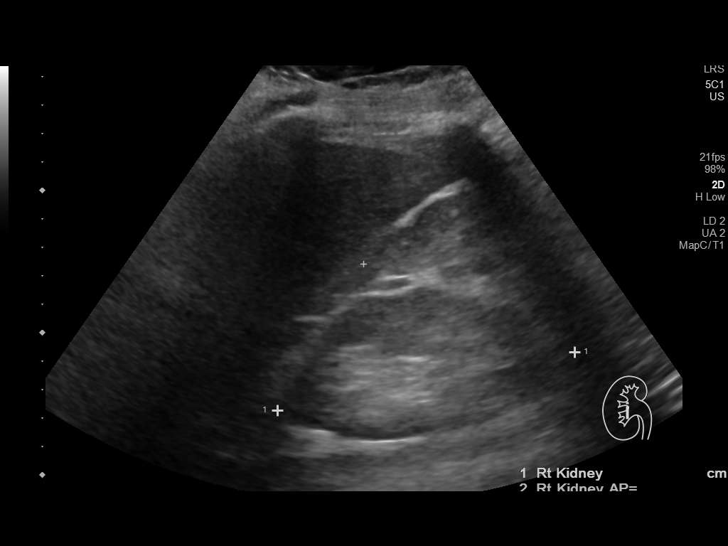
[im 5/27]
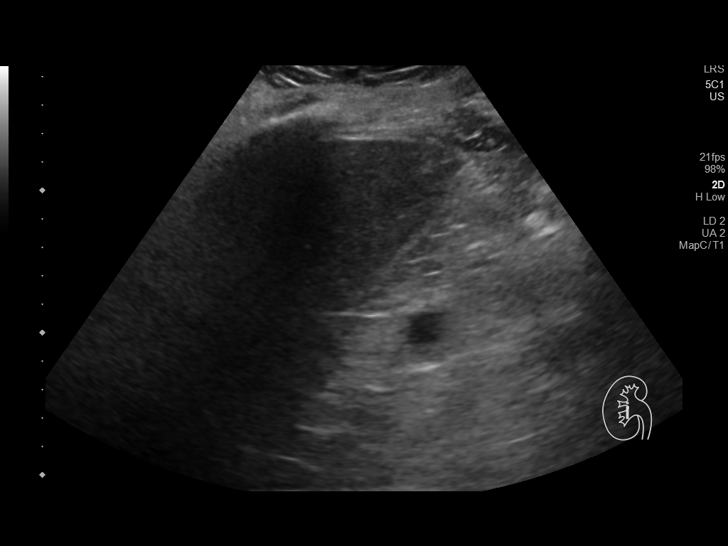
[im 7/27]
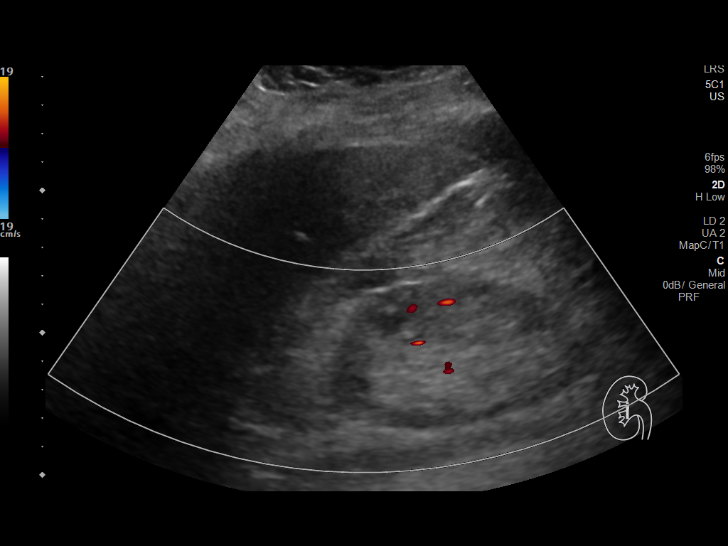
[im 9/27]
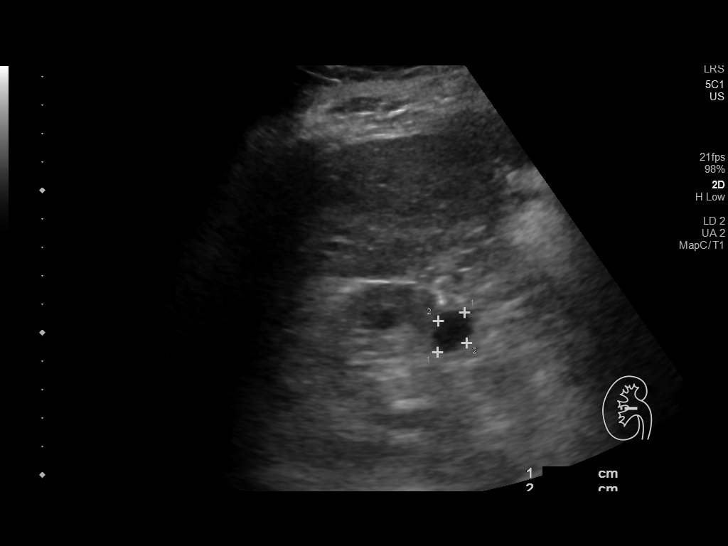
[im 10/27]
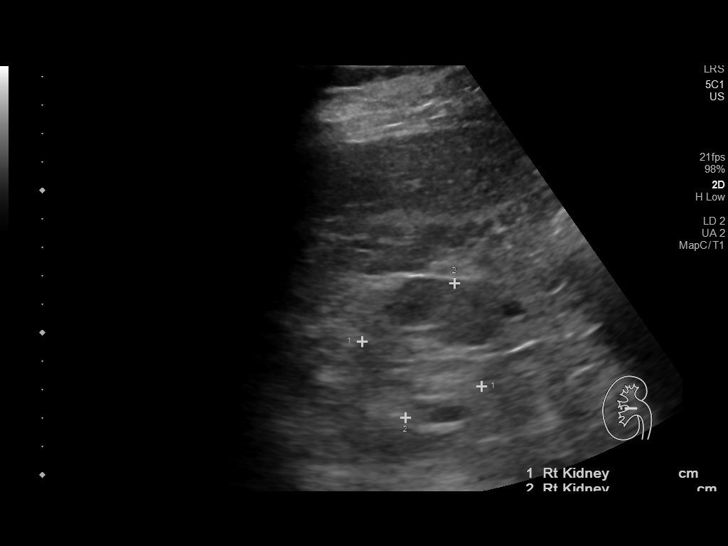
[im 12/27]
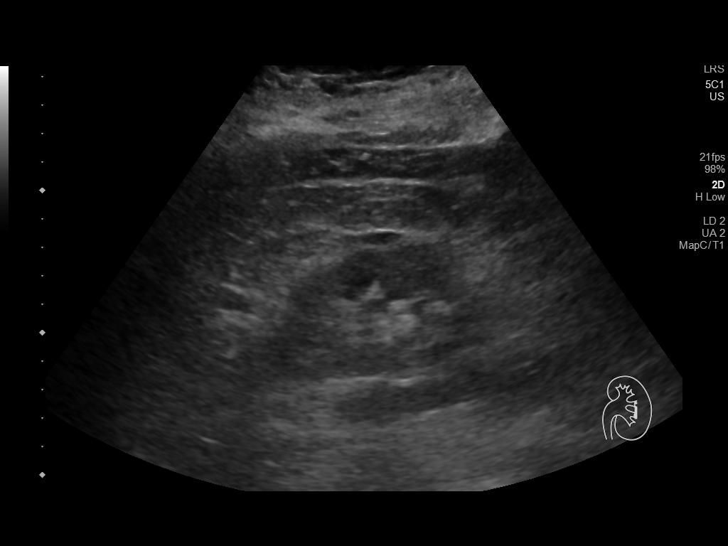
[im 15/27]
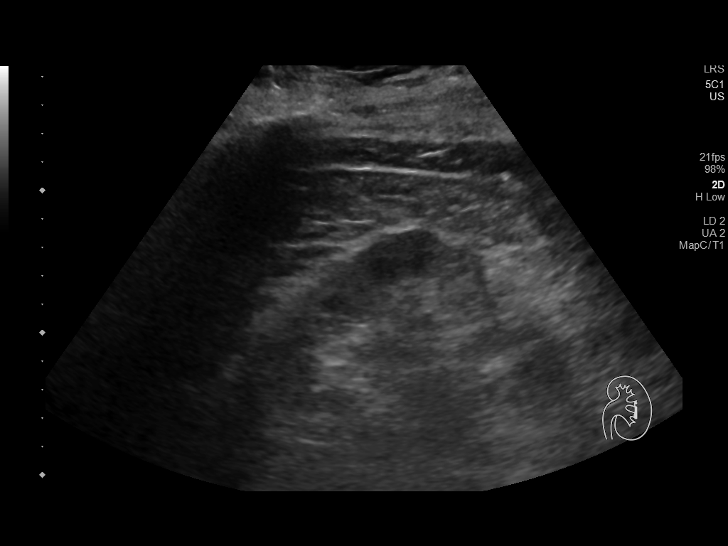
[im 17/27]
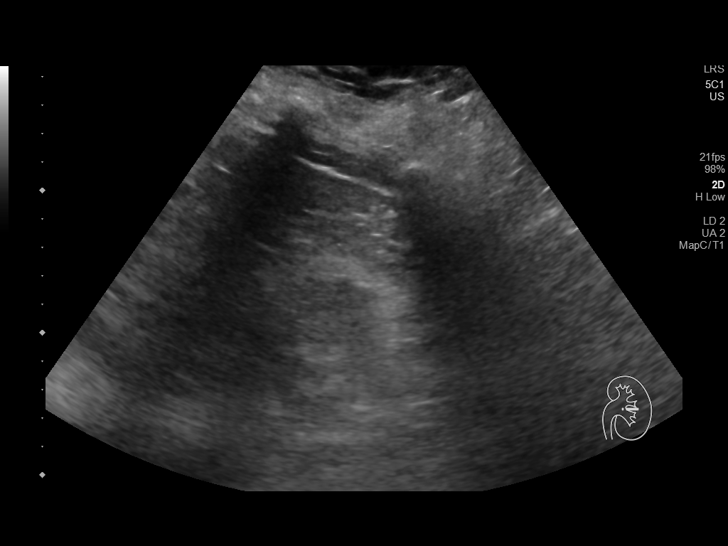
[im 18/27]
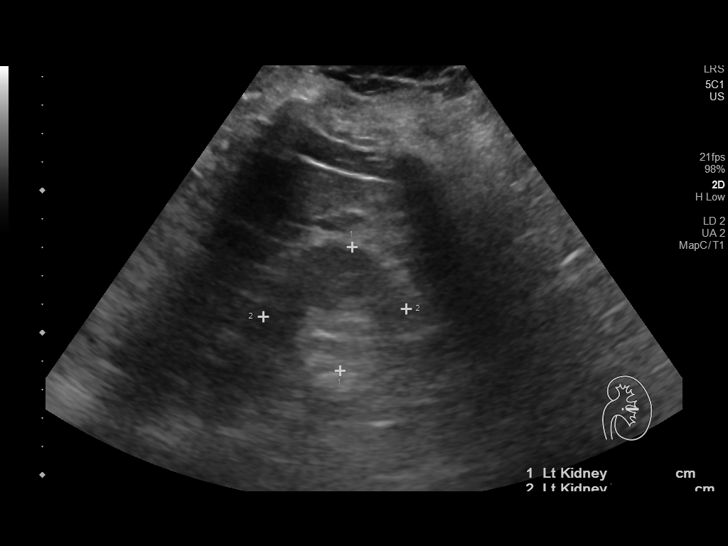
[im 20/27]
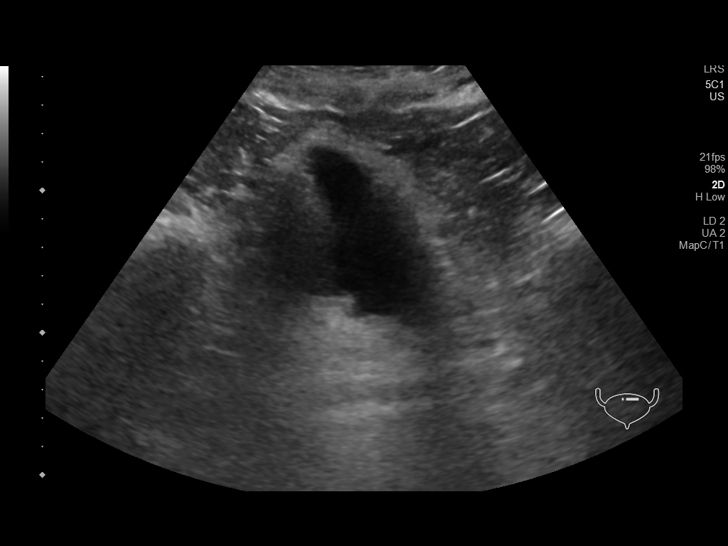
[im 22/27]
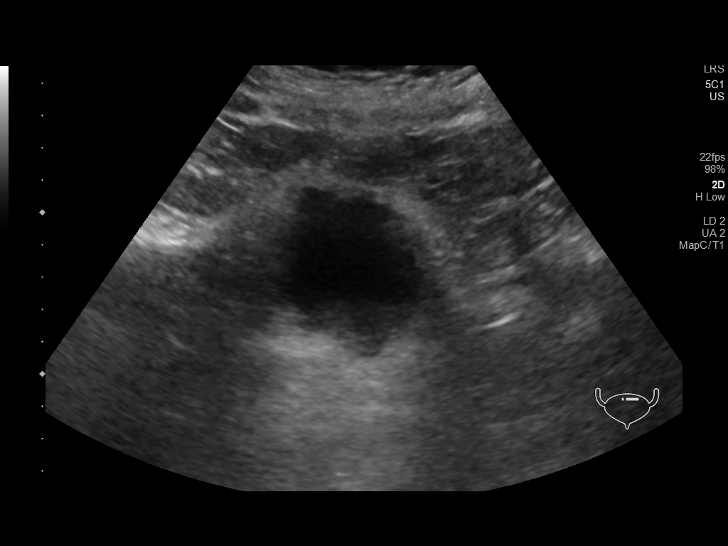
[im 24/27]
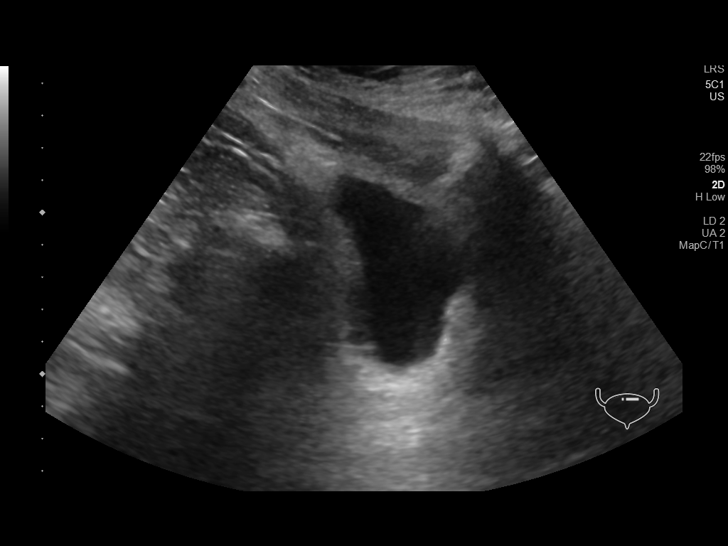
[im 27/27]
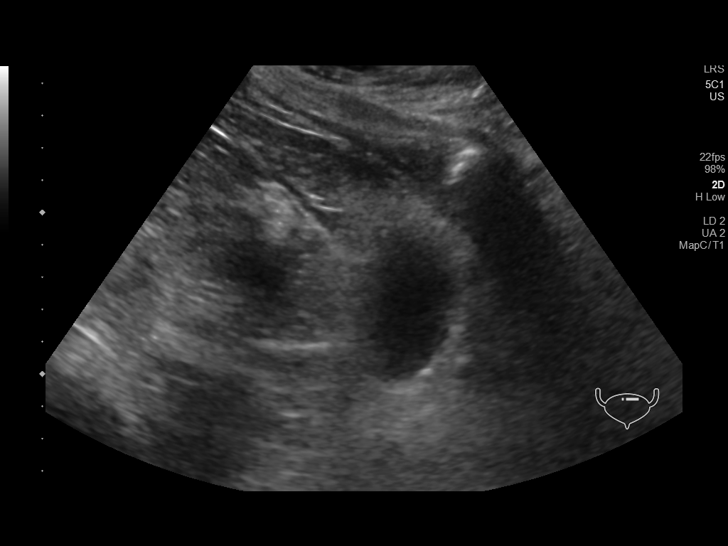

[14 of 25 positions shown; findings below may reference images not displayed]

FINDINGS: Right Kidney:

Renal measurements: 10.6 x 4.5 x 5.0 cm = volume: 125 mL. There
appears to be slight cortical thinning with increased echogenicity.
A tiny anechoic cyst seen within the midpole measuring 1.7 cm.

Left Kidney:

Renal measurements: 10.9 x 4.4 x 5.0 cm = volume: 126 mL. There
appears to be slight cortical thinning and increased echogenicity.
No renal masses seen.

Bladder:

The bladder is partially distended.
IMPRESSION: No acute kidney or bladder abnormality.

## 2021-06-04 DIAGNOSIS — Z961 Presence of intraocular lens: Secondary | ICD-10-CM | POA: Diagnosis not present

## 2021-06-04 DIAGNOSIS — H25012 Cortical age-related cataract, left eye: Secondary | ICD-10-CM | POA: Diagnosis not present

## 2021-06-04 DIAGNOSIS — H18413 Arcus senilis, bilateral: Secondary | ICD-10-CM | POA: Diagnosis not present

## 2021-06-04 DIAGNOSIS — H2512 Age-related nuclear cataract, left eye: Secondary | ICD-10-CM | POA: Diagnosis not present

## 2021-06-09 DIAGNOSIS — D369 Benign neoplasm, unspecified site: Secondary | ICD-10-CM | POA: Diagnosis not present

## 2021-06-09 DIAGNOSIS — N1831 Chronic kidney disease, stage 3a: Secondary | ICD-10-CM | POA: Diagnosis not present

## 2021-06-09 DIAGNOSIS — R7303 Prediabetes: Secondary | ICD-10-CM | POA: Diagnosis not present

## 2021-06-09 DIAGNOSIS — I129 Hypertensive chronic kidney disease with stage 1 through stage 4 chronic kidney disease, or unspecified chronic kidney disease: Secondary | ICD-10-CM | POA: Diagnosis not present

## 2021-06-15 DIAGNOSIS — H2512 Age-related nuclear cataract, left eye: Secondary | ICD-10-CM | POA: Diagnosis not present

## 2021-07-21 ENCOUNTER — Ambulatory Visit (INDEPENDENT_AMBULATORY_CARE_PROVIDER_SITE_OTHER): Payer: Medicare Other

## 2021-07-21 VITALS — Ht 71.0 in | Wt 248.0 lb

## 2021-07-21 DIAGNOSIS — Z0000 Encounter for general adult medical examination without abnormal findings: Secondary | ICD-10-CM | POA: Diagnosis not present

## 2021-08-17 DIAGNOSIS — H5711 Ocular pain, right eye: Secondary | ICD-10-CM | POA: Diagnosis not present

## 2021-08-19 ENCOUNTER — Other Ambulatory Visit: Payer: Self-pay | Admitting: Family Medicine

## 2021-08-19 NOTE — Telephone Encounter (Signed)
Patient has been scheduled

## 2021-08-19 NOTE — Telephone Encounter (Signed)
Last office visit 07/31/2020 for CPE.  Last refilled 08/14/20 for #180 with 3 refills.  Last Depakote level 06/28/2018.  No future appointments with PCP.  Please schedule CPE with fasting labs prior with Dr. Diona Browner.  He has already has his [place] with nurse.

## 2021-09-19 ENCOUNTER — Telehealth: Payer: Self-pay | Admitting: Family Medicine

## 2021-09-19 DIAGNOSIS — E785 Hyperlipidemia, unspecified: Secondary | ICD-10-CM

## 2021-09-19 NOTE — Telephone Encounter (Signed)
-----   Message from Ellamae Sia sent at 09/16/2021  3:23 PM EDT ----- Regarding: Lab orders for Friday, 9.8.23 Patient is scheduled for CPX labs, please order future labs, Thanks , Karna Christmas

## 2021-10-02 ENCOUNTER — Other Ambulatory Visit (INDEPENDENT_AMBULATORY_CARE_PROVIDER_SITE_OTHER): Payer: Medicare Other

## 2021-10-02 DIAGNOSIS — E785 Hyperlipidemia, unspecified: Secondary | ICD-10-CM | POA: Diagnosis not present

## 2021-10-02 LAB — COMPREHENSIVE METABOLIC PANEL
ALT: 12 U/L (ref 0–53)
AST: 14 U/L (ref 0–37)
Albumin: 3.8 g/dL (ref 3.5–5.2)
Alkaline Phosphatase: 67 U/L (ref 39–117)
BUN: 39 mg/dL — ABNORMAL HIGH (ref 6–23)
CO2: 25 mEq/L (ref 19–32)
Calcium: 9.3 mg/dL (ref 8.4–10.5)
Chloride: 109 mEq/L (ref 96–112)
Creatinine, Ser: 1.65 mg/dL — ABNORMAL HIGH (ref 0.40–1.50)
GFR: 40.57 mL/min — ABNORMAL LOW (ref >60.00)
Glucose, Bld: 95 mg/dL (ref 70–99)
Potassium: 5.4 mEq/L — ABNORMAL HIGH (ref 3.5–5.1)
Sodium: 139 mEq/L (ref 135–145)
Total Bilirubin: 0.4 mg/dL (ref 0.2–1.2)
Total Protein: 6.4 g/dL (ref 6.0–8.3)

## 2021-10-02 LAB — LIPID PANEL
Cholesterol: 133 mg/dL (ref 0–200)
HDL: 45 mg/dL (ref >39.00)
LDL Cholesterol: 71 mg/dL (ref 0–99)
NonHDL: 88.49
Total CHOL/HDL Ratio: 3
Triglycerides: 88 mg/dL (ref 0.0–149.0)
VLDL: 17.6 mg/dL (ref 0.0–40.0)

## 2021-10-02 LAB — HEMOGLOBIN A1C: Hgb A1c MFr Bld: 6.1 % (ref 4.6–6.5)

## 2021-10-06 ENCOUNTER — Other Ambulatory Visit: Payer: Self-pay | Admitting: *Deleted

## 2021-10-06 ENCOUNTER — Other Ambulatory Visit: Payer: Self-pay | Admitting: Family Medicine

## 2021-10-06 DIAGNOSIS — E875 Hyperkalemia: Secondary | ICD-10-CM

## 2021-10-06 MED ORDER — FUROSEMIDE 20 MG PO TABS: 20.0000 mg | ORAL_TABLET | Freq: Every day | ORAL | 0 refills | Status: DC

## 2021-10-09 ENCOUNTER — Other Ambulatory Visit (INDEPENDENT_AMBULATORY_CARE_PROVIDER_SITE_OTHER): Payer: Medicare Other

## 2021-10-09 ENCOUNTER — Encounter: Payer: Medicare Other | Admitting: Family Medicine

## 2021-10-09 DIAGNOSIS — E875 Hyperkalemia: Secondary | ICD-10-CM

## 2021-10-09 NOTE — Addendum Note (Signed)
Addended by: Pilar Grammes on: 10/09/2021 03:21 PM   Modules accepted: Orders

## 2021-10-10 LAB — BASIC METABOLIC PANEL
BUN/Creatinine Ratio: 26 (calc) — ABNORMAL HIGH (ref 6–22)
BUN: 50 mg/dL — ABNORMAL HIGH (ref 7–25)
CO2: 20 mmol/L (ref 20–32)
Calcium: 9.3 mg/dL (ref 8.6–10.3)
Chloride: 108 mmol/L (ref 98–110)
Creat: 1.91 mg/dL — ABNORMAL HIGH (ref 0.70–1.28)
Glucose, Bld: 113 mg/dL — ABNORMAL HIGH (ref 65–99)
Potassium: 5 mmol/L (ref 3.5–5.3)
Sodium: 140 mmol/L (ref 135–146)

## 2021-10-12 ENCOUNTER — Encounter: Payer: Medicare Other | Admitting: Family Medicine

## 2021-10-13 ENCOUNTER — Encounter: Payer: Self-pay | Admitting: Pharmacist

## 2021-10-13 NOTE — Progress Notes (Signed)
[place] [facility])                                            [place] Team                                        Statin Quality Measure Assessment    10/13/2021  Joshua Stark [birth year] [id number]   Per review of chart and payor information, patient has a diagnosis of cardiovascular disease but is not currently filling a statin prescription.  This places patient into the [facility] (Statin Use In Patients with Cardiovascular Disease) measure for CMS.    Patient has documented trials of rosuvastatin, atorvastatin, and pravastatin with reported cramps, but no corresponding CPT codes that would exclude patient from [facility] measure.     Component Value Date/Time   CHOL 133 10/02/2021 0726   TRIG 88.0 10/02/2021 0726   HDL 45.00 10/02/2021 0726   CHOLHDL 3 10/02/2021 0726   VLDL 17.6 10/02/2021 0726   LDLCALC 71 10/02/2021 0726     Please consider ONE of the following recommendations:  Initiate high intensity statin Atorvastatin '40mg'$  once daily, #90, 3 refills   Rosuvastatin '20mg'$  once daily, #90, 3 refills    Initiate moderate intensity  statin with reduced frequency if prior  statin intolerance 1x weekly, #13, 3 refills   2x weekly, #26, 3 refills   3x weekly, #39, 3 refills    Code for past statin intolerance  (required annually)  Provider Requirements: Must asociate code during an office visit or telehealth encounter   Drug Induced Myopathy G72.0   Myalgia M79.1   Myositis, unspecified M60.9   Myopathy, unspecified G72.9   Rhabdomyolysis M62.82    Loretha Brasil, PharmD [place] Pharmacist Office: [phone]

## 2021-10-15 DIAGNOSIS — I129 Hypertensive chronic kidney disease with stage 1 through stage 4 chronic kidney disease, or unspecified chronic kidney disease: Secondary | ICD-10-CM | POA: Diagnosis not present

## 2021-10-15 DIAGNOSIS — R7303 Prediabetes: Secondary | ICD-10-CM | POA: Diagnosis not present

## 2021-10-15 DIAGNOSIS — N1831 Chronic kidney disease, stage 3a: Secondary | ICD-10-CM | POA: Diagnosis not present

## 2021-10-15 DIAGNOSIS — D369 Benign neoplasm, unspecified site: Secondary | ICD-10-CM | POA: Diagnosis not present

## 2021-10-16 ENCOUNTER — Encounter: Payer: Self-pay | Admitting: Family Medicine

## 2021-10-16 ENCOUNTER — Ambulatory Visit (INDEPENDENT_AMBULATORY_CARE_PROVIDER_SITE_OTHER): Payer: Medicare Other | Admitting: Family Medicine

## 2021-10-16 VITALS — BP 140/72 | HR 56 | Temp 98.1°F | Ht 69.25 in | Wt 264.0 lb

## 2021-10-16 DIAGNOSIS — I10 Essential (primary) hypertension: Secondary | ICD-10-CM

## 2021-10-16 DIAGNOSIS — Z6839 Body mass index (BMI) 39.0-39.9, adult: Secondary | ICD-10-CM

## 2021-10-16 DIAGNOSIS — G720 Drug-induced myopathy: Secondary | ICD-10-CM | POA: Diagnosis not present

## 2021-10-16 DIAGNOSIS — E875 Hyperkalemia: Secondary | ICD-10-CM

## 2021-10-16 DIAGNOSIS — E785 Hyperlipidemia, unspecified: Secondary | ICD-10-CM

## 2021-10-16 DIAGNOSIS — N182 Chronic kidney disease, stage 2 (mild): Secondary | ICD-10-CM

## 2021-10-16 DIAGNOSIS — Z0000 Encounter for general adult medical examination without abnormal findings: Secondary | ICD-10-CM

## 2021-10-16 NOTE — Assessment & Plan Note (Signed)
Recent worsening with addition of temporary Lasix x2 days.  Prior to that had some slight decrease likely due to decreased p.o. intake.  Most recent creatinine trended back towards baseline.  Per patient Dr. Sharmaine Base will be reevaluating this in a few months.

## 2021-10-16 NOTE — Patient Instructions (Addendum)
Work on low carb  and low cholesterol heart healthy diet, regular exercise and  weight loss.  Stop multivitamin given contains potassium.  Keep up with water intake, stop soda.  Set up colonoscopy as planned.

## 2021-10-16 NOTE — Assessment & Plan Note (Signed)
Chronic now LDL at goal on Repatha.  He is tolerating it well.

## 2021-10-16 NOTE — Addendum Note (Signed)
Addended by: Carter Kitten on: 10/16/2021 12:25 PM   Modules accepted: Orders

## 2021-10-16 NOTE — Assessment & Plan Note (Signed)
Encouraged exercise, weight loss, healthy eating habits. ? ?

## 2021-10-16 NOTE — Progress Notes (Signed)
Patient ID: Joshua Stark, male    DOB: 1945/08/30, 76 y.o.   MRN: [id number]  This visit was conducted in person.  BP (!) 140/72   Pulse (!) 56   Temp 98.1 F (36.7 C) (Oral)   Ht 5' 9.25" (1.759 m)   Wt 264 lb (119.7 kg)   SpO2 98%   BMI 38.71 kg/m    CC:  Chief Complaint  Patient presents with   Annual Exam    Part 2    Subjective:   HPI: Joshua Stark is a 76 y.o. male presenting on 10/16/2021 for Annual Exam (Part 2)  The patient presents for complete physical and review of chronic health problems. He/She also has the following acute concerns today:  Yesterday seen by Dr. Daisy Floro.. Another PCP for executive care... Med list does not match ours.  Hypertension:  Borderline control in office today on amlodipine  '10mg'$  p.o. daily, lisinopril 20 mg p.o. daily BP Readings from Last 3 Encounters:  10/16/21 (!) 140/72  07/31/20 128/62  07/05/19 120/70  Using medication without problems or lightheadedness:  none Chest pain with exertion: none Edema:none Short of breath:  occ Average home BPs: Other issues:  Hyperkalemia treated with lasix for elevated level.  Elevated Cholesterol: LDL less than 70  Tried pravachol, lipitor, crestor. Has statin intolerane. Now on Repatha. Lab Results  Component Value Date   CHOL 133 10/02/2021   HDL 45.00 10/02/2021   LDLCALC 71 10/02/2021   TRIG 88.0 10/02/2021   CHOLHDL 3 10/02/2021  Using medications without problems: none Muscle aches:  none  Diet compliance: hear healthy diet Exercise: staying active Other complaints:  CKD.Marland Kitchen worsened control.. better now on  Poor water intake.. drinking soda  Wt Readings from Last 3 Encounters:  10/16/21 264 lb (119.7 kg)  07/21/21 248 lb (112.5 kg)  07/31/20 274 lb (124.3 kg)   Body mass index is 38.71 kg/m.   Hx of seizures: Stable, remote on depakote.    New cardiologist.. Dr. Francella Solian  Relevant past medical, surgical, family and social history reviewed and updated as  indicated. Interim medical history since our last visit reviewed. Allergies and medications reviewed and updated. Outpatient Medications Prior to Visit  Medication Sig Dispense Refill   amLODipine (NORVASC) 5 MG tablet Take 2 tablets (10 mg total) by mouth daily. 30 tablet 10   aspirin EC 81 MG tablet Take 81 mg by mouth daily. Swallow whole.     DEPAKOTE 250 MG DR tablet TAKE ONE TABLET BY MOUTH TWICE DAILY 180 tablet 3   eplerenone (INSPRA) 25 MG tablet Take 25 mg by mouth daily.     Evolocumab with Infusor 420 MG/3.5ML SOCT Inject 3.5 mLs into the skin every 30 (thirty) days.      Garlic 834 MG TABS Take 500 mg by mouth daily.     lisinopril (ZESTRIL) 20 MG tablet Take 10 mg by mouth daily.     Multiple Vitamin (MULTIVITAMIN) capsule Take 1 capsule by mouth daily.      NITROSTAT 0.4 MG SL tablet DISSOLVE 1 TABLET UNDER THE TONGUE FOR CHEST PAIN. MAY REPEAT EVERY 5MINUTES UP TO 3 DOSES. IF NO RELIEF, CALL 911** 25 tablet 2   furosemide (LASIX) 20 MG tablet Take 1 tablet (20 mg total) by mouth daily. 2 tablet 0   No facility-administered medications prior to visit.     Per HPI unless specifically indicated in ROS section below Review of Systems  Constitutional:  Negative for fatigue  and fever.  HENT:  Negative for ear pain.   Eyes:  Negative for pain.  Respiratory:  Negative for cough and shortness of breath.   Cardiovascular:  Negative for chest pain, palpitations and leg swelling.  Gastrointestinal:  Negative for abdominal pain.  Genitourinary:  Negative for dysuria.  Musculoskeletal:  Negative for arthralgias.  Neurological:  Negative for syncope, light-headedness and headaches.  Psychiatric/Behavioral:  Negative for dysphoric mood.    Objective:  BP (!) 140/72   Pulse (!) 56   Temp 98.1 F (36.7 C) (Oral)   Ht 5' 9.25" (1.759 m)   Wt 264 lb (119.7 kg)   SpO2 98%   BMI 38.71 kg/m   Wt Readings from Last 3 Encounters:  10/16/21 264 lb (119.7 kg)  07/21/21 248 lb (112.5  kg)  07/31/20 274 lb (124.3 kg)      Physical Exam Constitutional:      Appearance: He is well-developed. He is obese.  HENT:     Head: Normocephalic.     Right Ear: Hearing normal.     Left Ear: Hearing normal.     Nose: Nose normal.  Neck:     Thyroid: No thyroid mass or thyromegaly.     Vascular: No carotid bruit.     Trachea: Trachea normal.  Cardiovascular:     Rate and Rhythm: Normal rate and regular rhythm.     Pulses: Normal pulses.     Heart sounds: Heart sounds not distant. No murmur heard.    No friction rub. No gallop.     Comments: No peripheral edema Pulmonary:     Effort: Pulmonary effort is normal. No respiratory distress.     Breath sounds: Normal breath sounds.  Skin:    General: Skin is warm and dry.     Findings: No rash.  Psychiatric:        Speech: Speech normal.        Behavior: Behavior normal.        Thought Content: Thought content normal.       Results for orders placed or performed in visit on 67/61/95  Basic Metabolic Panel  Result Value Ref Range   Glucose, Bld 113 (H) 65 - 99 mg/dL   BUN 50 (H) 7 - 25 mg/dL   Creat 1.91 (H) 0.70 - 1.28 mg/dL   BUN/Creatinine Ratio 26 (H) 6 - 22 (calc)   Sodium 140 135 - 146 mmol/L   Potassium 5.0 3.5 - 5.3 mmol/L   Chloride 108 98 - 110 mmol/L   CO2 20 20 - 32 mmol/L   Calcium 9.3 8.6 - 10.3 mg/dL     COVID 19 screen:  No recent travel or known exposure to COVID19 The patient denies respiratory symptoms of COVID 19 at this time. The importance of social distancing was discussed today.   Assessment and Plan The patient's preventative maintenance and recommended screening tests for an annual wellness exam were reviewed in full today. Brought up to date unless services declined.  Counselled on the importance of diet, exercise, and its role in overall health and mortality. The patient's FH and SH was reviewed, including their home life, tobacco status, and drug and alcohol status.    Stable PSA  in past.. not indicated after age 68 Colon: 11/2015 tubular adenoma, repeat 5 years.. plans to schedule Vaccines: up to date prevnar, pneumovax and Tdap, consider shingles.  Considering COVID vaccine Nonsmoker. HEP C: neg   Problem List Items Addressed This Visit  Essential hypertension (Chronic)    Stable, chronic.  Continue current medication.   There seems to be some discrepancy between my records/med list and Dr. Jacinto Reap.  He will review his medication at home and call us with an updated list.  For now it appears he is taking amlodipine 5 mg p.o. 2 tablets daily, lisinopril 20 versus 40 mg daily and possibly could be taking HCTZ.      Relevant Medications   aspirin EC 81 MG tablet   Hyperlipidemia with target LDL less than 70 (Chronic)    Chronic now LDL at goal on Repatha.  He is tolerating it well.      Relevant Medications   aspirin EC 81 MG tablet   CKD stage G2/A2, GFR 60-89 and albumin creatinine ratio 30-299 mg/g    Recent worsening with addition of temporary Lasix x2 days.  Prior to that had some slight decrease likely due to decreased p.o. intake.  Most recent creatinine trended back towards baseline.  Per patient Dr. Sharmaine Base will be reevaluating this in a few months.      Class 2 severe obesity with serious comorbidity and body mass index (BMI) of 39.0 to 39.9 in adult [facility])    Encouraged exercise, weight loss, healthy eating habits.       Hyperkalemia    Chronic, recurrent Likely secondary to ACE inhibitor, chronic kidney disease and multivitamin.  He will stop the multivitamin and avoid high potassium foods.      Other Visit Diagnoses     Routine general medical examination at a health care facility    -  Primary        Eliezer Lofts, MD

## 2021-10-16 NOTE — Assessment & Plan Note (Signed)
In past Statin induced muscle pain.Marland Kitchen doing well now on Repatha.

## 2021-10-16 NOTE — Assessment & Plan Note (Signed)
Stable, chronic.  Continue current medication.   There seems to be some discrepancy between my records/med list and Dr. Jacinto Reap.  He will review his medication at home and call us with an updated list.  For now it appears he is taking amlodipine 5 mg p.o. 2 tablets daily, lisinopril 20 versus 40 mg daily and possibly could be taking HCTZ.

## 2021-10-16 NOTE — Assessment & Plan Note (Signed)
Chronic, recurrent Likely secondary to ACE inhibitor, chronic kidney disease and multivitamin.  He will stop the multivitamin and avoid high potassium foods.

## 2021-10-27 ENCOUNTER — Telehealth: Payer: Self-pay | Admitting: Family Medicine

## 2021-10-27 DIAGNOSIS — E875 Hyperkalemia: Secondary | ICD-10-CM | POA: Diagnosis not present

## 2021-10-27 DIAGNOSIS — I10 Essential (primary) hypertension: Secondary | ICD-10-CM | POA: Diagnosis not present

## 2021-10-27 NOTE — Telephone Encounter (Signed)
error 

## 2021-11-04 DIAGNOSIS — I2510 Atherosclerotic heart disease of native coronary artery without angina pectoris: Secondary | ICD-10-CM | POA: Diagnosis not present

## 2021-11-11 DIAGNOSIS — I10 Essential (primary) hypertension: Secondary | ICD-10-CM | POA: Diagnosis not present

## 2022-04-13 DIAGNOSIS — N1831 Chronic kidney disease, stage 3a: Secondary | ICD-10-CM | POA: Diagnosis not present

## 2022-04-13 DIAGNOSIS — I10 Essential (primary) hypertension: Secondary | ICD-10-CM | POA: Diagnosis not present

## 2022-05-12 DIAGNOSIS — I2510 Atherosclerotic heart disease of native coronary artery without angina pectoris: Secondary | ICD-10-CM | POA: Diagnosis not present

## 2022-05-12 DIAGNOSIS — E785 Hyperlipidemia, unspecified: Secondary | ICD-10-CM | POA: Diagnosis not present

## 2022-05-12 DIAGNOSIS — I10 Essential (primary) hypertension: Secondary | ICD-10-CM | POA: Diagnosis not present

## 2022-06-13 ENCOUNTER — Emergency Department (HOSPITAL_BASED_OUTPATIENT_CLINIC_OR_DEPARTMENT_OTHER)
Admission: EM | Admit: 2022-06-13 | Discharge: 2022-06-13 | Disposition: A | Discharge status: Home / Self Care | Payer: Medicare Other | Attending: Emergency Medicine | Admitting: Emergency Medicine

## 2022-06-13 ENCOUNTER — Other Ambulatory Visit: Payer: Self-pay

## 2022-06-13 ENCOUNTER — Encounter (HOSPITAL_BASED_OUTPATIENT_CLINIC_OR_DEPARTMENT_OTHER): Payer: Self-pay

## 2022-06-13 ENCOUNTER — Emergency Department (HOSPITAL_BASED_OUTPATIENT_CLINIC_OR_DEPARTMENT_OTHER): Payer: Medicare Other

## 2022-06-13 DIAGNOSIS — L03211 Cellulitis of face: Secondary | ICD-10-CM | POA: Diagnosis not present

## 2022-06-13 DIAGNOSIS — Z7982 Long term (current) use of aspirin: Secondary | ICD-10-CM | POA: Diagnosis not present

## 2022-06-13 DIAGNOSIS — L089 Local infection of the skin and subcutaneous tissue, unspecified: Secondary | ICD-10-CM | POA: Diagnosis present

## 2022-06-13 DIAGNOSIS — Z79899 Other long term (current) drug therapy: Secondary | ICD-10-CM | POA: Diagnosis not present

## 2022-06-13 DIAGNOSIS — I779 Disorder of arteries and arterioles, unspecified: Secondary | ICD-10-CM | POA: Diagnosis not present

## 2022-06-13 LAB — CBC
HCT: 38.5 % — ABNORMAL LOW (ref 39.0–52.0)
Hemoglobin: 12.8 g/dL — ABNORMAL LOW (ref 13.0–17.0)
MCH: 31.4 pg (ref 26.0–34.0)
MCHC: 33.2 g/dL (ref 30.0–36.0)
MCV: 94.4 fL (ref 80.0–100.0)
Platelets: 165 10*3/uL (ref 150–400)
RBC: 4.08 MIL/uL — ABNORMAL LOW (ref 4.22–5.81)
RDW: 13 % (ref 11.5–15.5)
WBC: 4.6 10*3/uL (ref 4.0–10.5)
nRBC: 0 % (ref 0.0–0.2)

## 2022-06-13 LAB — COMPREHENSIVE METABOLIC PANEL
ALT: 12 U/L (ref 0–44)
AST: 19 U/L (ref 15–41)
Albumin: 4.1 g/dL (ref 3.5–5.0)
Alkaline Phosphatase: 70 U/L (ref 38–126)
Anion gap: 10 (ref 5–15)
BUN: 40 mg/dL — ABNORMAL HIGH (ref 8–23)
CO2: 20 mmol/L — ABNORMAL LOW (ref 22–32)
Calcium: 9.4 mg/dL (ref 8.9–10.3)
Chloride: 109 mmol/L (ref 98–111)
Creatinine, Ser: 1.41 mg/dL — ABNORMAL HIGH (ref 0.61–1.24)
GFR, Estimated: 52 mL/min — ABNORMAL LOW (ref >60)
Glucose, Bld: 93 mg/dL (ref 70–99)
Potassium: 5.4 mmol/L — ABNORMAL HIGH (ref 3.5–5.1)
Sodium: 139 mmol/L (ref 135–145)
Total Bilirubin: 0.4 mg/dL (ref 0.3–1.2)
Total Protein: 6.7 g/dL (ref 6.5–8.1)

## 2022-06-13 MED ORDER — IOHEXOL 300 MG/ML  SOLN
100.0000 mL | Freq: Once | INTRAMUSCULAR | Status: AC | PRN
  Administered 2022-06-13: 75 mL via INTRAVENOUS

## 2022-06-13 MED ORDER — DOXYCYCLINE HYCLATE 100 MG PO CAPS: 100.0000 mg | ORAL_CAPSULE | Freq: Two times a day (BID) | ORAL | 0 refills | Status: AC

## 2022-06-13 MED ORDER — DOXYCYCLINE HYCLATE 100 MG PO TABS
100.0000 mg | ORAL_TABLET | Freq: Once | ORAL | Status: DC
  Filled 2022-06-13: qty 1

## 2022-06-13 NOTE — Discharge Instructions (Signed)
It was a pleasure caring for you today. CT imaging was without concern for deep space infection. You are receiving a dose of antibiotics here in ED and I am sending the rest of your course of antibiotics to your pharmacy. Please take entire course of antibiotics even if symptoms resolve.  Seek emergency care if experiencing new of worsening symptoms.

## 2022-06-13 NOTE — ED Triage Notes (Signed)
Patient here POV from Home.  Endorses Wound to his [employer] that he believed to be [employer] but it has not subsided. Also notes an Area of Tenderness to his Right Facial Area that began at the same time 3 Days ago.  No Fevers. No Changes in Hearing.   NAD Noted during Triage. A&Ox4. GCS 15. Ambulatory.

## 2022-06-13 NOTE — ED Provider Notes (Signed)
[facility]ERGENCY DEPARTMENT AT [facility] Provider Note   CSN: [id number] Arrival date & time: 06/13/22  1105     History {Add pertinent medical, surgical, social history, OB history to HPI:1} Chief Complaint  Patient presents with   Wound Check    Joshua Stark is a 77 y.o. male who presents to ED complaining of skin infection. Patient was shaving 3-4 days ago when he cut his chin and had razor burn to right jaw area. Now complaining of feeling swelling around his right ear and extending into his throat.  Denies fever, nausea, chest pain, choking sensation, oral swelling, dyspnea, abdominal pain, hearing loss, rash.   Wound Check       Home Medications Prior to Admission medications   Medication Sig Start Date End Date Taking? Authorizing Provider  amLODipine (NORVASC) 5 MG tablet Take 2 tablets (10 mg total) by mouth daily. 08/26/18   Kathlen Mody, MD  aspirin EC 81 MG tablet Take 81 mg by mouth daily. Swallow whole.    [provider]  DEPAKOTE 250 MG DR tablet TAKE ONE TABLET BY MOUTH TWICE DAILY 08/20/21   Bedsole, Amy E, MD  Evolocumab with Infusor 420 MG/3.5ML SOCT Inject 3.5 mLs into the skin every 30 (thirty) days.  05/17/18   [provider]  Garlic 500 MG TABS Take 500 mg by mouth daily.    [provider]  hydrochlorothiazide (MICROZIDE) 12.5 MG capsule Take 12.5 mg by mouth daily.    [provider]  lisinopril (ZESTRIL) 20 MG tablet Take 10 mg by mouth daily. 02/19/19   [provider]  Multiple Vitamin (MULTIVITAMIN) capsule Take 1 capsule by mouth daily.     [provider]  NITROSTAT 0.4 MG SL tablet DISSOLVE 1 TABLET UNDER THE TONGUE FOR CHEST PAIN. MAY REPEAT EVERY UP TO 3 DOSES. IF NO RELIEF, CALL 911** 06/26/15   Marykay Lex, MD      Allergies    Fenofibrate, Rosuvastatin, Pravastatin sodium, and Statins    Review of Systems   Review of Systems  Physical Exam Updated Vital  Signs BP (!) 142/76 (BP Location: Right Arm)   Pulse (!) 56   Temp 97.9 F (36.6 C)   Resp 16   Ht 5\' 11"  (1.803 m)   Wt 118.8 kg   SpO2 99%   BMI 36.54 kg/m  Physical Exam Vitals and nursing note reviewed.  Constitutional:      General: He is not in acute distress.    Appearance: He is not ill-appearing or toxic-appearing.  HENT:     Head: Normocephalic and atraumatic.     Comments: Small 1cm scab on chin with mild surrounding erythema. No fluctuance. Skin anterior to right ear is mildly erythematous. No swelling or masses felt around ear or in throat. No oropharyngeal swelling appreciated.    Right Ear: Tympanic membrane, ear canal and external ear normal.     Left Ear: Tympanic membrane, ear canal and external ear normal.     Ears:     Comments: Right TM without erythema, swelling, or fluid    Mouth/Throat:     Mouth: Mucous membranes are moist.     Pharynx: Oropharynx is clear. No oropharyngeal exudate or posterior oropharyngeal erythema.  Eyes:     General: No scleral icterus.       Right eye: No discharge.        Left eye: No discharge.     Extraocular Movements: Extraocular movements intact.  Conjunctiva/sclera: Conjunctivae normal.  Cardiovascular:     Rate and Rhythm: Normal rate.     Pulses: Normal pulses.     Heart sounds: No murmur heard. Pulmonary:     Effort: Pulmonary effort is normal. No respiratory distress.     Breath sounds: No wheezing, rhonchi or rales.  Abdominal:     Tenderness: There is no abdominal tenderness.  Musculoskeletal:     Right lower leg: No edema.     Left lower leg: No edema.  Skin:    General: Skin is warm and dry.  Neurological:     General: No focal deficit present.     Mental Status: He is alert and oriented to person, place, and time. Mental status is at baseline.  Psychiatric:        Mood and Affect: Mood normal.     ED Results / Procedures / Treatments   Labs (all labs ordered are listed, but only abnormal results  are displayed) Labs Reviewed - No data to display  EKG None  Radiology No results found.  Procedures Procedures  {Document cardiac monitor, telemetry assessment procedure when appropriate:1}  Medications Ordered in ED Medications - No data to display  ED Course/ Medical Decision Making/ A&P   {   Click here for ABCD2, HEART and other calculatorsREFRESH Note before signing :1}                          Medical Decision Making Amount and/or Complexity of Data Reviewed Labs: ordered. Radiology: ordered.  Risk Prescription drug management.   This patient presents to the ED for concern of ***, this involves an extensive number of treatment options, and is a complaint that carries with it a high risk of complications and morbidity.  The differential diagnosis includes ***   Co morbidities that complicate the patient evaluation  ***   Additional history obtained:  Additional history obtained from *** External records from outside source obtained and reviewed including ***   Lab Tests:  I Ordered, and personally interpreted labs.  The pertinent results include:  ***   Imaging Studies ordered:  I ordered imaging studies including ***  I independently visualized and interpreted imaging which showed *** I agree with the radiologist interpretation   Cardiac Monitoring: / EKG:  The patient was maintained on a cardiac monitor.  I personally viewed and interpreted the cardiac monitored which showed an underlying rhythm of: ***   Consultations Obtained:  I requested consultation with the ***,  and discussed lab and imaging findings as well as pertinent plan - they recommend: ***   Problem List / ED Course / Critical interventions / Medication management  CT soft tissue without concern Sent patient with ABX I ordered medication including ***  for ***  Reevaluation of the patient after these medicines showed that the patient  {resolved/improved/worsened:23923::"improved"} I have reviewed the patients home medicines and have made adjustments as needed   Social Determinants of Health:  ***   Test / Admission - Considered:  ***   {Document critical care time when appropriate:1} {Document review of labs and clinical decision tools ie heart score, Chads2Vasc2 etc:1}  {Document your independent review of radiology images, and any outside records:1} {Document your discussion with family members, caretakers, and with consultants:1} {Document social determinants of health affecting pt's care:1} {Document your decision making why or why not admission, treatments were needed:1} Final Clinical Impression(s) / ED Diagnoses Final diagnoses:  None  Rx / DC Orders ED Discharge Orders     None

## 2022-06-23 ENCOUNTER — Telehealth: Payer: Self-pay

## 2022-06-23 NOTE — Telephone Encounter (Signed)
Transition Care Management Unsuccessful Follow-up Telephone Call  Date of discharge and from where:  [facility] 5/19  Attempts:  1st Attempt  Reason for unsuccessful TCM follow-up call:  Unable to leave a message   Lenard Forth [facility] Guide, [facility] [phone] [address] [place], [place], [place] [address] Phone: [phone] Email: [email or web address]

## 2022-06-24 ENCOUNTER — Telehealth: Payer: Self-pay

## 2022-06-24 NOTE — Telephone Encounter (Signed)
Transition Care Management Unsuccessful Follow-up Telephone Call  Date of discharge and from where:  [facility] 5/19  Attempts:  2nd Attempt  Reason for unsuccessful TCM follow-up call:  Left voice message   Lenard Forth [facility] Guide, [facility] [phone] [address] [place], [place], [place] [address] Phone: [phone] Email: [email or web address]

## 2022-07-01 DIAGNOSIS — D369 Benign neoplasm, unspecified site: Secondary | ICD-10-CM | POA: Diagnosis not present

## 2022-07-01 DIAGNOSIS — E875 Hyperkalemia: Secondary | ICD-10-CM | POA: Diagnosis not present

## 2022-07-01 DIAGNOSIS — I2510 Atherosclerotic heart disease of native coronary artery without angina pectoris: Secondary | ICD-10-CM | POA: Diagnosis not present

## 2022-07-01 DIAGNOSIS — I129 Hypertensive chronic kidney disease with stage 1 through stage 4 chronic kidney disease, or unspecified chronic kidney disease: Secondary | ICD-10-CM | POA: Diagnosis not present

## 2022-07-01 DIAGNOSIS — Z1211 Encounter for screening for malignant neoplasm of colon: Secondary | ICD-10-CM | POA: Diagnosis not present

## 2022-07-01 DIAGNOSIS — N1831 Chronic kidney disease, stage 3a: Secondary | ICD-10-CM | POA: Diagnosis not present

## 2022-07-27 ENCOUNTER — Telehealth: Payer: Self-pay

## 2022-07-27 NOTE — Telephone Encounter (Signed)
Unsuccessful attempt to reach patient on preferred number listed in notes for scheduled AWV. Left message on voicemail okay to reschedule. 

## 2022-08-17 ENCOUNTER — Other Ambulatory Visit: Payer: Self-pay | Admitting: Family Medicine

## 2022-08-17 NOTE — Telephone Encounter (Signed)
Spoke to pt, scheduled cpe for 10/21/22

## 2022-08-17 NOTE — Telephone Encounter (Signed)
Please schedule CPE with fasting labs prior with Dr. Ermalene Searing after 10/17/22.  Already has MWV scheduled with nurse.   Last office visit 10/16/21 for CPE.  Last refilled 08/20/21 for #180 with 3 refills.  Next Appt:  No future appointments with PCP.

## 2022-09-09 ENCOUNTER — Encounter (INDEPENDENT_AMBULATORY_CARE_PROVIDER_SITE_OTHER): Payer: Self-pay

## 2022-09-29 ENCOUNTER — Ambulatory Visit (INDEPENDENT_AMBULATORY_CARE_PROVIDER_SITE_OTHER): Payer: Medicare Other

## 2022-09-29 VITALS — Ht 71.0 in | Wt 258.0 lb

## 2022-09-29 DIAGNOSIS — Z0000 Encounter for general adult medical examination without abnormal findings: Secondary | ICD-10-CM | POA: Diagnosis not present

## 2022-09-29 NOTE — Patient Instructions (Signed)
Joshua Stark , Thank you for taking time to come for your Medicare Wellness Visit. I appreciate your ongoing commitment to your health goals. Please review the following plan we discussed and let me know if I can assist you in the future.   Referrals/Orders/Follow-Ups/Clinician Recommendations: Aim for 30 minutes of exercise or brisk walking, 6-8 glasses of water, and 5 servings of fruits and vegetables each day.   This is a list of the screening recommended for you and due dates:  Health Maintenance  Topic Date Due   COVID-19 Vaccine (1) Never done   Zoster (Shingles) Vaccine (1 of 2) Never done   Colon Cancer Screening  12/10/2020   Medicare Annual Wellness Visit  07/22/2022   Flu Shot  08/26/2022   DTaP/Tdap/Td vaccine (3 - Td or Tdap) 11/02/2023   Pneumonia Vaccine  Completed   Hepatitis C Screening  Completed   HPV Vaccine  Aged Out    Advanced directives: (Copy Requested) Please bring a copy of your health care power of attorney and living will to the office to be added to your chart at your convenience.  Next Medicare Annual Wellness Visit scheduled for next year: Yes

## 2022-09-29 NOTE — Progress Notes (Signed)
Subjective:   Joshua Stark is a 77 y.o. male who presents for Medicare Annual/Subsequent preventive examination.  Visit Complete: Virtual  I connected with  Joshua Stark on 09/29/22 by a audio enabled telemedicine application and verified that I am speaking with the correct person using two identifiers.  Patient Location: Home  Provider Location: Office/Clinic  I discussed the limitations of evaluation and management by telemedicine. The patient expressed understanding and agreed to proceed.  Vital Signs: Because this visit was a virtual/telehealth visit, some criteria may be missing or patient reported. Any vitals not documented were not able to be obtained and vitals that have been documented are patient reported.    Review of Systems      Cardiac Risk Factors include: advanced age (>43men, >36 women);hypertension;sedentary lifestyle;male gender;dyslipidemia     Objective:    Today's Vitals   09/29/22 1017  Weight: 258 lb (117 kg)  Height: 5\' 11"  (1.803 m)   Body mass index is 35.98 kg/m.     09/29/2022   10:25 AM 06/13/2022   11:44 AM 07/21/2021    9:03 AM 07/17/2020    2:54 PM 07/04/2019    2:45 PM 08/23/2018    4:50 PM 08/23/2018    1:15 AM  Advanced Directives  Does Patient Have a Medical Advance Directive? Yes No Yes Yes Yes Yes Yes  Type of Estate agent of [place];Living will  Healthcare Power of [place];Living will Healthcare Power of [place];Living will Healthcare Power of [place];Living will Living will Living will  Does patient want to make changes to medical advance directive?   No - Patient declined   No - Patient declined No - Patient declined  Copy of Healthcare Power of Attorney in Chart? No - copy requested  No - copy requested No - copy requested No - copy requested    Would patient like information on creating a medical advance directive?  No - Patient declined    No - Patient declined No - Patient declined    Current  Medications (verified) Outpatient Encounter Medications as of 09/29/2022  Medication Sig   amLODipine (NORVASC) 5 MG tablet Take 2 tablets (10 mg total) by mouth daily.   Ascorbic Acid (VITAMIN C) 500 MG CAPS Take 1,000 mg by mouth.   aspirin EC 81 MG tablet Take 81 mg by mouth daily. Swallow whole.   cloNIDine (CATAPRES) 0.1 MG tablet Take 0.1 mg by mouth daily.   DEPAKOTE 250 MG DR tablet Take 1 tablet (250 mg total) by mouth 2 (two) times daily.   Evolocumab with Infusor 420 MG/3.5ML SOCT Inject 3.5 mLs into the skin every 30 (thirty) days.    Garlic 500 MG TABS Take 500 mg by mouth daily.   hydrochlorothiazide (MICROZIDE) 12.5 MG capsule Take 12.5 mg by mouth daily.   Multiple Vitamin (MULTIVITAMIN) capsule Take 1 capsule by mouth daily.    NITROSTAT 0.4 MG SL tablet DISSOLVE 1 TABLET UNDER THE TONGUE FOR CHEST PAIN. MAY REPEAT EVERY UP TO 3 DOSES. IF NO RELIEF, CALL 911**   lisinopril (ZESTRIL) 20 MG tablet Take 10 mg by mouth daily. (Patient not taking: Reported on 09/29/2022)   No facility-administered encounter medications on file as of 09/29/2022.    Allergies (verified) Fenofibrate, Rosuvastatin, Pravastatin sodium, and Statins   History: Past Medical History:  Diagnosis Date   Arthritis    CAD S/P percutaneous coronary angioplasty 1999   PCI- prox LAD @ D1, SP1 trifurcation.  3.0 mm x  16 mm AVE-GFX BMS   History of nuclear stress test August 2012   Treadmill Myoview: 9 minutes, 10 METS --> hypertensive response.  No evidence of ischemia or infarction.  EF 64%   HLD (hyperlipidemia)    Hypertension    Obesity (BMI 30-39.9)    Seizure disorder (HCC)    Seizures (HCC)    hx of years ago    Past Surgical History:  Procedure Laterality Date   CARDIAC CATHETERIZATION  01/20/1998   CORONARY ANGIOPLASTY  01/20/1998   PCI to LAD ,prox LAD at trifurcation for D1and SP1.3.0-x16-mm AVE-GFX bare-metal stent   FEMUR IM NAIL Left 08/23/2018   Procedure: INTRAMEDULLARY (IM)  RETROGRADE FEMORAL NAILING;  Surgeon: Durene Romans, MD;  Location: [facility];  Service: Orthopedics;  Laterality: Left;   NM MYOCAR PERF WALL MOTION  Aug 2 ,2012   treadmill myoview exercised 9 minutes, reacing 10 metaboblic equivlents,had ahypertensive reponse .EF64%   TOTAL KNEE ARTHROPLASTY Left 08/17/2016   Procedure: LEFT TOTAL KNEE ARTHROPLASTY;  Surgeon: Durene Romans, MD;  Location: [facility];  Service: Orthopedics;  Laterality: Left;   TRANSTHORACIC ECHOCARDIOGRAM  09/20/2013   normal LV size and function. EF 60-65%. Normal diastolic function. Mild LA dilation and mild AI.  No evidence of aortic stenosis. No significant MR.   Family History  Problem Relation Age of Onset   Hypertension Mother    Cancer Father        lung and prostate   Social History   Socioeconomic History   Marital status: Married    Spouse name: Not on file   Number of children: 2   Years of education: Not on file   Highest education level: Not on file  Occupational History   Not on file  Tobacco Use   Smoking status: Never   Smokeless tobacco: Never  Vaping Use   Vaping status: Never Used  Substance and Sexual Activity   Alcohol use: No    Alcohol/week: 0.0 standard drinks of alcohol   Drug use: No   Sexual activity: Not on file  Other Topics Concern   Not on file  Social History Narrative   He is a married father of 2, grandfather 4.   He usually walks 5 days a week.   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  He does a lot of activity and strenuous labor with this as well.   He never smoked, and does not drink alcohol.   Social Determinants of Health   Financial Resource Strain: Low Risk  (09/29/2022)   Overall Financial Resource Strain (CARDIA)    Difficulty of Paying Living Expenses: Not hard at all  Food Insecurity: No Food Insecurity (09/29/2022)   Hunger Vital Sign    Worried About Running Out of Food in the Last Year: Never true    Ran Out of Food in the Last  Year: Never true  Transportation Needs: No Transportation Needs (09/29/2022)   PRAPARE - Administrator, Civil Service (Medical): No    Lack of Transportation (Non-Medical): No  Physical Activity: Insufficiently Active (09/29/2022)   Exercise Vital Sign    Days of Exercise per Week: 3 days    Minutes of Exercise per Session: 10 min  Stress: No Stress Concern Present (09/29/2022)   [employer] of Occupational Health - Occupational Stress Questionnaire    Feeling of Stress : Not at all  Social Connections: Moderately Integrated (09/29/2022)   Social Connection and Isolation Panel [  NHANES]    Frequency of Communication with Friends and Family: More than three times a week    Frequency of Social Gatherings with Friends and Family: More than three times a week    Attends Religious Services: More than 4 times per year    Active Member of [employer] or Organizations: No    Attends Engineer, structural: Never    Marital Status: Married    Tobacco Counseling Counseling given: Not Answered   Clinical Intake:  Pre-visit preparation completed: Yes  Pain : No/denies pain     BMI - recorded: 35.98 Nutritional Status: BMI > 30  Obese Nutritional Risks: None Diabetes: No  How often do you need to have someone help you when you read instructions, pamphlets, or other written materials from your doctor or pharmacy?: 1 - Never  Interpreter Needed?: No  Information entered by :: C.Farris Geiman LPN   Activities of Daily Living    09/29/2022   10:26 AM  In your present state of health, do you have any difficulty performing the following activities:  Hearing? 0  Vision? 0  Difficulty concentrating or making decisions? 1  Comment occasionally  Walking or climbing stairs? 0  Dressing or bathing? 0  Doing errands, shopping? 0  Preparing Food and eating ? N  Using the Toilet? N  In the past six months, have you accidently leaked urine? N  Do you have problems with loss of  bowel control? N  Managing your Medications? N  Managing your Finances? N  Housekeeping or managing your Housekeeping? N    Patient Care Team: Excell Seltzer, MD as PCP - General  Indicate any recent Medical Services you may have received from other than [facility] providers in the past year (date may be approximate).     Assessment:   This is a routine wellness examination for Joshua Stark.  Hearing/Vision screen Hearing Screening - Comments:: Denies hearing difficulties   Vision Screening - Comments:: No vision issues - [facility] - UTD on eye exams  Dietary issues and exercise activities discussed:     Goals Addressed             This Visit's Progress    Patient Stated       Lose 50lbs       Depression Screen    09/29/2022   10:19 AM 07/21/2021    8:57 AM 07/17/2020    2:56 PM 07/04/2019    2:49 PM 06/28/2018   11:28 AM 11/03/2016    9:42 AM 05/03/2014    9:35 AM  PHQ 2/9 Scores  PHQ - 2 Score 0 0 0 0 0 0 0  PHQ- 9 Score   0 0 0 0     Fall Risk    09/29/2022   10:21 AM 07/21/2021    9:00 AM 07/17/2020    2:55 PM 07/04/2019    2:46 PM 06/28/2018   11:28 AM  Fall Risk   Falls in the past year? 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 0 0 0 1   Comment  No injuries or medical attention needed  broke leg   Risk for fall due to : No Fall Risks No Fall Risks Medication side effect Medication side effect   Follow up Falls prevention discussed;Falls evaluation completed  Falls evaluation completed;Falls prevention discussed Falls evaluation completed;Falls prevention discussed  MEDICARE RISK AT HOME: Medicare Risk at Home Any stairs in or around the home?: Yes If so, are there any without handrails?: No Home free of loose throw rugs in walkways, pet beds, electrical cords, etc?: Yes Adequate lighting in your home to reduce risk of falls?: Yes Life alert?: No Use of a cane, walker or w/c?: No Grab bars in  the bathroom?: Yes Shower chair or bench in shower?: Yes Elevated toilet seat or a handicapped toilet?: Yes  TIMED UP AND GO:  Was the test performed?  No    Cognitive Function:    07/17/2020    2:57 PM 07/04/2019    2:54 PM 06/28/2018   11:28 AM 11/03/2016    9:42 AM  MMSE - Mini Mental State Exam  Not completed: Refused     Orientation to time  5 5 5   Orientation to Place  5 5 5   Registration  3 3 3   Attention/ Calculation  5 0 0  Recall  3 3 3   Language- name 2 objects   0 0  Language- repeat  1 1 1   Language- follow 3 step command   0 3  Language- read & follow direction   0 0  Write a sentence   0 0  Copy design   0 0  Total score   17 20        09/29/2022   10:28 AM 07/21/2021    9:03 AM  6CIT Screen  What Year? 0 points 0 points  What month? 0 points 0 points  What time? 0 points 0 points  Count back from 20 0 points 0 points  Months in reverse 0 points 0 points  Repeat phrase 0 points 0 points  Total Score 0 points 0 points    Immunizations Immunization History  Administered Date(s) Administered   Influenza Split 11/26/2010, 11/12/2011   Influenza Whole 10/25/2008, 10/21/2009   Influenza, High Dose Seasonal PF 10/25/2018   Influenza,inj,Quad PF,6+ Mos 10/31/2012, 11/01/2013, 11/05/2014, 11/11/2015, 11/23/2016   Influenza-Unspecified 12/14/2017   Pneumococcal Conjugate-13 11/01/2013   Pneumococcal Polysaccharide-23 11/05/2014   Td 08/06/2003   Tdap 11/01/2013    TDAP status: Up to date  Flu Vaccine status: Due, Education has been provided regarding the importance of this vaccine. Advised may receive this vaccine at local pharmacy or Health Dept. Aware to provide a copy of the vaccination record if obtained from local pharmacy or Health Dept. Verbalized acceptance and understanding.  Pneumococcal vaccine status: Up to date  Covid-19 vaccine status: Information provided on how to obtain vaccines.   Qualifies for Shingles Vaccine? Yes   Zostavax  completed No   Shingrix Completed?: No.    Education has been provided regarding the importance of this vaccine. Patient has been advised to call insurance company to determine out of pocket expense if they have not yet received this vaccine. Advised may also receive vaccine at local pharmacy or Health Dept. Verbalized acceptance and understanding.  Screening Tests Health Maintenance  Topic Date Due   COVID-19 Vaccine (1) Never done   Zoster Vaccines- Shingrix (1 of 2) Never done   Colonoscopy  12/10/2020   INFLUENZA VACCINE  08/26/2022   Medicare Annual Wellness (AWV)  09/29/2023   DTaP/Tdap/Td (3 - Td or Tdap) 11/02/2023   Pneumonia Vaccine 76+ Years old  Completed   Hepatitis C Screening  Completed   HPV VACCINES  Aged Out    Health Maintenance  Health Maintenance Due  Topic Date Due  COVID-19 Vaccine (1) Never done   Zoster Vaccines- Shingrix (1 of 2) Never done   Colonoscopy  12/10/2020   INFLUENZA VACCINE  08/26/2022    Colorectal cancer screening: Type of screening: Colonoscopy. Completed 12/11/15. Repeat every 5 years Pt has colonoscopy scheduled for 12/29/22.  Lung Cancer Screening: (Low Dose CT Chest recommended if Age 39-80 years, 20 pack-year currently smoking OR have quit w/in 15years.) does not qualify.   Lung Cancer Screening Referral:    Additional Screening:  Hepatitis C Screening: does qualify; Completed 11/05/14  Vision Screening: Recommended annual ophthalmology exams for early detection of glaucoma and other disorders of the eye. Is the patient up to date with their annual eye exam?  Yes  Who is the provider or what is the name of the office in which the patient attends annual eye exams? [facility] If pt is not established with a provider, would they like to be referred to a provider to establish care? No .   Dental Screening: Recommended annual dental exams for proper oral hygiene  Diabetic Foot Exam:   Community Resource Referral / Chronic Care  Management: CRR required this visit?  No   CCM required this visit?  No     Plan:     I have personally reviewed and noted the following in the patient's chart:   Medical and social history Use of alcohol, tobacco or illicit drugs  Current medications and supplements including opioid prescriptions. Patient is not currently taking opioid prescriptions. Functional ability and status Nutritional status Physical activity Advanced directives List of other physicians Hospitalizations, surgeries, and ER visits in previous 12 months Vitals Screenings to include cognitive, depression, and falls Referrals and appointments  In addition, I have reviewed and discussed with patient certain preventive protocols, quality metrics, and best practice recommendations. A written personalized care plan for preventive services as well as general preventive health recommendations were provided to patient.     Maryan Puls, LPN   03/06/5619   After Visit Summary: (MyChart) Due to this being a telephonic visit, the after visit summary with patients personalized plan was offered to patient via MyChart   Nurse Notes: Vaccinations: declines  Influenza vaccine: recommend every Fall Shingles vaccine: recommend Shingrix which is 2 doses 2-6 months apart and over 90% effective     Covid-19: recommend 2 doses one month apart with a booster 6 months later

## 2022-09-30 ENCOUNTER — Telehealth: Payer: Self-pay | Admitting: *Deleted

## 2022-09-30 DIAGNOSIS — E785 Hyperlipidemia, unspecified: Secondary | ICD-10-CM

## 2022-09-30 DIAGNOSIS — R7303 Prediabetes: Secondary | ICD-10-CM

## 2022-09-30 NOTE — Telephone Encounter (Signed)
-----   Message from Lovena Neighbours sent at 09/29/2022  3:36 PM EDT ----- Regarding: Labs for Thursday 9.19.24 Please put lab orders in future. Thank you, Denny Peon

## 2022-09-30 NOTE — Telephone Encounter (Signed)
Do we still need to check Valproic Acid level since patient in on Depakote?  Also last A1c was 6.1 which fall in prediabetes range.  Ok to use prediabetes for Dx?  No on problem list?

## 2022-10-04 DIAGNOSIS — Z96652 Presence of left artificial knee joint: Secondary | ICD-10-CM | POA: Diagnosis not present

## 2022-10-04 DIAGNOSIS — M1711 Unilateral primary osteoarthritis, right knee: Secondary | ICD-10-CM | POA: Diagnosis not present

## 2022-10-04 DIAGNOSIS — M25561 Pain in right knee: Secondary | ICD-10-CM | POA: Diagnosis not present

## 2022-10-14 ENCOUNTER — Other Ambulatory Visit (INDEPENDENT_AMBULATORY_CARE_PROVIDER_SITE_OTHER): Payer: Medicare Other

## 2022-10-14 DIAGNOSIS — R7303 Prediabetes: Secondary | ICD-10-CM

## 2022-10-14 DIAGNOSIS — E785 Hyperlipidemia, unspecified: Secondary | ICD-10-CM | POA: Diagnosis not present

## 2022-10-14 LAB — COMPREHENSIVE METABOLIC PANEL
ALT: 17 U/L (ref 0–53)
AST: 19 U/L (ref 0–37)
Albumin: 4.1 g/dL (ref 3.5–5.2)
Alkaline Phosphatase: 73 U/L (ref 39–117)
BUN: 51 mg/dL — ABNORMAL HIGH (ref 6–23)
CO2: 23 mEq/L (ref 19–32)
Calcium: 9.8 mg/dL (ref 8.4–10.5)
Chloride: 108 mEq/L (ref 96–112)
Creatinine, Ser: 1.8 mg/dL — ABNORMAL HIGH (ref 0.40–1.50)
GFR: 36.28 mL/min — ABNORMAL LOW (ref >60.00)
Glucose, Bld: 108 mg/dL — ABNORMAL HIGH (ref 70–99)
Potassium: 5.1 mEq/L (ref 3.5–5.1)
Sodium: 140 mEq/L (ref 135–145)
Total Bilirubin: 0.4 mg/dL (ref 0.2–1.2)
Total Protein: 6.7 g/dL (ref 6.0–8.3)

## 2022-10-14 LAB — LIPID PANEL
Cholesterol: 158 mg/dL (ref 0–200)
HDL: 47.3 mg/dL (ref >39.00)
LDL Cholesterol: 84 mg/dL (ref 0–99)
NonHDL: 110.9
Total CHOL/HDL Ratio: 3
Triglycerides: 137 mg/dL (ref 0.0–149.0)
VLDL: 27.4 mg/dL (ref 0.0–40.0)

## 2022-10-14 LAB — HEMOGLOBIN A1C: Hgb A1c MFr Bld: 5.8 % (ref 4.6–6.5)

## 2022-10-14 NOTE — Progress Notes (Signed)
No critical labs need to be addressed urgently. We will discuss labs in detail at upcoming office visit.   

## 2022-10-21 ENCOUNTER — Encounter: Payer: Medicare Other | Admitting: Family Medicine

## 2022-10-22 ENCOUNTER — Encounter: Payer: Medicare Other | Admitting: Family Medicine

## 2022-10-27 DIAGNOSIS — M25561 Pain in right knee: Secondary | ICD-10-CM | POA: Diagnosis not present

## 2022-10-27 DIAGNOSIS — M1711 Unilateral primary osteoarthritis, right knee: Secondary | ICD-10-CM | POA: Diagnosis not present

## 2022-10-28 ENCOUNTER — Ambulatory Visit: Payer: Medicare Other | Admitting: Family Medicine

## 2022-10-28 VITALS — BP 130/62 | HR 54 | Temp 98.1°F | Ht 71.0 in | Wt 268.2 lb

## 2022-10-28 DIAGNOSIS — Z6839 Body mass index (BMI) 39.0-39.9, adult: Secondary | ICD-10-CM

## 2022-10-28 DIAGNOSIS — Z0000 Encounter for general adult medical examination without abnormal findings: Secondary | ICD-10-CM | POA: Diagnosis not present

## 2022-10-28 DIAGNOSIS — E785 Hyperlipidemia, unspecified: Secondary | ICD-10-CM | POA: Diagnosis not present

## 2022-10-28 DIAGNOSIS — E66812 Obesity, class 2: Secondary | ICD-10-CM

## 2022-10-28 DIAGNOSIS — N182 Chronic kidney disease, stage 2 (mild): Secondary | ICD-10-CM | POA: Diagnosis not present

## 2022-10-28 DIAGNOSIS — I10 Essential (primary) hypertension: Secondary | ICD-10-CM | POA: Diagnosis not present

## 2022-10-28 DIAGNOSIS — G40909 Epilepsy, unspecified, not intractable, without status epilepticus: Secondary | ICD-10-CM | POA: Diagnosis not present

## 2022-10-28 NOTE — Assessment & Plan Note (Signed)
Chronic,  fairly stable control fluctuations at baseline

## 2022-10-28 NOTE — Assessment & Plan Note (Signed)
Chronic now LDL at goal on Repatha.  He is tolerating it well.

## 2022-10-28 NOTE — Assessment & Plan Note (Signed)
Stable, chronic.  Continue current medication.    On depakote 250 mg BID

## 2022-10-28 NOTE — Assessment & Plan Note (Addendum)
Stable, chronic.  Continue current medication.   amlodipine 5 mg p.o. 2 tablets daily,hydrochlorothiazide 12.5 mg daily, catapres  tablet 0.1 mg daily   No longer on lisinopril

## 2022-10-28 NOTE — Assessment & Plan Note (Signed)
Encouraged exercise, weight loss, healthy eating habits. ? ?

## 2022-10-28 NOTE — Progress Notes (Signed)
Patient ID: Joshua Stark, male    DOB: 1945/03/26, 76 y.o.   MRN: [id number]  This visit was conducted in person.  BP 130/62 (BP Location: Left Arm, Patient Position: Sitting, Cuff Size: Normal)   Pulse (!) 54   Temp 98.1 F (36.7 C) (Oral)   Ht 5\' 11"  (1.803 m)   Wt 268 lb 3.2 oz (121.7 kg)   SpO2 95%   BMI 37.41 kg/m    CC:  Chief Complaint  Patient presents with   Annual Exam    Patient states no other concerns    Subjective:   HPI: Joshua Stark is a 76 y.o. male presenting on 10/28/2022 for Annual Exam (Patient states no other concerns)  The patient presents for complete physical and review of chronic health problems. He/She also has the following acute concerns today:  The patient saw a LPN or RN for medicare wellness visit. 09/29/2022  Prevention and wellness was reviewed in detail. Note reviewed and important notes copied below.    Upcoming TKR on right.. Dr Cornelius Moras. Nov 19.   Hypertension:   Good  control in office today on amlodipine  10mg  p.o. daily BP Readings from Last 3 Encounters:  10/28/22 130/62  06/13/22 (!) 169/76  10/16/21 (!) 140/72  Using medication without problems or lightheadedness:  none Chest pain with exertion: none Edema:none Short of breath:  occ Average home BPs: Other issues:    Elevated Cholesterol: LDL less than 70  Tried pravachol, lipitor, crestor. Has statin intolerane. Now on Repatha. Lab Results  Component Value Date   CHOL 158 10/14/2022   HDL 47.30 10/14/2022   LDLCALC 84 10/14/2022   TRIG 137.0 10/14/2022   CHOLHDL 3 10/14/2022  Using medications without problems: none Muscle aches:  none  Diet compliance: hear healthy diet Exercise: staying active Other complaints:  CKD..  stable control..  Wt Readings from Last 3 Encounters:  10/28/22 268 lb 3.2 oz (121.7 kg)  09/29/22 258 lb (117 kg)  06/13/22 262 lb (118.8 kg)   Body mass index is 37.41 kg/m.   Hx of seizures: Stable, remote on depakote.    New  cardiologist.. Dr. Si Raider  Relevant past medical, surgical, family and social history reviewed and updated as indicated. Interim medical history since our last visit reviewed. Allergies and medications reviewed and updated. Outpatient Medications Prior to Visit  Medication Sig Dispense Refill   amLODipine (NORVASC) 5 MG tablet Take 2 tablets (10 mg total) by mouth daily. 30 tablet 10   Ascorbic Acid (VITAMIN C) 500 MG CAPS Take 1,000 mg by mouth.     aspirin EC 81 MG tablet Take 81 mg by mouth daily. Swallow whole.     cloNIDine (CATAPRES) 0.1 MG tablet Take 0.1 mg by mouth daily.     DEPAKOTE 250 MG DR tablet Take 1 tablet (250 mg total) by mouth 2 (two) times daily. 180 tablet 0   Evolocumab with Infusor 420 MG/3.5ML SOCT Inject 3.5 mLs into the skin every 30 (thirty) days.      Garlic 500 MG TABS Take 500 mg by mouth daily.     hydrochlorothiazide (MICROZIDE) 12.5 MG capsule Take 12.5 mg by mouth daily.     Multiple Vitamin (MULTIVITAMIN) capsule Take 1 capsule by mouth daily.      NITROSTAT 0.4 MG SL tablet DISSOLVE 1 TABLET UNDER THE TONGUE FOR CHEST PAIN. MAY REPEAT EVERY UP TO 3 DOSES. IF NO RELIEF, CALL 911** 25 tablet 2  lisinopril (ZESTRIL) 20 MG tablet Take 10 mg by mouth daily. (Patient not taking: Reported on 09/29/2022)     No facility-administered medications prior to visit.     Per HPI unless specifically indicated in ROS section below Review of Systems  Constitutional:  Negative for fatigue and fever.  HENT:  Negative for ear pain.   Eyes:  Negative for pain.  Respiratory:  Negative for cough and shortness of breath.   Cardiovascular:  Negative for chest pain, palpitations and leg swelling.  Gastrointestinal:  Negative for abdominal pain.  Genitourinary:  Negative for dysuria.  Musculoskeletal:  Negative for arthralgias.  Neurological:  Negative for syncope, light-headedness and headaches.  Psychiatric/Behavioral:  Negative for dysphoric mood.    Objective:   BP 130/62 (BP Location: Left Arm, Patient Position: Sitting, Cuff Size: Normal)   Pulse (!) 54   Temp 98.1 F (36.7 C) (Oral)   Ht 5\' 11"  (1.803 m)   Wt 268 lb 3.2 oz (121.7 kg)   SpO2 95%   BMI 37.41 kg/m   Wt Readings from Last 3 Encounters:  10/28/22 268 lb 3.2 oz (121.7 kg)  09/29/22 258 lb (117 kg)  06/13/22 262 lb (118.8 kg)      Physical Exam Constitutional:      Appearance: He is well-developed. He is obese.  HENT:     Head: Normocephalic.     Right Ear: Hearing normal.     Left Ear: Hearing normal.     Nose: Nose normal.  Neck:     Thyroid: No thyroid mass or thyromegaly.     Vascular: No carotid bruit.     Trachea: Trachea normal.  Cardiovascular:     Rate and Rhythm: Normal rate and regular rhythm.     Pulses: Normal pulses.     Heart sounds: Heart sounds not distant. No murmur heard.    No friction rub. No gallop.     Comments: No peripheral edema Pulmonary:     Effort: Pulmonary effort is normal. No respiratory distress.     Breath sounds: Normal breath sounds.  Skin:    General: Skin is warm and dry.     Findings: No rash.  Psychiatric:        Speech: Speech normal.        Behavior: Behavior normal.        Thought Content: Thought content normal.       Results for orders placed or performed in visit on 10/14/22  Hemoglobin A1c  Result Value Ref Range   Hgb A1c MFr Bld 5.8 4.6 - 6.5 %  Lipid panel  Result Value Ref Range   Cholesterol 158 0 - 200 mg/dL   Triglycerides 914.7 0.0 - 149.0 mg/dL   HDL 82.95 >62.13 mg/dL   VLDL 08.6 0.0 - 57.8 mg/dL   LDL Cholesterol 84 0 - 99 mg/dL   Total CHOL/HDL Ratio 3    NonHDL 110.90   Comprehensive metabolic panel  Result Value Ref Range   Sodium 140 135 - 145 mEq/L   Potassium 5.1 3.5 - 5.1 mEq/L   Chloride 108 96 - 112 mEq/L   CO2 23 19 - 32 mEq/L   Glucose, Bld 108 (H) 70 - 99 mg/dL   BUN 51 (H) 6 - 23 mg/dL   Creatinine, Ser 4.69 (H) 0.40 - 1.50 mg/dL   Total Bilirubin 0.4 0.2 - 1.2 mg/dL    Alkaline Phosphatase 73 39 - 117 U/L   AST 19 0 - 37 U/L  ALT 17 0 - 53 U/L   Total Protein 6.7 6.0 - 8.3 g/dL   Albumin 4.1 3.5 - 5.2 g/dL   GFR 16.10 (L) >96.04 mL/min   Calcium 9.8 8.4 - 10.5 mg/dL     COVID 19 screen:  No recent travel or known exposure to COVID19 The patient denies respiratory symptoms of COVID 19 at this time. The importance of social distancing was discussed today.   Assessment and Plan The patient's preventative maintenance and recommended screening tests for an annual wellness exam were reviewed in full today. Brought up to date unless services declined.  Counselled on the importance of diet, exercise, and its role in overall health and mortality. The patient's FH and SH was reviewed, including their home life, tobacco status, and drug and alcohol status.    Stable PSA in past.. not indicated after age 54 Colon: 11/2015 tubular adenoma, repeat 5 years.. plans  03/2023 Vaccines: up to date prevnar, pneumovax and Tdap, consider shingles.   refused flu and  COVID vaccine Nonsmoker. HEP C: neg   Problem List Items Addressed This Visit     CKD stage G2/A2, GFR 60-89 and albumin creatinine ratio 30-299 mg/g     Chronic,  fairly stable control fluctuations at baseline      Class 2 severe obesity with serious comorbidity and body mass index (BMI) of 39.0 to 39.9 in adult [facility])    Encouraged exercise, weight loss, healthy eating habits.       Epileptic seizures (HCC)    Stable, chronic.  Continue current medication.    On depakote 250 mg BID      Essential hypertension (Chronic)    Stable, chronic.  Continue current medication.   amlodipine 5 mg p.o. 2 tablets daily,hydrochlorothiazide 12.5 mg daily, catapres  tablet 0.1 mg daily   No longer on lisinopril      Hyperlipidemia with target LDL less than 70 (Chronic)    Chronic now LDL at goal on Repatha.  He is tolerating it well.      Other Visit Diagnoses     Routine general medical  examination at a health care facility    -  Primary         Kerby Nora, MD

## 2022-11-03 DIAGNOSIS — R7303 Prediabetes: Secondary | ICD-10-CM | POA: Diagnosis not present

## 2022-11-03 DIAGNOSIS — I10 Essential (primary) hypertension: Secondary | ICD-10-CM | POA: Diagnosis not present

## 2022-11-03 DIAGNOSIS — N1831 Chronic kidney disease, stage 3a: Secondary | ICD-10-CM | POA: Diagnosis not present

## 2022-11-03 DIAGNOSIS — E782 Mixed hyperlipidemia: Secondary | ICD-10-CM | POA: Diagnosis not present

## 2022-11-10 DIAGNOSIS — I129 Hypertensive chronic kidney disease with stage 1 through stage 4 chronic kidney disease, or unspecified chronic kidney disease: Secondary | ICD-10-CM | POA: Diagnosis not present

## 2022-11-10 DIAGNOSIS — R1032 Left lower quadrant pain: Secondary | ICD-10-CM | POA: Diagnosis not present

## 2022-11-10 DIAGNOSIS — E785 Hyperlipidemia, unspecified: Secondary | ICD-10-CM | POA: Diagnosis not present

## 2022-11-10 DIAGNOSIS — Z9861 Coronary angioplasty status: Secondary | ICD-10-CM | POA: Diagnosis not present

## 2022-11-10 DIAGNOSIS — I2510 Atherosclerotic heart disease of native coronary artery without angina pectoris: Secondary | ICD-10-CM | POA: Diagnosis not present

## 2022-11-10 DIAGNOSIS — N182 Chronic kidney disease, stage 2 (mild): Secondary | ICD-10-CM | POA: Diagnosis not present

## 2022-11-10 DIAGNOSIS — Z23 Encounter for immunization: Secondary | ICD-10-CM | POA: Diagnosis not present

## 2022-11-10 DIAGNOSIS — Z85828 Personal history of other malignant neoplasm of skin: Secondary | ICD-10-CM | POA: Diagnosis not present

## 2022-11-10 DIAGNOSIS — N189 Chronic kidney disease, unspecified: Secondary | ICD-10-CM | POA: Diagnosis not present

## 2022-11-30 ENCOUNTER — Other Ambulatory Visit: Payer: Self-pay | Admitting: Family Medicine

## 2022-11-30 NOTE — Progress Notes (Signed)
COVID Vaccine received:  [x]  No []  Yes Date of any COVID positive Test in last 90 days: no PCP - Kerby Nora MD, Hurley Cisco @ [facility] Cardiologist - Dr. Erskine Emery MD  Chest x-ray -  EKG -  06/13/22 Epic Stress Test - 08/27/10 Epic ECHO - 09/20/13 Epic Cardiac Cath - 1999  Bowel Prep - [x]  No  []   Yes ______  Pacemaker / ICD device [x]  No []  Yes   Spinal Cord Stimulator:[x]  No []  Yes       History of Sleep Apnea? [x]  No []  Yes   CPAP used?- [x]  No []  Yes    Does the patient monitor blood sugar?          [x]  No []  Yes  []  N/A  Patient has: [x]  NO Hx DM   []  Pre-DM                 []  DM1  []   DM2 Does patient have a [employer] or Dexacom? []  No []  Yes   Fasting Blood Sugar Ranges-  Checks Blood Sugar _____ times a day  GLP1 agonist / usual dose - no GLP1 instructions:  SGLT-2 inhibitors / usual dose - no SGLT-2 instructions:   Blood Thinner / Instructions:no Aspirin Instructions:81 mg ASA daily.- Will stop on 12/06/22.  Comments:   Activity level: Patient is able to climb a flight of stairs without difficulty; [x]  No CP  [x]  No SOB,  ___   Patient can perform ADLs without assistance.   Anesthesia review: CAD w/ stent 1999, HTN, Aortic ejection murmur, L BBB noted on EKG 06/13/22  Patient denies shortness of breath, fever, cough and chest pain at PAT appointment.  Patient verbalized understanding and agreement to the Pre-Surgical Instructions that were given to them at this PAT appointment. Patient was also educated of the need to review these PAT instructions again prior to his/her surgery.I reviewed the appropriate phone numbers to call if they have any and questions or concerns.

## 2022-11-30 NOTE — Telephone Encounter (Signed)
Last office visit 10/28/2022 for CPE.  Last refilled 08/17/2022 for #90 with no refills.  Next Appt: 11/01/2023.

## 2022-11-30 NOTE — Patient Instructions (Signed)
SURGICAL WAITING ROOM VISITATION  Patients having surgery or a procedure may have no more than 2 support people in the waiting area - these visitors may rotate.    Children under the age of 10 must have an adult with them who is not the patient.  Due to an increase in RSV and influenza rates and associated hospitalizations, children ages 47 and under may not visit patients in [facility] hospitals.  If the patient needs to stay at the hospital during part of their recovery, the visitor guidelines for inpatient rooms apply. Pre-op nurse will coordinate an appropriate time for 1 support person to accompany patient in pre-op.  This support person may not rotate.    Please refer to the [facility] website for the visitor guidelines for Inpatients (after your surgery is over and you are in a regular room).       Your procedure is scheduled on: 12/14/22   Report to [facility] Main Entrance    Report to admitting at 7:45 AM   Call this number if you have problems the morning of surgery [phone]   Do not eat food :After Midnight.   After Midnight you may have the following liquids until 7 AM DAY OF SURGERY  Water Non-Citrus Juices (without pulp, NO RED-Apple, White grape, White cranberry) Black Coffee (NO MILK/CREAM OR CREAMERS, sugar ok)  Clear Tea (NO MILK/CREAM OR CREAMERS, sugar ok) regular and decaf                             Plain Jell-O (NO RED)                                           Fruit ices (not with fruit pulp, NO RED)                                     Popsicles (NO RED)                                                               Sports drinks like Gatorade (NO RED)                  The day of surgery:  Drink ONE (1) Pre-Surgery Clear Ensure at 7 AM the morning of surgery. Drink in one sitting. Do not sip.  This drink was given to you during your hospital  pre-op appointment visit. Nothing else to drink after completing the  Pre-Surgery Clear  Ensure.     Oral Hygiene is also important to reduce your risk of infection.                                    Remember - BRUSH YOUR TEETH THE MORNING OF SURGERY WITH YOUR REGULAR TOOTHPASTE  DENTURES WILL BE REMOVED PRIOR TO SURGERY PLEASE DO NOT APPLY "Poly grip" OR ADHESIVES!!!   Stop all vitamins and herbal supplements 7 days before surgery.   Take these medicines the  morning of surgery with A SIP OF WATER: Amlodipine, Clonidine, Depakote             You may not have any metal on your body including hair pins, jewelry, and body piercing             Do not wear make-up, lotions, powders, perfumes/cologne, or deodorant              Men may shave face and neck.   Do not bring valuables to the hospital. [place] IS NOT             RESPONSIBLE   FOR VALUABLES.   Contacts, glasses, dentures or bridgework may not be worn into surgery.   Bring small overnight bag day of surgery.   DO NOT BRING YOUR HOME MEDICATIONS TO THE HOSPITAL. PHARMACY WILL DISPENSE MEDICATIONS LISTED ON YOUR MEDICATION LIST TO YOU DURING YOUR ADMISSION IN THE HOSPITAL!    Patients discharged on the day of surgery will not be allowed to drive home.  Someone NEEDS to stay with you for the first 24 hours after anesthesia.   Special Instructions: Bring a copy of your healthcare power of attorney and living will documents the day of surgery if you haven't scanned them before.              Please read over the following fact sheets you were given: IF YOU HAVE QUESTIONS ABOUT YOUR PRE-OP INSTRUCTIONS PLEASE CALL [phone]   If you received a COVID test during your pre-op visit  it is requested that you wear a mask when out in public, stay away from anyone that may not be feeling well and notify your surgeon if you develop symptoms. If you test positive for Covid or have been in contact with anyone that has tested positive in the last 10 days please notify you surgeon.      Pre-operative 5 CHG Bath  Instructions   You can play a key role in reducing the risk of infection after surgery. Your skin needs to be as free of germs as possible. You can reduce the number of germs on your skin by washing with CHG (chlorhexidine gluconate) soap before surgery. CHG is an antiseptic soap that kills germs and continues to kill germs even after washing.   DO NOT use if you have an allergy to chlorhexidine/CHG or antibacterial soaps. If your skin becomes reddened or irritated, stop using the CHG and notify one of our RNs at [phone].   Please shower with the CHG soap starting 4 days before surgery using the following schedule:     Please keep in mind the following:  DO NOT shave, including legs and underarms, starting the day of your first shower.   You may shave your face at any point before/day of surgery.  Place clean sheets on your bed the day you start using CHG soap. Use a clean washcloth (not used since being washed) for each shower. DO NOT sleep with pets once you start using the CHG.   CHG Shower Instructions:  If you choose to wash your hair and private area, wash first with your normal shampoo/soap.  After you use shampoo/soap, rinse your hair and body thoroughly to remove shampoo/soap residue.  Turn the water OFF and apply about 3 tablespoons (45 ml) of CHG soap to a CLEAN washcloth.  Apply CHG soap ONLY FROM YOUR NECK DOWN TO YOUR TOES (washing for 3-5 minutes)  DO NOT use CHG soap on face, private  areas, open wounds, or sores.  Pay special attention to the area where your surgery is being performed.  If you are having back surgery, having someone wash your back for you may be helpful. Wait 2 minutes after CHG soap is applied, then you may rinse off the CHG soap.  Pat dry with a clean towel  Put on clean clothes/pajamas   If you choose to wear lotion, please use ONLY the CHG-compatible lotions on the back of this paper.     Additional instructions for the day of surgery: DO NOT  APPLY any lotions, deodorants, cologne, or perfumes.   Put on clean/comfortable clothes.  Brush your teeth.  Ask your nurse before applying any prescription medications to the skin.      CHG Compatible Lotions   Aveeno Moisturizing lotion  Cetaphil Moisturizing Cream  Cetaphil Moisturizing Lotion  Clairol Herbal Essence Moisturizing Lotion, Dry Skin  Clairol Herbal Essence Moisturizing Lotion, Extra Dry Skin  Clairol Herbal Essence Moisturizing Lotion, Normal Skin  Curel Age Defying Therapeutic Moisturizing Lotion with Alpha Hydroxy  Curel Extreme Care Body Lotion  Curel Soothing Hands Moisturizing Hand Lotion  Curel Therapeutic Moisturizing Cream, Fragrance-Free  Curel Therapeutic Moisturizing Lotion, Fragrance-Free  Curel Therapeutic Moisturizing Lotion, Original Formula  Eucerin Daily Replenishing Lotion  Eucerin Dry Skin Therapy Plus Alpha Hydroxy Crme  Eucerin Dry Skin Therapy Plus Alpha Hydroxy Lotion  Eucerin Original Crme  Eucerin Original Lotion  Eucerin Plus Crme Eucerin Plus Lotion  Eucerin TriLipid Replenishing Lotion  Keri Anti-Bacterial Hand Lotion  Keri Deep Conditioning Original Lotion Dry Skin Formula Softly Scented  Keri Deep Conditioning Original Lotion, Fragrance Free Sensitive Skin Formula  Keri Lotion Fast Absorbing Fragrance Free Sensitive Skin Formula  Keri Lotion Fast Absorbing Softly Scented Dry Skin Formula  Keri Original Lotion  Keri Skin Renewal Lotion Keri Silky Smooth Lotion  Keri Silky Smooth Sensitive Skin Lotion  Nivea Body Creamy Conditioning Oil  Nivea Body Extra Enriched Lotion  Nivea Body Original Lotion  Nivea Body Sheer Moisturizing Lotion Nivea Crme  Nivea Skin Firming Lotion  NutraDerm 30 Skin Lotion  NutraDerm Skin Lotion  NutraDerm Therapeutic Skin Cream  NutraDerm Therapeutic Skin Lotion  ProShield Protective Hand Cream   Incentive Spirometer (Watch this video at home: [email or web address])  An  incentive spirometer is a tool that can help keep your lungs clear and active. This tool measures how well you are filling your lungs with each breath. Taking long deep breaths may help reverse or decrease the chance of developing breathing (pulmonary) problems (especially infection) following: A long period of time when you are unable to move or be active. BEFORE THE PROCEDURE  If the spirometer includes an indicator to show your best effort, your nurse or respiratory therapist will set it to a desired goal. If possible, sit up straight or lean slightly forward. Try not to slouch. Hold the incentive spirometer in an upright position. INSTRUCTIONS FOR USE  Sit on the edge of your bed if possible, or sit up as far as you can in bed or on a chair. Hold the incentive spirometer in an upright position. Breathe out normally. Place the mouthpiece in your mouth and seal your lips tightly around it. Breathe in slowly and as deeply as possible, raising the piston or the ball toward the top of the column. Hold your breath for 3-5 seconds or for as long as possible. Allow the piston or ball to fall to the bottom of the column. Remove the mouthpiece from your  mouth and breathe out normally. Rest for a few seconds and repeat Steps 1 through 7 at least 10 times every 1-2 hours when you are awake. Take your time and take a few normal breaths between deep breaths. The spirometer may include an indicator to show your best effort. Use the indicator as a goal to work toward during each repetition. After each set of 10 deep breaths, practice coughing to be sure your lungs are clear. If you have an incision (the cut made at the time of surgery), support your incision when coughing by placing a pillow or rolled up towels firmly against it. Once you are able to get out of bed, walk around indoors and cough well. You may stop using the incentive spirometer when instructed by your caregiver.  RISKS AND COMPLICATIONS Take  your time so you do not get dizzy or light-headed. If you are in pain, you may need to take or ask for pain medication before doing incentive spirometry. It is harder to take a deep breath if you are having pain. AFTER USE Rest and breathe slowly and easily. It can be helpful to keep track of a log of your progress. Your caregiver can provide you with a simple table to help with this. If you are using the spirometer at home, follow these instructions: SEEK MEDICAL CARE IF:  You are having difficultly using the spirometer. You have trouble using the spirometer as often as instructed. Your pain medication is not giving enough relief while using the spirometer. You develop fever of 100.5 F (38.1 C) or higher. SEEK IMMEDIATE MEDICAL CARE IF:  You cough up bloody sputum that had not been present before. You develop fever of 102 F (38.9 C) or greater. You develop worsening pain at or near the incision site. MAKE SURE YOU:  Understand these instructions. Will watch your condition. Will get help right away if you are not doing well or get worse. Document Released: 05/24/2006 Document Revised: 04/05/2011 Document Reviewed: 07/25/2006 [facility] Patient Information 2014 [place], [place].

## 2022-12-02 ENCOUNTER — Encounter (HOSPITAL_COMMUNITY): Payer: Self-pay

## 2022-12-02 ENCOUNTER — Encounter (HOSPITAL_COMMUNITY)
Admission: RE | Admit: 2022-12-02 | Discharge: 2022-12-02 | Disposition: A | Discharge status: Home / Self Care | Payer: Medicare Other | Source: Ambulatory Visit | Attending: Orthopedic Surgery | Admitting: Orthopedic Surgery

## 2022-12-02 ENCOUNTER — Other Ambulatory Visit: Payer: Self-pay

## 2022-12-02 VITALS — BP 153/79 | HR 56 | Temp 98.0°F | Resp 20 | Ht 71.0 in | Wt 273.0 lb

## 2022-12-02 DIAGNOSIS — N189 Chronic kidney disease, unspecified: Secondary | ICD-10-CM | POA: Diagnosis not present

## 2022-12-02 DIAGNOSIS — Z955 Presence of coronary angioplasty implant and graft: Secondary | ICD-10-CM | POA: Diagnosis not present

## 2022-12-02 DIAGNOSIS — G4733 Obstructive sleep apnea (adult) (pediatric): Secondary | ICD-10-CM | POA: Insufficient documentation

## 2022-12-02 DIAGNOSIS — I10 Essential (primary) hypertension: Secondary | ICD-10-CM

## 2022-12-02 DIAGNOSIS — I2510 Atherosclerotic heart disease of native coronary artery without angina pectoris: Secondary | ICD-10-CM | POA: Insufficient documentation

## 2022-12-02 DIAGNOSIS — Z96652 Presence of left artificial knee joint: Secondary | ICD-10-CM | POA: Insufficient documentation

## 2022-12-02 DIAGNOSIS — E669 Obesity, unspecified: Secondary | ICD-10-CM | POA: Diagnosis not present

## 2022-12-02 DIAGNOSIS — Z6838 Body mass index (BMI) 38.0-38.9, adult: Secondary | ICD-10-CM | POA: Insufficient documentation

## 2022-12-02 DIAGNOSIS — D631 Anemia in chronic kidney disease: Secondary | ICD-10-CM | POA: Diagnosis not present

## 2022-12-02 DIAGNOSIS — Z01812 Encounter for preprocedural laboratory examination: Secondary | ICD-10-CM | POA: Diagnosis not present

## 2022-12-02 DIAGNOSIS — Z01818 Encounter for other preprocedural examination: Secondary | ICD-10-CM | POA: Diagnosis present

## 2022-12-02 DIAGNOSIS — Z79899 Other long term (current) drug therapy: Secondary | ICD-10-CM | POA: Insufficient documentation

## 2022-12-02 DIAGNOSIS — M1711 Unilateral primary osteoarthritis, right knee: Secondary | ICD-10-CM | POA: Insufficient documentation

## 2022-12-02 DIAGNOSIS — I129 Hypertensive chronic kidney disease with stage 1 through stage 4 chronic kidney disease, or unspecified chronic kidney disease: Secondary | ICD-10-CM | POA: Diagnosis not present

## 2022-12-02 DIAGNOSIS — R569 Unspecified convulsions: Secondary | ICD-10-CM | POA: Insufficient documentation

## 2022-12-02 HISTORY — DX: Dyspnea, unspecified: R06.00

## 2022-12-02 LAB — CBC
HCT: 37.6 % — ABNORMAL LOW (ref 39.0–52.0)
Hemoglobin: 11.9 g/dL — ABNORMAL LOW (ref 13.0–17.0)
MCH: 30.7 pg (ref 26.0–34.0)
MCHC: 31.6 g/dL (ref 30.0–36.0)
MCV: 96.9 fL (ref 80.0–100.0)
Platelets: 226 10*3/uL (ref 150–400)
RBC: 3.88 MIL/uL — ABNORMAL LOW (ref 4.22–5.81)
RDW: 12.3 % (ref 11.5–15.5)
WBC: 4.8 10*3/uL (ref 4.0–10.5)
nRBC: 0 % (ref 0.0–0.2)

## 2022-12-02 LAB — BASIC METABOLIC PANEL
Anion gap: 9 (ref 5–15)
BUN: 43 mg/dL — ABNORMAL HIGH (ref 8–23)
CO2: 23 mmol/L (ref 22–32)
Calcium: 9.6 mg/dL (ref 8.9–10.3)
Chloride: 107 mmol/L (ref 98–111)
Creatinine, Ser: 1.41 mg/dL — ABNORMAL HIGH (ref 0.61–1.24)
GFR, Estimated: 52 mL/min — ABNORMAL LOW (ref >60)
Glucose, Bld: 96 mg/dL (ref 70–99)
Potassium: 5.1 mmol/L (ref 3.5–5.1)
Sodium: 139 mmol/L (ref 135–145)

## 2022-12-02 LAB — SURGICAL PCR SCREEN
MRSA, PCR: NEGATIVE
Staphylococcus aureus: NEGATIVE

## 2022-12-03 NOTE — Progress Notes (Addendum)
DISCUSSION: Joshua Stark is a 77 yo male who presents to PAT prior to R TKA on 12/14/22. PMH of CAD s/p PCI (1999), HTN, HLD, mild OSA (no CPAP use), CKD, seizures, anemia, obesity, arthritis.   Patient follows with Cardiology at [facility] for CAD, HTN, HLD. BP noted to be difficult to control and he was recently started on Clonidine. Also statin intolerant and takes Repatha. He was last seen on 11/10/22. BP noted to be better controlled since adding Clonidine. No additional testing recommended. Advised f/u in 6 months.  Patient follows with PCP for other chronic medical conditions which appear to be stable. Last seen on 11/10/22. Zepbound prescribed for obesity which is not on med list here. Per PAT RN patient will not start this med before surgery.  VS: BP (!) 153/79   Pulse (!) 56   Temp 36.7 C (Oral)   Resp 20   Ht 5\' 11"  (1.803 m)   Wt 123.8 kg   SpO2 98%   BMI 38.08 kg/m   PROVIDERS: 2 PCPs?: Excell Seltzer, MD ([facility]) + Hurley Cisco @ [facility]  Cardiologist - Dr. Erskine Emery MD   LABS: Labs reviewed: Acceptable for surgery. Anemia and CKD at baseline (all labs ordered are listed, but only abnormal results are displayed)  Labs Reviewed  BASIC METABOLIC PANEL - Abnormal; Notable for the following components:      Result Value   BUN 43 (*)    Creatinine, Ser 1.41 (*)    GFR, Estimated 52 (*)    All other components within normal limits  CBC - Abnormal; Notable for the following components:   RBC 3.88 (*)    Hemoglobin 11.9 (*)    HCT 37.6 (*)    All other components within normal limits  SURGICAL PCR SCREEN     IMAGES:   EKG 06/13/22  Sinus rhythm Short PR interval IVCD, consider atypical LBBB (similar to prior)  CV:  Stress test 06/20/2019:   FINAL COMMENTS   - Myocardial perfusion imaging is normal. No evidence of regadenoson-inducible ischemia.   - Left ventricular ejection fraction is mildly reduced at rest and normal at stress, as above. Septal dyskinesis;  otherwise normal   wall motion.   Echo 06/01/2019  INTERPRETATION ---------------------------------------------------------------    NORMAL LEFT VENTRICULAR SYSTOLIC FUNCTION WITH MILD LVH    NORMAL RIGHT VENTRICULAR SYSTOLIC FUNCTION    VALVULAR REGURGITATION: TRIVIAL AR, TRIVIAL MR, TRIVIAL PR, TRIVIAL TR    NO VALVULAR STENOSIS    RVSP AT LEAST 31 mmHg    POOR SUBCOSTAL WINDOW    NO PRIOR STUDY FOR COMPARISON   Past Medical History:  Diagnosis Date   Arthritis    CAD S/P percutaneous coronary angioplasty 1999   PCI- prox LAD @ D1, SP1 trifurcation.  3.0 mm x 16 mm AVE-GFX BMS   Dyspnea    History of nuclear stress test 08/2010   Treadmill Myoview: 9 minutes, 10 METS --> hypertensive response.  No evidence of ischemia or infarction.  EF 64%   HLD (hyperlipidemia)    Hypertension    Obesity (BMI 30-39.9)    Seizure disorder (HCC)    Seizures (HCC)    hx of years ago     Past Surgical History:  Procedure Laterality Date   CARDIAC CATHETERIZATION  01/20/1998   CORONARY ANGIOPLASTY  01/20/1998   PCI to LAD ,prox LAD at trifurcation for D1and SP1.3.0-x16-mm AVE-GFX bare-metal stent   FEMUR IM NAIL Left 08/23/2018   Procedure: INTRAMEDULLARY (IM) RETROGRADE  FEMORAL NAILING;  Surgeon: Durene Romans, MD;  Location: [facility] ORS;  Service: Orthopedics;  Laterality: Left;   NM MYOCAR PERF WALL MOTION  Aug 2 ,2012   treadmill myoview exercised 9 minutes, reacing 10 metaboblic equivlents,had ahypertensive reponse .EF64%   TOTAL KNEE ARTHROPLASTY Left 08/17/2016   Procedure: LEFT TOTAL KNEE ARTHROPLASTY;  Surgeon: Durene Romans, MD;  Location: [facility] ORS;  Service: Orthopedics;  Laterality: Left;   TRANSTHORACIC ECHOCARDIOGRAM  09/20/2013   normal LV size and function. EF 60-65%. Normal diastolic function. Mild LA dilation and mild AI.  No evidence of aortic stenosis. No significant MR.    MEDICATIONS:  amLODipine (NORVASC) 5 MG tablet   Ascorbic Acid (VITAMIN C) 500 MG CAPS   aspirin EC 81 MG  tablet   cloNIDine (CATAPRES) 0.1 MG tablet   DEPAKOTE 250 MG DR tablet   Evolocumab (REPATHA) 140 MG/ML SOSY   Garlic 500 MG TABS   hydrochlorothiazide (MICROZIDE) 12.5 MG capsule   Multiple Vitamin (MULTIVITAMIN) capsule   NITROSTAT 0.4 MG SL tablet   valsartan (DIOVAN) 320 MG tablet   No current facility-administered medications for this encounter.   Marcille Blanco [facility] Surgical Short Stay/Anesthesiology [facility] Phone [phone] 12/03/2022 11:49 AM

## 2022-12-03 NOTE — Anesthesia Preprocedure Evaluation (Addendum)
Anesthesia Evaluation  Patient identified by MRN, date of birth, ID band Patient awake    Reviewed: Allergy & Precautions, NPO status , Patient's Chart, lab work & pertinent test results  Airway Mallampati: II  TM Distance: >3 FB Neck ROM: Full    Dental  (+) Teeth Intact, Dental Advisory Given   Pulmonary neg pulmonary ROS   Pulmonary exam normal breath sounds clear to auscultation       Cardiovascular hypertension, Pt. on medications (-) angina + CAD and + Cardiac Stents  (-) Past MI Normal cardiovascular exam Rhythm:Regular Rate:Normal     Neuro/Psych Seizures -, Well Controlled,   Neuromuscular disease    GI/Hepatic negative GI ROS, Neg liver ROS,,,  Endo/Other  Obesity   Renal/GU Renal InsufficiencyRenal disease     Musculoskeletal  (+) Arthritis ,    Abdominal   Peds  Hematology  (+) Blood dyscrasia, anemia Plt 226k   Anesthesia Other Findings Day of surgery medications reviewed with the patient.  Reproductive/Obstetrics                             Anesthesia Physical Anesthesia Plan  ASA: 3  Anesthesia Plan: Spinal   Post-op Pain Management: Regional block* and Tylenol PO (pre-op)*   Induction: Intravenous  PONV Risk Score and Plan: 1 and TIVA, Dexamethasone and Ondansetron  Airway Management Planned: Natural Airway and Simple Face Mask  Additional Equipment:   Intra-op Plan:   Post-operative Plan:   Informed Consent: I have reviewed the patients History and Physical, chart, labs and discussed the procedure including the risks, benefits and alternatives for the proposed anesthesia with the patient or authorized representative who has indicated his/her understanding and acceptance.     Dental advisory given  Plan Discussed with: CRNA  Anesthesia Plan Comments: (See PAT note from 11/7 by Sherlie Ban PA-C )        Anesthesia Quick Evaluation

## 2022-12-14 ENCOUNTER — Observation Stay (HOSPITAL_COMMUNITY)
Admission: RE | Admit: 2022-12-14 | Discharge: 2022-12-15 | Disposition: A | Discharge status: Home / Self Care | Payer: Medicare Other | Attending: Orthopedic Surgery | Admitting: Orthopedic Surgery

## 2022-12-14 ENCOUNTER — Ambulatory Visit (HOSPITAL_COMMUNITY): Payer: Medicare Other | Admitting: Medical

## 2022-12-14 ENCOUNTER — Ambulatory Visit (HOSPITAL_BASED_OUTPATIENT_CLINIC_OR_DEPARTMENT_OTHER): Payer: Medicare Other | Admitting: Anesthesiology

## 2022-12-14 ENCOUNTER — Other Ambulatory Visit: Payer: Self-pay

## 2022-12-14 ENCOUNTER — Encounter (HOSPITAL_COMMUNITY): Payer: Self-pay | Admitting: Orthopedic Surgery

## 2022-12-14 ENCOUNTER — Encounter (HOSPITAL_COMMUNITY)
Admission: RE | Disposition: A | Discharge status: Home / Self Care | Payer: Self-pay | Source: Home / Self Care | Attending: Orthopedic Surgery

## 2022-12-14 DIAGNOSIS — Z96652 Presence of left artificial knee joint: Secondary | ICD-10-CM | POA: Insufficient documentation

## 2022-12-14 DIAGNOSIS — Z7982 Long term (current) use of aspirin: Secondary | ICD-10-CM | POA: Insufficient documentation

## 2022-12-14 DIAGNOSIS — N182 Chronic kidney disease, stage 2 (mild): Secondary | ICD-10-CM | POA: Insufficient documentation

## 2022-12-14 DIAGNOSIS — I2510 Atherosclerotic heart disease of native coronary artery without angina pectoris: Secondary | ICD-10-CM | POA: Diagnosis not present

## 2022-12-14 DIAGNOSIS — M1711 Unilateral primary osteoarthritis, right knee: Secondary | ICD-10-CM

## 2022-12-14 DIAGNOSIS — Z96651 Presence of right artificial knee joint: Principal | ICD-10-CM

## 2022-12-14 DIAGNOSIS — I129 Hypertensive chronic kidney disease with stage 1 through stage 4 chronic kidney disease, or unspecified chronic kidney disease: Secondary | ICD-10-CM | POA: Diagnosis not present

## 2022-12-14 DIAGNOSIS — G8918 Other acute postprocedural pain: Secondary | ICD-10-CM | POA: Diagnosis not present

## 2022-12-14 DIAGNOSIS — Z79899 Other long term (current) drug therapy: Secondary | ICD-10-CM | POA: Insufficient documentation

## 2022-12-14 HISTORY — PX: TOTAL KNEE ARTHROPLASTY: SHX125

## 2022-12-14 SURGERY — ARTHROPLASTY, KNEE, TOTAL
Anesthesia: Spinal | Site: Knee | Laterality: Right

## 2022-12-14 MED ORDER — ROPIVACAINE HCL 5 MG/ML IJ SOLN
INTRAMUSCULAR | Status: DC | PRN
  Administered 2022-12-14: 20 mL via PERINEURAL

## 2022-12-14 MED ORDER — ONDANSETRON HCL 4 MG PO TABS: 4.0000 mg | ORAL_TABLET | Freq: Four times a day (QID) | ORAL | Status: DC | PRN

## 2022-12-14 MED ORDER — METHOCARBAMOL 500 MG PO TABS
500.0000 mg | ORAL_TABLET | Freq: Four times a day (QID) | ORAL | Status: DC | PRN
  Administered 2022-12-15: 500 mg via ORAL
  Filled 2022-12-14: qty 1

## 2022-12-14 MED ORDER — OXYCODONE HCL 5 MG PO TABS
10.0000 mg | ORAL_TABLET | ORAL | Status: DC | PRN
  Administered 2022-12-14: 10 mg via ORAL

## 2022-12-14 MED ORDER — KETOROLAC TROMETHAMINE 30 MG/ML IJ SOLN
INTRAMUSCULAR | Status: AC
  Filled 2022-12-14: qty 1

## 2022-12-14 MED ORDER — IRBESARTAN 150 MG PO TABS
300.0000 mg | ORAL_TABLET | Freq: Every day | ORAL | Status: DC
  Administered 2022-12-15: 300 mg via ORAL
  Filled 2022-12-14: qty 2

## 2022-12-14 MED ORDER — DIPHENHYDRAMINE HCL 12.5 MG/5ML PO ELIX: 12.5000 mg | ORAL_SOLUTION | ORAL | Status: DC | PRN

## 2022-12-14 MED ORDER — ORAL CARE MOUTH RINSE: 15.0000 mL | Freq: Once | OROMUCOSAL | Status: AC

## 2022-12-14 MED ORDER — METOCLOPRAMIDE HCL 5 MG/ML IJ SOLN: 5.0000 mg | Freq: Three times a day (TID) | INTRAMUSCULAR | Status: DC | PRN

## 2022-12-14 MED ORDER — SODIUM CHLORIDE (PF) 0.9 % IJ SOLN
INTRAMUSCULAR | Status: DC | PRN
  Administered 2022-12-14: 30 mL

## 2022-12-14 MED ORDER — PHENYLEPHRINE HCL-NACL 20-0.9 MG/250ML-% IV SOLN
INTRAVENOUS | Status: DC | PRN
  Administered 2022-12-14: 40 ug/min via INTRAVENOUS

## 2022-12-14 MED ORDER — ASPIRIN 81 MG PO CHEW
81.0000 mg | CHEWABLE_TABLET | Freq: Two times a day (BID) | ORAL | Status: DC
Start: 2022-12-14 — End: 2022-12-15
  Administered 2022-12-14 – 2022-12-15 (×2): 81 mg via ORAL
  Filled 2022-12-14 (×2): qty 1

## 2022-12-14 MED ORDER — CHLORHEXIDINE GLUCONATE 0.12 % MT SOLN
15.0000 mL | Freq: Once | OROMUCOSAL | Status: AC
  Administered 2022-12-14: 15 mL via OROMUCOSAL

## 2022-12-14 MED ORDER — DEXAMETHASONE SODIUM PHOSPHATE 10 MG/ML IJ SOLN
INTRAMUSCULAR | Status: DC | PRN
  Administered 2022-12-14: 10 mg

## 2022-12-14 MED ORDER — BUPIVACAINE-EPINEPHRINE 0.25% -1:200000 IJ SOLN
INTRAMUSCULAR | Status: AC
  Filled 2022-12-14: qty 1

## 2022-12-14 MED ORDER — ALUM & MAG HYDROXIDE-SIMETH 200-200-20 MG/5ML PO SUSP
30.0000 mL | ORAL | Status: DC | PRN
Start: 2022-12-14 — End: 2022-12-15

## 2022-12-14 MED ORDER — OXYCODONE HCL 5 MG PO TABS
5.0000 mg | ORAL_TABLET | ORAL | Status: DC | PRN
  Administered 2022-12-14 – 2022-12-15 (×2): 5 mg via ORAL
  Filled 2022-12-14 (×2): qty 1
  Filled 2022-12-14: qty 2

## 2022-12-14 MED ORDER — CLONIDINE HCL 0.1 MG PO TABS
0.1000 mg | ORAL_TABLET | Freq: Every day | ORAL | Status: DC
  Administered 2022-12-15: 0.1 mg via ORAL
  Filled 2022-12-14: qty 1

## 2022-12-14 MED ORDER — DEXAMETHASONE SODIUM PHOSPHATE 10 MG/ML IJ SOLN
INTRAMUSCULAR | Status: AC
  Filled 2022-12-14: qty 1

## 2022-12-14 MED ORDER — DEXAMETHASONE SODIUM PHOSPHATE 10 MG/ML IJ SOLN
8.0000 mg | Freq: Once | INTRAMUSCULAR | Status: AC
  Administered 2022-12-14: 8 mg via INTRAVENOUS

## 2022-12-14 MED ORDER — MIDAZOLAM HCL 2 MG/2ML IJ SOLN: 1.0000 mg | INTRAMUSCULAR | Status: DC

## 2022-12-14 MED ORDER — BUPIVACAINE-EPINEPHRINE (PF) 0.25% -1:200000 IJ SOLN
INTRAMUSCULAR | Status: DC | PRN
  Administered 2022-12-14: 30 mL

## 2022-12-14 MED ORDER — BUPIVACAINE IN DEXTROSE 0.75-8.25 % IT SOLN
INTRATHECAL | Status: DC | PRN
  Administered 2022-12-14: 1.6 mL via INTRATHECAL

## 2022-12-14 MED ORDER — ONDANSETRON HCL 4 MG/2ML IJ SOLN
INTRAMUSCULAR | Status: DC | PRN
  Administered 2022-12-14: 4 mg via INTRAVENOUS

## 2022-12-14 MED ORDER — PROPOFOL 500 MG/50ML IV EMUL
INTRAVENOUS | Status: AC
  Filled 2022-12-14: qty 50

## 2022-12-14 MED ORDER — POVIDONE-IODINE 10 % EX SWAB: 2.0000 | Freq: Once | CUTANEOUS | Status: DC

## 2022-12-14 MED ORDER — PROPOFOL 10 MG/ML IV BOLUS
INTRAVENOUS | Status: DC | PRN
Start: 2022-12-14 — End: 2022-12-14
  Administered 2022-12-14: 30 mg via INTRAVENOUS
  Administered 2022-12-14: 60 mg via INTRAVENOUS

## 2022-12-14 MED ORDER — MELOXICAM 15 MG PO TABS: 15.0000 mg | ORAL_TABLET | Freq: Every day | ORAL | Status: DC

## 2022-12-14 MED ORDER — AMLODIPINE BESYLATE 10 MG PO TABS
10.0000 mg | ORAL_TABLET | Freq: Every day | ORAL | Status: DC
Start: 2022-12-15 — End: 2022-12-15
  Administered 2022-12-15: 10 mg via ORAL
  Filled 2022-12-14: qty 1

## 2022-12-14 MED ORDER — LACTATED RINGERS IV SOLN: INTRAVENOUS | Status: DC | PRN

## 2022-12-14 MED ORDER — HYDROCHLOROTHIAZIDE 12.5 MG PO TABS
12.5000 mg | ORAL_TABLET | Freq: Every day | ORAL | Status: DC
  Administered 2022-12-15: 12.5 mg via ORAL
  Filled 2022-12-14: qty 1

## 2022-12-14 MED ORDER — LACTATED RINGERS IV SOLN: INTRAVENOUS | Status: DC

## 2022-12-14 MED ORDER — PHENOL 1.4 % MT LIQD: 1.0000 | OROMUCOSAL | Status: DC | PRN

## 2022-12-14 MED ORDER — SODIUM CHLORIDE (PF) 0.9 % IJ SOLN
INTRAMUSCULAR | Status: AC
  Filled 2022-12-14: qty 30

## 2022-12-14 MED ORDER — DEXAMETHASONE SODIUM PHOSPHATE 10 MG/ML IJ SOLN
10.0000 mg | Freq: Once | INTRAMUSCULAR | Status: AC
  Administered 2022-12-15: 10 mg via INTRAVENOUS
  Filled 2022-12-14: qty 1

## 2022-12-14 MED ORDER — 0.9 % SODIUM CHLORIDE (POUR BTL) OPTIME
TOPICAL | Status: DC | PRN
  Administered 2022-12-14: 1000 mL

## 2022-12-14 MED ORDER — TRANEXAMIC ACID-NACL 1000-0.7 MG/100ML-% IV SOLN
1000.0000 mg | INTRAVENOUS | Status: AC
  Administered 2022-12-14: 1000 mg via INTRAVENOUS
  Filled 2022-12-14: qty 100

## 2022-12-14 MED ORDER — ONDANSETRON HCL 4 MG/2ML IJ SOLN: 4.0000 mg | Freq: Four times a day (QID) | INTRAMUSCULAR | Status: DC | PRN

## 2022-12-14 MED ORDER — FENTANYL CITRATE PF 50 MCG/ML IJ SOSY
50.0000 ug | PREFILLED_SYRINGE | INTRAMUSCULAR | Status: AC
  Administered 2022-12-14: 50 ug via INTRAVENOUS
  Filled 2022-12-14: qty 2

## 2022-12-14 MED ORDER — METHOCARBAMOL 1000 MG/10ML IJ SOLN: 500.0000 mg | Freq: Four times a day (QID) | INTRAMUSCULAR | Status: DC | PRN

## 2022-12-14 MED ORDER — STERILE WATER FOR IRRIGATION IR SOLN
Status: DC | PRN
  Administered 2022-12-14: 1000 mL

## 2022-12-14 MED ORDER — BISACODYL 10 MG RE SUPP: 10.0000 mg | Freq: Every day | RECTAL | Status: DC | PRN

## 2022-12-14 MED ORDER — DIVALPROEX SODIUM 250 MG PO DR TAB
250.0000 mg | DELAYED_RELEASE_TABLET | Freq: Two times a day (BID) | ORAL | Status: DC
  Administered 2022-12-14: 250 mg via ORAL
  Filled 2022-12-14 (×2): qty 1

## 2022-12-14 MED ORDER — METOCLOPRAMIDE HCL 5 MG PO TABS: 5.0000 mg | ORAL_TABLET | Freq: Three times a day (TID) | ORAL | Status: DC | PRN

## 2022-12-14 MED ORDER — SODIUM CHLORIDE 0.9 % IR SOLN
Status: DC | PRN
  Administered 2022-12-14: 1000 mL

## 2022-12-14 MED ORDER — TRANEXAMIC ACID-NACL 1000-0.7 MG/100ML-% IV SOLN
1000.0000 mg | Freq: Once | INTRAVENOUS | Status: AC
  Administered 2022-12-14: 1000 mg via INTRAVENOUS
  Filled 2022-12-14: qty 100

## 2022-12-14 MED ORDER — ONDANSETRON HCL 4 MG/2ML IJ SOLN: 4.0000 mg | Freq: Once | INTRAMUSCULAR | Status: DC | PRN

## 2022-12-14 MED ORDER — KETOROLAC TROMETHAMINE 30 MG/ML IJ SOLN
INTRAMUSCULAR | Status: DC | PRN
  Administered 2022-12-14: 30 mg

## 2022-12-14 MED ORDER — ACETAMINOPHEN 500 MG PO TABS
1000.0000 mg | ORAL_TABLET | Freq: Once | ORAL | Status: AC
  Administered 2022-12-14: 1000 mg via ORAL
  Filled 2022-12-14: qty 2

## 2022-12-14 MED ORDER — MENTHOL 3 MG MT LOZG: 1.0000 | LOZENGE | OROMUCOSAL | Status: DC | PRN

## 2022-12-14 MED ORDER — PROPOFOL 500 MG/50ML IV EMUL
INTRAVENOUS | Status: DC | PRN
  Administered 2022-12-14: 120 ug/kg/min via INTRAVENOUS

## 2022-12-14 MED ORDER — SENNA 8.6 MG PO TABS
2.0000 | ORAL_TABLET | Freq: Every day | ORAL | Status: DC
  Administered 2022-12-14: 17.2 mg via ORAL
  Filled 2022-12-14: qty 2

## 2022-12-14 MED ORDER — PROPOFOL 1000 MG/100ML IV EMUL
INTRAVENOUS | Status: AC
  Filled 2022-12-14: qty 100

## 2022-12-14 MED ORDER — CEFAZOLIN SODIUM-DEXTROSE 2-4 GM/100ML-% IV SOLN
2.0000 g | Freq: Four times a day (QID) | INTRAVENOUS | Status: AC
  Administered 2022-12-14 (×2): 2 g via INTRAVENOUS
  Filled 2022-12-14 (×2): qty 100

## 2022-12-14 MED ORDER — FENTANYL CITRATE PF 50 MCG/ML IJ SOSY: 25.0000 ug | PREFILLED_SYRINGE | INTRAMUSCULAR | Status: DC | PRN

## 2022-12-14 MED ORDER — ONDANSETRON HCL 4 MG/2ML IJ SOLN
INTRAMUSCULAR | Status: AC
  Filled 2022-12-14: qty 2

## 2022-12-14 MED ORDER — POLYETHYLENE GLYCOL 3350 17 G PO PACK
17.0000 g | PACK | Freq: Two times a day (BID) | ORAL | Status: DC
Start: 2022-12-14 — End: 2022-12-15
  Administered 2022-12-14 – 2022-12-15 (×2): 17 g via ORAL
  Filled 2022-12-14 (×2): qty 1

## 2022-12-14 MED ORDER — SODIUM CHLORIDE 0.9% FLUSH
10.0000 mL | Freq: Two times a day (BID) | INTRAVENOUS | Status: DC
  Administered 2022-12-14 – 2022-12-15 (×2): 10 mL via INTRAVENOUS

## 2022-12-14 MED ORDER — ACETAMINOPHEN 500 MG PO TABS
1000.0000 mg | ORAL_TABLET | Freq: Four times a day (QID) | ORAL | Status: DC
  Administered 2022-12-14 – 2022-12-15 (×4): 1000 mg via ORAL
  Filled 2022-12-14 (×5): qty 2

## 2022-12-14 MED ORDER — PHENYLEPHRINE HCL-NACL 20-0.9 MG/250ML-% IV SOLN
INTRAVENOUS | Status: AC
  Filled 2022-12-14: qty 250

## 2022-12-14 MED ORDER — CEFAZOLIN SODIUM-DEXTROSE 2-4 GM/100ML-% IV SOLN
2.0000 g | INTRAVENOUS | Status: AC
  Administered 2022-12-14: 2 g via INTRAVENOUS
  Filled 2022-12-14: qty 100

## 2022-12-14 MED ORDER — HYDROMORPHONE HCL 1 MG/ML IJ SOLN
0.5000 mg | INTRAMUSCULAR | Status: DC | PRN
  Administered 2022-12-14: 1 mg via INTRAVENOUS
  Filled 2022-12-14: qty 1

## 2022-12-14 SURGICAL SUPPLY — 51 items
ATTUNE MED ANAT PAT 38 KNEE (Knees) IMPLANT
BAG COUNTER SPONGE SURGICOUNT (BAG) IMPLANT
BAG ZIPLOCK 12X15 (MISCELLANEOUS) IMPLANT
BASE TIBIAL CEM ATTUNE SZ 7 (Knees) ×1 IMPLANT
BASEPLATE TIB CEM ATTUNE SZ7 (Knees) IMPLANT
BLADE SAW SGTL 13.0X1.19X90.0M (BLADE) ×2 IMPLANT
BNDG ELASTIC 6INX 5YD STR LF (GAUZE/BANDAGES/DRESSINGS) ×2 IMPLANT
BOWL SMART MIX CTS (DISPOSABLE) ×2 IMPLANT
CEMENT HV SMART SET (Cement) ×4 IMPLANT
COMP FEM CMT ATTUNE KNEE 8 RT (Joint) ×1 IMPLANT
COMPONENT FEM CMT ATTN KN 8 RT (Joint) IMPLANT
COVER SURGICAL LIGHT HANDLE (MISCELLANEOUS) ×2 IMPLANT
CUFF TOURN SGL QUICK 34 (TOURNIQUET CUFF) ×1
CUFF TRNQT CYL 34X4.125X (TOURNIQUET CUFF) ×2 IMPLANT
DERMABOND ADVANCED .7 DNX12 (GAUZE/BANDAGES/DRESSINGS) ×2 IMPLANT
DRAPE U-SHAPE 47X51 STRL (DRAPES) ×2 IMPLANT
DRESSING AQUACEL AG SP 3.5X10 (GAUZE/BANDAGES/DRESSINGS) ×2 IMPLANT
DRSG AQUACEL AG ADV 3.5X10 (GAUZE/BANDAGES/DRESSINGS) IMPLANT
DRSG AQUACEL AG SP 3.5X10 (GAUZE/BANDAGES/DRESSINGS) ×1
DURAPREP 26ML APPLICATOR (WOUND CARE) ×4 IMPLANT
ELECT REM PT RETURN 15FT ADLT (MISCELLANEOUS) ×2 IMPLANT
GLOVE BIO SURGEON STRL SZ 6 (GLOVE) ×2 IMPLANT
GLOVE BIOGEL PI IND STRL 6.5 (GLOVE) ×2 IMPLANT
GLOVE BIOGEL PI IND STRL 7.5 (GLOVE) ×2 IMPLANT
GLOVE ORTHO TXT STRL SZ7.5 (GLOVE) ×4 IMPLANT
GOWN STRL REUS W/ TWL LRG LVL3 (GOWN DISPOSABLE) ×4 IMPLANT
GOWN STRL REUS W/TWL LRG LVL3 (GOWN DISPOSABLE) ×2
HANDPIECE INTERPULSE COAX TIP (DISPOSABLE) ×1
HOLDER FOLEY CATH W/STRAP (MISCELLANEOUS) IMPLANT
INSERT TIB CMT ATT KNEE 8X6 RT (Insert) IMPLANT
KIT TURNOVER KIT A (KITS) IMPLANT
MANIFOLD NEPTUNE II (INSTRUMENTS) ×2 IMPLANT
NDL SAFETY ECLIPSE 18X1.5 (NEEDLE) IMPLANT
NDL SIDE PORT 18GA QUICK PRESS (NEEDLE) IMPLANT
NS IRRIG 1000ML POUR BTL (IV SOLUTION) ×2 IMPLANT
PACK TOTAL KNEE CUSTOM (KITS) ×2 IMPLANT
PIN FIX SIGMA LCS THRD HI (PIN) IMPLANT
PROTECTOR NERVE ULNAR (MISCELLANEOUS) ×2 IMPLANT
SET HNDPC FAN SPRY TIP SCT (DISPOSABLE) ×2 IMPLANT
SET PAD KNEE POSITIONER (MISCELLANEOUS) ×2 IMPLANT
SPIKE FLUID TRANSFER (MISCELLANEOUS) ×4 IMPLANT
SUT MNCRL AB 4-0 PS2 18 (SUTURE) ×2 IMPLANT
SUT STRATAFIX PDS+ 0 24IN (SUTURE) ×2 IMPLANT
SUT VIC AB 1 CT1 36 (SUTURE) ×2 IMPLANT
SUT VIC AB 2-0 CT1 TAPERPNT 27 (SUTURE) ×4 IMPLANT
SYR 3ML LL SCALE MARK (SYRINGE) ×2 IMPLANT
TOWEL GREEN STERILE FF (TOWEL DISPOSABLE) ×2 IMPLANT
TRAY FOLEY MTR SLVR 16FR STAT (SET/KITS/TRAYS/PACK) ×2 IMPLANT
TUBE SUCTION HIGH CAP CLEAR NV (SUCTIONS) ×2 IMPLANT
WATER STERILE IRR 1000ML POUR (IV SOLUTION) ×4 IMPLANT
WRAP KNEE MAXI GEL POST OP (GAUZE/BANDAGES/DRESSINGS) ×2 IMPLANT

## 2022-12-14 NOTE — Discharge Instructions (Signed)

## 2022-12-14 NOTE — Plan of Care (Signed)
  Problem: Education: Goal: Knowledge of General Education information will improve Description: Including pain rating scale, medication(s)/side effects and non-pharmacologic comfort measures Outcome: Progressing   Problem: Activity: Goal: Risk for activity intolerance will decrease Outcome: Progressing   Problem: Nutrition: Goal: Adequate nutrition will be maintained Outcome: Progressing   Problem: Elimination: Goal: Will not experience complications related to bowel motility Outcome: Progressing   Problem: Pain Management: Goal: General experience of comfort will improve Outcome: Progressing   Problem: Skin Integrity: Goal: Risk for impaired skin integrity will decrease Outcome: Progressing

## 2022-12-14 NOTE — Anesthesia Postprocedure Evaluation (Signed)
Anesthesia Post Note  Patient: Lymon Segerstrom Slaugh  Procedure(s) Performed: TOTAL KNEE ARTHROPLASTY (Right: Knee)     Patient location during evaluation: PACU Anesthesia Type: Spinal Level of consciousness: awake, awake and alert and oriented Pain management: pain level controlled Vital Signs Assessment: post-procedure vital signs reviewed and stable Respiratory status: spontaneous breathing, nonlabored ventilation and respiratory function stable Cardiovascular status: blood pressure returned to baseline and stable Postop Assessment: no headache, no backache, spinal receding and no apparent nausea or vomiting Anesthetic complications: no   No notable events documented.  Last Vitals:  Vitals:   12/14/22 1533 12/14/22 1749  BP: (!) 151/68 (!) 150/73  Pulse: (!) 59 (!) 56  Resp: 17 18  Temp: 36.7 C (!) 36.4 C  SpO2: 99% 97%    Last Pain:  Vitals:   12/14/22 1530  TempSrc:   PainSc: 2                  Collene Schlichter

## 2022-12-14 NOTE — Anesthesia Procedure Notes (Signed)
Anesthesia Regional Block: Adductor canal block   Pre-Anesthetic Checklist: , timeout performed,  Correct Patient, Correct Site, Correct Laterality,  Correct Procedure, Correct Position, site marked,  Risks and benefits discussed,  Surgical consent,  Pre-op evaluation,  At surgeon's request and post-op pain management  Laterality: Right  Prep: chloraprep       Needles:  Injection technique: Single-shot  Needle Type: Echogenic Needle     Needle Length: 9cm  Needle Gauge: 21     Additional Needles:   Procedures:,,,, ultrasound used (permanent image in chart),,    Narrative:  Start time: 12/14/2022 9:08 AM End time: 12/14/2022 9:14 AM Injection made incrementally with aspirations every 5 mL.  Performed by: Personally  Anesthesiologist: Collene Schlichter, MD  Additional Notes: No pain on injection. No increased resistance to injection. Injection made in 5cc increments.  Good needle visualization.  Patient tolerated procedure well.

## 2022-12-14 NOTE — H&P (Signed)
TOTAL KNEE ADMISSION H&P  Patient is being admitted for right total knee arthroplasty.  Therapy Plans: outpatient therapy at [facility] Disposition: Home with wife & kids Planned DVT Prophylaxis: aspirin 81mg  BID DME needed: none PCP: Cardio: Clearance received TXA: IV Allergies: statins Anesthesia Concerns: none BMI: 39.9 Last HgbA1c: Not diabetic   Other: - No hx of VTE or cancer - oxycodone, robaxin, tylenol, meloxicam - Hx of stents - hx of left TKA - limited flexion to ~90  Subjective:  Chief Complaint:right knee pain.  HPI: Joshua Stark, 77 y.o. male, has a history of pain and functional disability in the right knee due to arthritis and has failed non-surgical conservative treatments for greater than 12 weeks to includeNSAID's and/or analgesics, corticosteriod injections, and activity modification.  Onset of symptoms was gradual, starting 2 years ago with gradually worsening course since that time. The patient noted no past surgery on the right knee(s).  Patient currently rates pain in the right knee(s) at 8 out of 10 with activity. Patient has worsening of pain with activity and weight bearing, pain that interferes with activities of daily living, and pain with passive range of motion.  Patient has evidence of joint space narrowing by imaging studies. There is no active infection.  Patient Active Problem List   Diagnosis Date Noted   Drug-induced myopathy 10/16/2021   Statin intolerance 07/31/2020   Closed fracture of distal end of left femur (HCC) 09/01/2018   S/P left TKA 08/17/2016   S/P total knee replacement 08/17/2016   Bradycardia 03/19/2014   Aortic ejection murmur 09/18/2013   CKD stage G2/A2, GFR 60-89 and albumin creatinine ratio 30-299 mg/g 05/01/2013   CAD S/P percutaneous coronary angioplasty    Essential hypertension    Hyperlipidemia with target LDL less than 70    Class 2 severe obesity with serious comorbidity and body mass index (BMI) of 39.0 to 39.9 in  adult [facility])    Hyperkalemia 07/27/2011   Low testosterone in male 07/30/2009   Anemia of chronic disease 07/29/2009   CONSTIPATION 07/07/2009   ABDOMINAL PAIN, LEFT LOWER QUADRANT 07/07/2009   Epileptic seizures (HCC) 10/13/2006   Past Medical History:  Diagnosis Date   Arthritis    CAD S/P percutaneous coronary angioplasty 1999   PCI- prox LAD @ D1, SP1 trifurcation.  3.0 mm x 16 mm AVE-GFX BMS   Dyspnea    History of nuclear stress test 08/2010   Treadmill Myoview: 9 minutes, 10 METS --> hypertensive response.  No evidence of ischemia or infarction.  EF 64%   HLD (hyperlipidemia)    Hypertension    Obesity (BMI 30-39.9)    Seizure disorder (HCC)    Seizures (HCC)    hx of years ago     Past Surgical History:  Procedure Laterality Date   CARDIAC CATHETERIZATION  01/20/1998   CORONARY ANGIOPLASTY  01/20/1998   PCI to LAD ,prox LAD at trifurcation for D1and SP1.3.0-x16-mm AVE-GFX bare-metal stent   FEMUR IM NAIL Left 08/23/2018   Procedure: INTRAMEDULLARY (IM) RETROGRADE FEMORAL NAILING;  Surgeon: Durene Romans, MD;  Location: [facility];  Service: Orthopedics;  Laterality: Left;   NM MYOCAR PERF WALL MOTION  Aug 2 ,2012   treadmill myoview exercised 9 minutes, reacing 10 metaboblic equivlents,had ahypertensive reponse .EF64%   TOTAL KNEE ARTHROPLASTY Left 08/17/2016   Procedure: LEFT TOTAL KNEE ARTHROPLASTY;  Surgeon: Durene Romans, MD;  Location: [facility];  Service: Orthopedics;  Laterality: Left;   TRANSTHORACIC ECHOCARDIOGRAM  09/20/2013   normal  LV size and function. EF 60-65%. Normal diastolic function. Mild LA dilation and mild AI.  No evidence of aortic stenosis. No significant MR.    No current facility-administered medications for this encounter.   Current Outpatient Medications  Medication Sig Dispense Refill Last Dose   amLODipine (NORVASC) 5 MG tablet Take 2 tablets (10 mg total) by mouth daily. 30 tablet 10    Ascorbic Acid (VITAMIN C) 500 MG CAPS Take 500 mg by mouth  daily.      aspirin EC 81 MG tablet Take 81 mg by mouth daily. Swallow whole.      cloNIDine (CATAPRES) 0.1 MG tablet Take 0.1 mg by mouth daily.      Evolocumab (REPATHA) 140 MG/ML SOSY Inject 140 mg into the skin every 14 (fourteen) days.      Garlic 500 MG TABS Take 500 mg by mouth daily.      hydrochlorothiazide (MICROZIDE) 12.5 MG capsule Take 12.5 mg by mouth daily.      Multiple Vitamin (MULTIVITAMIN) capsule Take 1 capsule by mouth daily.       NITROSTAT 0.4 MG SL tablet DISSOLVE 1 TABLET UNDER THE TONGUE FOR CHEST PAIN. MAY REPEAT EVERY UP TO 3 DOSES. IF NO RELIEF, CALL 911** 25 tablet 2    DEPAKOTE 250 MG DR tablet TAKE ONE (1) TABLET BY MOUTH TWO TIMES PER DAY 180 tablet 3    valsartan (DIOVAN) 320 MG tablet Take 320 mg by mouth daily.      Allergies  Allergen Reactions   Fenofibrate Other (See Comments)    Arthritis, cramping    Rosuvastatin Other (See Comments)    REACTION: cramps    Pravastatin Sodium Other (See Comments)    REACTION: cramps   Statins Other (See Comments)    Cramping     Social History   Tobacco Use   Smoking status: Never   Smokeless tobacco: Never  Substance Use Topics   Alcohol use: No    Alcohol/week: 0.0 standard drinks of alcohol    Family History  Problem Relation Age of Onset   Hypertension Mother    Cancer Father        lung and prostate     Review of Systems  Constitutional:  Negative for chills and fever.  Respiratory:  Negative for cough and shortness of breath.   Cardiovascular:  Negative for chest pain.  Gastrointestinal:  Negative for nausea and vomiting.  Musculoskeletal:  Positive for arthralgias.     Objective:  Physical Exam Well nourished and well developed. General: Alert and oriented x3, cooperative and pleasant, no acute distress. Head: normocephalic, atraumatic, neck supple. Eyes: EOMI.  Musculoskeletal: Right knee exam: No palpable effusion, warmth erythema He lacks 5 degrees of full  extension and flexes to greater than 100 degrees Left knee exam: Well-healed surgical incision lacking 5 degrees of extension and flexing over 90 degrees with terminal tightness No lower extremity edema, erythema or chronic skin changes.  Calves soft and nontender. Motor function intact in LE. Strength 5/5 LE bilaterally. Neuro: Distal pulses 2+. Sensation to light touch intact in LE.  Vital signs in last 24 hours:    Labs:   Estimated body mass index is 38.08 kg/m as calculated from the following:   Height as of 12/02/22: 5\' 11"  (1.803 m).   Weight as of 12/02/22: 123.8 kg.   Imaging Review Plain radiographs demonstrate severe degenerative joint disease of the right knee(s). The overall alignment isneutral. The bone quality appears to be  adequate for age and reported activity level.      Assessment/Plan:  End stage arthritis, right knee   The patient history, physical examination, clinical judgment of the provider and imaging studies are consistent with end stage degenerative joint disease of the right knee(s) and total knee arthroplasty is deemed medically necessary. The treatment options including medical management, injection therapy arthroscopy and arthroplasty were discussed at length. The risks and benefits of total knee arthroplasty were presented and reviewed. The risks due to aseptic loosening, infection, stiffness, patella tracking problems, thromboembolic complications and other imponderables were discussed. The patient acknowledged the explanation, agreed to proceed with the plan and consent was signed. Patient is being admitted for inpatient treatment for surgery, pain control, PT, OT, prophylactic antibiotics, VTE prophylaxis, progressive ambulation and ADL's and discharge planning. The patient is planning to be discharged  home.     Patient's anticipated LOS is less than 2 midnights, meeting these requirements: - Younger than 73 - Lives within 1 hour of care - Has  a competent adult at home to recover with post-op recover - NO history of  - Chronic pain requiring opiods  - Diabetes  - Coronary Artery Disease  - Heart failure  - Heart attack  - Stroke  - DVT/VTE  - Cardiac arrhythmia  - Respiratory Failure/COPD  - Renal failure  - Anemia  - Advanced Liver disease  Rosalene Billings, PA-C Orthopedic Surgery [facility] [place] [phone]

## 2022-12-14 NOTE — Plan of Care (Signed)
  Problem: Education: Goal: Knowledge of General Education information will improve Description: Including pain rating scale, medication(s)/side effects and non-pharmacologic comfort measures Outcome: Progressing   Problem: Activity: Goal: Risk for activity intolerance will decrease Outcome: Progressing   Problem: Nutrition: Goal: Adequate nutrition will be maintained Outcome: Progressing   Problem: Elimination: Goal: Will not experience complications related to bowel motility Outcome: Progressing   Problem: Pain Management: Goal: General experience of comfort will improve Outcome: Progressing   Problem: Skin Integrity: Goal: Risk for impaired skin integrity will decrease Outcome: Progressing   Problem: Education: Goal: Knowledge of the prescribed therapeutic regimen will improve Outcome: Progressing   Problem: Activity: Goal: Ability to avoid complications of mobility impairment will improve Outcome: Progressing   Problem: Clinical Measurements: Goal: Postoperative complications will be avoided or minimized Outcome: Progressing   Problem: Pain Management: Goal: Pain level will decrease with appropriate interventions Outcome: Progressing   Problem: Skin Integrity: Goal: Will show signs of wound healing Outcome: Progressing

## 2022-12-14 NOTE — Transfer of Care (Signed)
Immediate Anesthesia Transfer of Care Note  Patient: Joshua Stark  Procedure(s) Performed: TOTAL KNEE ARTHROPLASTY (Right: Knee)  Patient Location: PACU  Anesthesia Type:Spinal  Level of Consciousness: drowsy  Airway & Oxygen Therapy: Patient Spontanous Breathing and Patient connected to nasal cannula oxygen  Post-op Assessment: Report given to RN and Post -op Vital signs reviewed and stable  Post vital signs: Reviewed and stable  Last Vitals:  Vitals Value Taken Time  BP 99/53 12/14/22 1143  Temp    Pulse 46 12/14/22 1144  Resp 12 12/14/22 1144  SpO2 99 % 12/14/22 1144  Vitals shown include unfiled device data.  Last Pain:  Vitals:   12/14/22 0935  TempSrc:   PainSc: 0-No pain         Complications: No notable events documented.

## 2022-12-14 NOTE — Anesthesia Procedure Notes (Signed)
Spinal  Patient location during procedure: OR Start time: 12/14/2022 10:02 AM End time: 12/14/2022 10:06 AM Reason for block: surgical anesthesia Staffing Performed: anesthesiologist  Anesthesiologist: Collene Schlichter, MD Performed by: Collene Schlichter, MD Authorized by: Collene Schlichter, MD   Preanesthetic Checklist Completed: patient identified, IV checked, risks and benefits discussed, surgical consent, monitors and equipment checked, pre-op evaluation and timeout performed Spinal Block Patient position: sitting Prep: DuraPrep and site prepped and draped Patient monitoring: continuous pulse ox and blood pressure Approach: midline Location: L3-4 Injection technique: single-shot Needle Needle type: Pencan  Needle gauge: 24 G Assessment Events: CSF return Additional Notes Functioning IV was confirmed and monitors were applied. Sterile prep and drape, including hand hygiene, mask and sterile gloves were used. The patient was positioned and the spine was prepped. The skin was anesthetized with lidocaine.  Free flow of clear CSF was obtained prior to injecting local anesthetic into the CSF.  The spinal needle aspirated freely following injection.  The needle was carefully withdrawn.  The patient tolerated the procedure well. Consent was obtained prior to procedure with all questions answered and concerns addressed. Risks including but not limited to bleeding, infection, nerve damage, paralysis, failed block, inadequate analgesia, allergic reaction, high spinal, itching and headache were discussed and the patient wished to proceed.   Arrie Aran, MD

## 2022-12-14 NOTE — Op Note (Signed)
NAME:  Joshua Stark                      MEDICAL RECORD NO.:  [id number]                             FACILITY:  [facility]      PHYSICIAN:  Madlyn Frankel. Charlann Boxer, M.D.  DATE OF BIRTH:  Jul 05, 1945      DATE OF PROCEDURE:  12/14/2022                                     OPERATIVE REPORT         PREOPERATIVE DIAGNOSIS:  Right knee osteoarthritis.      POSTOPERATIVE DIAGNOSIS:  Right knee osteoarthritis.      FINDINGS:  The patient was noted to have complete loss of cartilage and   bone-on-bone arthritis with associated osteophytes in the medial and patellofemoral compartments of   the knee with a significant synovitis and associated effusion.  The patient had failed months of conservative treatment including medications, injection therapy, activity modification.     PROCEDURE:  Right total knee replacement.      COMPONENTS USED:  DePuy Attune FB CR MS knee   system, a size 8 femur, 7 tibia, size 6 mm CR MS AOX insert, and 38 anatomic patellar   button.      SURGEON:  Madlyn Frankel. Charlann Boxer, M.D.      ASSISTANT:  Rosalene Billings, PA-C.      ANESTHESIA:  Regional and Spinal.      SPECIMENS:  None.      COMPLICATION:  None.      DRAINS:  None.  EBL: <200 cc      TOURNIQUET TIME:  33 min at 225 mmHg     The patient was stable to the recovery room.      INDICATION FOR PROCEDURE:  Joshua Stark is a 77 y.o. male patient of   mine.  The patient had been seen, evaluated, and treated for months conservatively in the   office with medication, activity modification, and injections.  The patient had   radiographic changes of bone-on-bone arthritis with endplate sclerosis and osteophytes noted.  Based on the radiographic changes and failed conservative measures, the patient   decided to proceed with definitive treatment, total knee replacement.  Risks of infection, DVT, component failure, need for revision surgery, neurovascular injury were reviewed in the office setting.  The postop course was  reviewed stressing the efforts to maximize post-operative satisfaction and function.  Consent was obtained for benefit of pain   relief.      PROCEDURE IN DETAIL:  The patient was brought to the operative theater.   Once adequate anesthesia, preoperative antibiotics, 2 gm of Ancef,1 gm of Tranexamic Acid, and 10 mg of Decadron administered, the patient was positioned supine with a right thigh tourniquet placed.  The  right lower extremity was prepped and draped in sterile fashion.  A time-   out was performed identifying the patient, planned procedure, and the appropriate extremity.      The right lower extremity was placed in the [facility] leg holder.  The leg was   exsanguinated, tourniquet elevated to 225 mmHg.  A midline incision was   made followed by median parapatellar arthrotomy.  Following initial   exposure, attention was  first directed to the patella.  Precut   measurement was noted to be 26 mm.  I resected down to 14 mm and used a   38 anatomic patellar button to restore patellar height as well as cover the cut surface.      The lug holes were drilled and a metal shim was placed to protect the   patella from retractors and saw blade during the procedure.      At this point, attention was now directed to the femur.  The femoral   canal was opened with a drill, irrigated to try to prevent fat emboli.  An   intramedullary rod was passed at 5 degrees valgus, 9 mm of bone was   resected off the distal femur.  Following this resection, the tibia was   subluxated anteriorly.  Using the extramedullary guide, 2 mm of bone was resected off   the proximal medial tibia.  We confirmed the gap would be   stable medially and laterally with a size 5 spacer block as well as confirmed that the tibial cut was perpendicular in the coronal plane, checking with an alignment rod.      Once this was done, I sized the femur to be a size 8 in the anterior-   posterior dimension, chose a standard component  based on medial and   lateral dimension.  The size 8 rotation block was then pinned in   position anterior referenced using the C-clamp to set rotation.  The   anterior, posterior, and  chamfer cuts were made without difficulty nor   notching making certain that I was along the anterior cortex to help   with flexion gap stability.      The final box cut was made off the lateral aspect of distal femur.      At this point, the tibia was sized to be a size 7.  The size 7 tray was   then pinned in position through the medial third of the tubercle,   drilled, and keel punched.  Trial reduction was now carried with a 8 femur,  7 tibia, a size 6 mm CR MS insert, and the 38 anatomic patella botton.  The knee was brought to full extension with good flexion stability with the patella   tracking through the trochlea without application of pressure.  Given   all these findings the trial components removed.  Final components were   opened and cement was mixed.  The knee was irrigated with normal saline solution and pulse lavage.  The synovial lining was   then injected with 30 cc of 0.25% Marcaine with epinephrine, 1 cc of Toradol and 30 cc of NS for a total of 61 cc.     Final implants were then cemented onto cleaned and dried cut surfaces of bone with the knee brought to extension with a size 6 mm CR MS trial insert.      Once the cement had fully cured, excess cement was removed   throughout the knee.  I confirmed that I was satisfied with the range of   motion and stability, and the final size 6 mm CR MS AOX insert was chosen.  It was   placed into the knee.      The tourniquet had been let down at 33 minutes.  No significant   hemostasis was required.  The extensor mechanism was then reapproximated using #1 Vicryl and #1 Stratafix sutures with the knee   in flexion.  The   remaining wound was closed with 2-0 Vicryl and running 4-0 Monocryl.   The knee was cleaned, dried, dressed sterilely using  Dermabond and   Aquacel dressing.  The patient was then   brought to recovery room in stable condition, tolerating the procedure   well.   Please note that Physician Assistant, Rosalene Billings, PA-C was present for the entirety of the case, and was utilized for pre-operative positioning, peri-operative retractor management, general facilitation of the procedure and for primary wound closure at the end of the case.              Madlyn Frankel Charlann Boxer, M.D.    12/14/2022 8:39 AM

## 2022-12-14 NOTE — Evaluation (Addendum)
Physical Therapy Evaluation Patient Details Name: Joshua Stark MRN: [id number] DOB: Jul 26, 1945 Today's Date: 12/14/2022  History of Present Illness  77 y.o. male admitted 12/14/22 for R TKA. PMH: L TKA, CKD, CAD, HTN, obesity, seizure, L femur IM nail 2020.  Clinical Impression  Pt is s/p TKA resulting in the deficits listed below (see PT Problem List). Pt ambulated 51' with RW, no loss of balance. Initiated TKA HEP. Good progress expected.  Pt will benefit from acute skilled PT to increase their independence and safety with mobility to allow discharge.          If plan is discharge home, recommend the following: A little help with walking and/or transfers;A little help with bathing/dressing/bathroom;Assistance with cooking/housework;Assist for transportation;Help with stairs or ramp for entrance   Can travel by private vehicle        Equipment Recommendations None recommended by PT  Recommendations for Other Services       Functional Status Assessment Patient has had a recent decline in their functional status and demonstrates the ability to make significant improvements in function in a reasonable and predictable amount of time.     Precautions / Restrictions   Fall, knee. REviewed no pillow under knee.  WBAT RLE.      Mobility  Bed Mobility Overal bed mobility: Needs Assistance Bed Mobility: Supine to Sit     Supine to sit: HOB elevated, Used rails, Min assist     General bed mobility comments: min A to raise trunk, HOB up, used rails    Transfers Overall transfer level: Needs assistance Equipment used: Rolling walker (2 wheels) Transfers: Sit to/from Stand Sit to Stand: Min assist, From elevated surface           General transfer comment: VCs hand placement    Ambulation/Gait Ambulation/Gait assistance: Contact guard assist Gait Distance (Feet): 65 Feet Assistive device: Rolling walker (2 wheels) Gait Pattern/deviations: Step-to pattern, Decreased  step length - right, Decreased step length - left Gait velocity: decr     General Gait Details: VCs sequencing, no loss of balance, VCs to lift head  Stairs            Wheelchair Mobility     Tilt Bed    Modified Rankin (Stroke Patients Only)       Balance Overall balance assessment: Modified Independent                                           Pertinent Vitals/Pain Pain Assessment Pain Assessment: 0-10 Pain Score: 5  Pain Location: R knee Pain Descriptors / Indicators: Sore Pain Intervention(s): Limited activity within patient's tolerance, Monitored during session, Ice applied, RN gave pain meds during session, Repositioned    Home Living Family/patient expects to be discharged to:: Private residence Living Arrangements: Spouse/significant other Available Help at Discharge: Available 24 hours/day;Family Type of Home: House Home Access: Ramped entrance       Home Layout: Two level;Able to live on main level with bedroom/bathroom Home Equipment: Rolling Walker (2 wheels);BSC/3in1;Cane - single point      Prior Function Prior Level of Function : Independent/Modified Independent;Driving             Mobility Comments: walks without AD, denies falls in past 6 months ADLs Comments: independent     Extremity/Trunk Assessment   Upper Extremity Assessment Upper Extremity Assessment: Overall WFL for tasks assessed  Lower Extremity Assessment Lower Extremity Assessment: RLE deficits/detail RLE Deficits / Details: SLR 2/5, knee 5-45* AAROM RLE Sensation: WNL RLE Coordination: WNL    Cervical / Trunk Assessment Cervical / Trunk Assessment: Normal  Communication   Communication Communication: No apparent difficulties  Cognition Arousal: Alert Behavior During Therapy: WFL for tasks assessed/performed Overall Cognitive Status: Within Functional Limits for tasks assessed                                           General Comments      Exercises Total Joint Exercises Ankle Circles/Pumps: AROM, Both, 15 reps, Supine Heel Slides: AAROM, Right, 5 reps, Supine   Assessment/Plan    PT Assessment Patient needs continued PT services  PT Problem List Decreased range of motion;Decreased strength;Decreased activity tolerance;Decreased mobility;Pain;Obesity       PT Treatment Interventions Gait training;Functional mobility training;Therapeutic activities;Therapeutic exercise;Patient/family education    PT Goals (Current goals can be found in the Care Plan section)  Acute Rehab PT Goals Patient Stated Goal: to walk farther PT Goal Formulation: With patient/family Time For Goal Achievement: 12/21/22 Potential to Achieve Goals: Good    Frequency 7X/week     Co-evaluation               AM-PAC PT "6 Clicks" Mobility  Outcome Measure Help needed turning from your back to your side while in a flat bed without using bedrails?: A Little Help needed moving from lying on your back to sitting on the side of a flat bed without using bedrails?: A Little Help needed moving to and from a bed to a chair (including a wheelchair)?: A Little Help needed standing up from a chair using your arms (e.g., wheelchair or bedside chair)?: A Lot Help needed to walk in hospital room?: A Little Help needed climbing 3-5 steps with a railing? : A Lot 6 Click Score: 16    End of Session Equipment Utilized During Treatment: Gait belt Activity Tolerance: Patient tolerated treatment well Patient left: in chair;with call bell/phone within reach;with family/visitor present;with chair alarm set Nurse Communication: Mobility status PT Visit Diagnosis: Difficulty in walking, not elsewhere classified (R26.2);Pain Pain - Right/Left: Right Pain - part of body: Knee    Time: 5093-2671 PT Time Calculation (min) (ACUTE ONLY): 28 min   Charges:   PT Evaluation $PT Eval Moderate Complexity: 1 Mod PT Treatments $Gait  Training: 8-22 mins PT General Charges $$ ACUTE PT VISIT: 1 Visit         Tamala Ser PT 12/14/2022  Acute Rehabilitation Services  Office [phone]

## 2022-12-14 NOTE — Interval H&P Note (Signed)
History and Physical Interval Note:  12/14/2022 8:38 AM  Joshua Stark  has presented today for surgery, with the diagnosis of Right knee osteoarthritis.  The various methods of treatment have been discussed with the patient and family. After consideration of risks, benefits and other options for treatment, the patient has consented to  Procedure(s): TOTAL KNEE ARTHROPLASTY (Right) as a surgical intervention.  The patient's history has been reviewed, patient examined, no change in status, stable for surgery.  I have reviewed the patient's chart and labs.  Questions were answered to the patient's satisfaction.     Shelda Pal

## 2022-12-14 NOTE — Care Plan (Signed)
Ortho Bundle Case Management Note  Patient Details  Name: Joshua Stark MRN: [id number] Date of Birth: 12-May-1945                  R TKA on 12/14/22.  DCP: Home with wife and maybe kids.  DME: No needs. Has RW and cane.  PT: EO 11/22   DME Arranged:  N/A DME Agency:       Additional Comments: Please contact me with any questions of if this plan should need to change.    Despina Pole, CCM Case Manager, [facility] [phone] 12/14/2022, 8:46 AM

## 2022-12-15 ENCOUNTER — Other Ambulatory Visit (HOSPITAL_COMMUNITY): Payer: Self-pay

## 2022-12-15 ENCOUNTER — Encounter (HOSPITAL_COMMUNITY): Payer: Self-pay | Admitting: Orthopedic Surgery

## 2022-12-15 DIAGNOSIS — N182 Chronic kidney disease, stage 2 (mild): Secondary | ICD-10-CM | POA: Diagnosis not present

## 2022-12-15 DIAGNOSIS — Z79899 Other long term (current) drug therapy: Secondary | ICD-10-CM | POA: Diagnosis not present

## 2022-12-15 DIAGNOSIS — I2510 Atherosclerotic heart disease of native coronary artery without angina pectoris: Secondary | ICD-10-CM | POA: Diagnosis not present

## 2022-12-15 DIAGNOSIS — Z96652 Presence of left artificial knee joint: Secondary | ICD-10-CM | POA: Diagnosis not present

## 2022-12-15 DIAGNOSIS — Z7982 Long term (current) use of aspirin: Secondary | ICD-10-CM | POA: Diagnosis not present

## 2022-12-15 DIAGNOSIS — I129 Hypertensive chronic kidney disease with stage 1 through stage 4 chronic kidney disease, or unspecified chronic kidney disease: Secondary | ICD-10-CM | POA: Diagnosis not present

## 2022-12-15 DIAGNOSIS — M1711 Unilateral primary osteoarthritis, right knee: Secondary | ICD-10-CM | POA: Diagnosis not present

## 2022-12-15 LAB — BASIC METABOLIC PANEL
Anion gap: 9 (ref 5–15)
BUN: 50 mg/dL — ABNORMAL HIGH (ref 8–23)
CO2: 21 mmol/L — ABNORMAL LOW (ref 22–32)
Calcium: 9.1 mg/dL (ref 8.9–10.3)
Chloride: 103 mmol/L (ref 98–111)
Creatinine, Ser: 1.81 mg/dL — ABNORMAL HIGH (ref 0.61–1.24)
GFR, Estimated: 38 mL/min — ABNORMAL LOW (ref >60)
Glucose, Bld: 164 mg/dL — ABNORMAL HIGH (ref 70–99)
Potassium: 5.8 mmol/L — ABNORMAL HIGH (ref 3.5–5.1)
Sodium: 133 mmol/L — ABNORMAL LOW (ref 135–145)

## 2022-12-15 LAB — POTASSIUM: Potassium: 5.5 mmol/L — ABNORMAL HIGH (ref 3.5–5.1)

## 2022-12-15 LAB — CBC
HCT: 36.4 % — ABNORMAL LOW (ref 39.0–52.0)
Hemoglobin: 11.6 g/dL — ABNORMAL LOW (ref 13.0–17.0)
MCH: 31.4 pg (ref 26.0–34.0)
MCHC: 31.9 g/dL (ref 30.0–36.0)
MCV: 98.4 fL (ref 80.0–100.0)
Platelets: 167 10*3/uL (ref 150–400)
RBC: 3.7 MIL/uL — ABNORMAL LOW (ref 4.22–5.81)
RDW: 12.2 % (ref 11.5–15.5)
WBC: 10.9 10*3/uL — ABNORMAL HIGH (ref 4.0–10.5)
nRBC: 0 % (ref 0.0–0.2)

## 2022-12-15 MED ORDER — CEFADROXIL 500 MG PO CAPS
500.0000 mg | ORAL_CAPSULE | Freq: Two times a day (BID) | ORAL | 0 refills | Status: AC
  Filled 2022-12-15: qty 14, 7d supply, fill #0

## 2022-12-15 MED ORDER — METHOCARBAMOL 500 MG PO TABS
500.0000 mg | ORAL_TABLET | Freq: Four times a day (QID) | ORAL | 2 refills | Status: AC | PRN
  Filled 2022-12-15: qty 40, 10d supply, fill #0

## 2022-12-15 MED ORDER — POLYETHYLENE GLYCOL 3350 17 GM/SCOOP PO POWD
17.0000 g | Freq: Two times a day (BID) | ORAL | 0 refills | Status: AC
  Filled 2022-12-15: qty 238, 7d supply, fill #0

## 2022-12-15 MED ORDER — ASPIRIN 81 MG PO CHEW
81.0000 mg | CHEWABLE_TABLET | Freq: Two times a day (BID) | ORAL | 0 refills | Status: AC
  Filled 2022-12-15: qty 56, 28d supply, fill #0

## 2022-12-15 MED ORDER — SENNA 8.6 MG PO TABS
2.0000 | ORAL_TABLET | Freq: Every day | ORAL | 0 refills | Status: AC
  Filled 2022-12-15: qty 28, 14d supply, fill #0

## 2022-12-15 MED ORDER — OXYCODONE HCL 5 MG PO TABS
5.0000 mg | ORAL_TABLET | ORAL | 0 refills | Status: AC | PRN
  Filled 2022-12-15: qty 42, 7d supply, fill #0

## 2022-12-15 NOTE — Progress Notes (Signed)
Subjective: 1 Day Post-Op Procedure(s) (LRB): TOTAL KNEE ARTHROPLASTY (Right) Patient reports pain as mild.   Patient seen in rounds with Dr. Charlann Boxer. Patient is well, and has had no acute complaints or problems. No acute events overnight. Foley catheter removed. Patient ambulated 65 feet with PT.  We will continue therapy today.   Objective: Vital signs in last 24 hours: Temp:  [97.3 F (36.3 C)-98.2 F (36.8 C)] 97.5 F (36.4 C) (11/20 0617) Pulse Rate:  [46-71] 71 (11/20 0617) Resp:  [10-20] 17 (11/20 0617) BP: (99-178)/(41-86) 166/74 (11/20 0617) SpO2:  [96 %-100 %] 98 % (11/20 0617) Weight:  [123.8 kg] 123.8 kg (11/19 0744)  Intake/Output from previous day:  Intake/Output Summary (Last 24 hours) at 12/15/2022 0743 Last data filed at 12/15/2022 0617 Gross per 24 hour  Intake 1140.81 ml  Output 1030 ml  Net 110.81 ml     Intake/Output this shift: No intake/output data recorded.  Labs: Recent Labs    12/15/22 0349  HGB 11.6*   Recent Labs    12/15/22 0349  WBC 10.9*  RBC 3.70*  HCT 36.4*  PLT 167   Recent Labs    12/15/22 0349  NA 133*  K 5.8*  CL 103  CO2 21*  BUN 50*  CREATININE 1.81*  GLUCOSE 164*  CALCIUM 9.1   No results for input(s): "LABPT", "INR" in the last 72 hours.  Exam: General - Patient is Alert and Oriented Extremity - Neurologically intact Sensation intact distally Intact pulses distally Dorsiflexion/Plantar flexion intact Dressing - dressing C/D/I Motor Function - intact, moving foot and toes well on exam.   Past Medical History:  Diagnosis Date   Arthritis    CAD S/P percutaneous coronary angioplasty 1999   PCI- prox LAD @ D1, SP1 trifurcation.  3.0 mm x 16 mm AVE-GFX BMS   Dyspnea    History of nuclear stress test 08/2010   Treadmill Myoview: 9 minutes, 10 METS --> hypertensive response.  No evidence of ischemia or infarction.  EF 64%   HLD (hyperlipidemia)    Hypertension    Obesity (BMI 30-39.9)    Seizure  disorder (HCC)    Seizures (HCC)    hx of years ago     Assessment/Plan: 1 Day Post-Op Procedure(s) (LRB): TOTAL KNEE ARTHROPLASTY (Right) Principal Problem:   S/P total knee arthroplasty, right  Estimated body mass index is 38.08 kg/m as calculated from the following:   Height as of this encounter: 5\' 11"  (1.803 m).   Weight as of this encounter: 123.8 kg. Advance diet Up with therapy D/C IV fluids   Patient's anticipated LOS is less than 2 midnights, meeting these requirements: - Younger than 75 - Lives within 1 hour of care - Has a competent adult at home to recover with post-op recover - NO history of  - Chronic pain requiring opiods  - Diabetes  - Coronary Artery Disease  - Heart failure  - Heart attack  - Stroke  - DVT/VTE  - Cardiac arrhythmia  - Respiratory Failure/COPD  - Renal failure  - Anemia  - Advanced Liver disease     DVT Prophylaxis - Aspirin Weight bearing as tolerated.  Hgb stable at 11.6 this AM. Cr 1.8 - near baseline 1 month ago - no NSAIDs K 5.8 - will repeat to ensure no lab error  Plan is to go Home after hospital stay. Plan for discharge today following 1-2 sessions of PT as long as they are meeting their goals. Patient is  scheduled for OPPT. Follow up in the office in 2 weeks.   Rosalene Billings, PA-C Orthopedic Surgery [phone] 12/15/2022, 7:43 AM

## 2022-12-15 NOTE — Plan of Care (Signed)
  Problem: Education: Goal: Knowledge of General Education information will improve Description: Including pain rating scale, medication(s)/side effects and non-pharmacologic comfort measures Outcome: Adequate for Discharge   Problem: Health Behavior/Discharge Planning: Goal: Ability to manage health-related needs will improve Outcome: Progressing   Problem: Clinical Measurements: Goal: Ability to maintain clinical measurements within normal limits will improve Outcome: Progressing Goal: Will remain free from infection Outcome: Progressing Goal: Diagnostic test results will improve Outcome: Progressing Goal: Respiratory complications will improve Outcome: Adequate for Discharge Goal: Cardiovascular complication will be avoided Outcome: Adequate for Discharge   Problem: Activity: Goal: Risk for activity intolerance will decrease Outcome: Adequate for Discharge   Problem: Nutrition: Goal: Adequate nutrition will be maintained Outcome: Completed/Met   Problem: Coping: Goal: Level of anxiety will decrease Outcome: Progressing   Problem: Elimination: Goal: Will not experience complications related to bowel motility Outcome: Progressing Goal: Will not experience complications related to urinary retention Outcome: Progressing   Problem: Pain Management: Goal: General experience of comfort will improve Outcome: Progressing   Problem: Safety: Goal: Ability to remain free from injury will improve Outcome: Progressing   Problem: Skin Integrity: Goal: Risk for impaired skin integrity will decrease Outcome: Adequate for Discharge   Problem: Education: Goal: Knowledge of the prescribed therapeutic regimen will improve Outcome: Progressing Goal: Individualized Educational Video(s) Outcome: Completed/Met   Problem: Activity: Goal: Ability to avoid complications of mobility impairment will improve Outcome: Adequate for Discharge Goal: Range of joint motion will  improve Outcome: Adequate for Discharge   Problem: Clinical Measurements: Goal: Postoperative complications will be avoided or minimized Outcome: Progressing   Problem: Pain Management: Goal: Pain level will decrease with appropriate interventions Outcome: Adequate for Discharge   Problem: Skin Integrity: Goal: Will show signs of wound healing Outcome: Progressing

## 2022-12-15 NOTE — Plan of Care (Signed)
  Problem: Pain Management: Goal: General experience of comfort will improve Outcome: Progressing   Problem: Pain Management: Goal: General experience of comfort will improve Outcome: Progressing   Problem: Safety: Goal: Ability to remain free from injury will improve Outcome: Progressing

## 2022-12-15 NOTE — Progress Notes (Signed)
Discharge package printed and instructions given to pt and wife. Verbalize understanding. 

## 2022-12-15 NOTE — Progress Notes (Signed)
Discharge meds delivered in secure bag to pt in room by this RN

## 2022-12-15 NOTE — TOC Transition Note (Signed)
Transition of Care [facility]) - CM/SW Discharge Note   Patient Details  Name: Joshua Stark MRN: [id number] Date of Birth: 04-13-1945  Transition of Care [facility]) CM/SW Contact:  Amada Jupiter, LCSW Phone Number: 12/15/2022, 11:15 AM   Clinical Narrative:     Met with pt who confirms he has needed DME in the home.  OPPT already arranged with [facility].  No further TOC needs.  Final next level of care: OP Rehab Barriers to Discharge: No Barriers Identified   Patient Goals and CMS Choice      Discharge Placement                         Discharge Plan and Services Additional resources added to the After Visit Summary for                  DME Arranged: N/A                    Social Determinants of Health (SDOH) Interventions SDOH Screenings   Food Insecurity: No Food Insecurity (12/14/2022)  Housing: Low Risk  (12/14/2022)  Transportation Needs: No Transportation Needs (12/14/2022)  Utilities: Not At Risk (12/14/2022)  Alcohol Screen: Low Risk  (09/29/2022)  Depression (PHQ2-9): Low Risk  (10/28/2022)  Financial Resource Strain: Low Risk  (11/08/2022)   Received from [facility]  Physical Activity: Insufficiently Active (09/29/2022)  Social Connections: Moderately Integrated (09/29/2022)  Stress: No Stress Concern Present (09/29/2022)  Tobacco Use: Low Risk  (12/14/2022)  Health Literacy: Adequate Health Literacy (09/29/2022)     Readmission Risk Interventions     No data to display

## 2022-12-15 NOTE — Progress Notes (Signed)
Physical Therapy Treatment Patient Details Name: Joshua Stark MRN: [id number] DOB: 12/29/1945 Today's Date: 12/15/2022   History of Present Illness 77 y.o. male admitted 12/14/22 for R TKA. PMH: L TKA, CKD, CAD, HTN, obesity, seizure, L femur IM nail 2020.    PT Comments  Pt is progressing well with mobility, he ambulated 140' with RW, no loss of balance. Pt demonstrates good understanding of HEP. He is ready to DC home from a PT standpoint.     If plan is discharge home, recommend the following: A little help with bathing/dressing/bathroom;Assistance with cooking/housework;Assist for transportation;Help with stairs or ramp for entrance   Can travel by private vehicle        Equipment Recommendations  None recommended by PT    Recommendations for Other Services       Precautions / Restrictions Precautions Precautions: Knee;Fall Precaution Comments: reviewed no pillow under knee Restrictions Weight Bearing Restrictions: No Other Position/Activity Restrictions: WBAT     Mobility  Bed Mobility               General bed mobility comments: up in recliner    Transfers Overall transfer level: Needs assistance Equipment used: Rolling walker (2 wheels) Transfers: Sit to/from Stand Sit to Stand: Supervision, From elevated surface           General transfer comment: VCs hand placement    Ambulation/Gait Ambulation/Gait assistance: Supervision Gait Distance (Feet): 140 Feet Assistive device: Rolling walker (2 wheels) Gait Pattern/deviations: Step-to pattern, Decreased step length - right, Decreased step length - left Gait velocity: decr     General Gait Details: VCs sequencing, no loss of balance, VCs to lift head   Stairs             Wheelchair Mobility     Tilt Bed    Modified Rankin (Stroke Patients Only)       Balance Overall balance assessment: Modified Independent                                           Cognition Arousal: Alert Behavior During Therapy: WFL for tasks assessed/performed Overall Cognitive Status: Within Functional Limits for tasks assessed                                          Exercises Total Joint Exercises Ankle Circles/Pumps: AROM, Both, 15 reps, Supine Quad Sets: AROM, Both, 5 reps, Supine Short Arc Quad: AROM, Right, 5 reps, Supine Heel Slides: AAROM, Right, 5 reps, Supine Hip ABduction/ADduction: Right, 5 reps, Supine, AROM Straight Leg Raises: AROM, Right, 5 reps, Supine Long Arc Quad: AAROM, Right, 5 reps, Seated Knee Flexion: AAROM, Right, 10 reps, Seated Goniometric ROM: 5-65* AAROM R knee    General Comments        Pertinent Vitals/Pain Pain Assessment Pain Score: 5  Pain Location: R knee Pain Descriptors / Indicators: Sore Pain Intervention(s): Limited activity within patient's tolerance, Monitored during session, Premedicated before session, Ice applied    Home Living                          Prior Function            PT Goals (current goals can now be found in the care plan section)  Acute Rehab PT Goals 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 PT Goal Formulation: With patient/family Time For Goal Achievement: 12/21/22 Potential to Achieve Goals: Good Progress towards PT goals: Goals met/education completed, patient discharged from PT    Frequency    7X/week      PT Plan      Co-evaluation              AM-PAC PT "6 Clicks" Mobility   Outcome Measure  Help needed turning from your back to your side while in a flat bed without using bedrails?: None Help needed moving from lying on your back to sitting on the side of a flat bed without using bedrails?: A Little Help needed moving to and from a bed to a chair (including a wheelchair)?: None Help needed standing up from a chair using your arms (e.g., wheelchair or bedside chair)?: None Help needed to walk in hospital room?: None Help needed  climbing 3-5 steps with a railing? : A Little 6 Click Score: 22    End of Session Equipment Utilized During Treatment: Gait belt Activity Tolerance: Patient tolerated treatment well Patient left: in chair;with call bell/phone within reach;with family/visitor present;with chair alarm set Nurse Communication: Mobility status PT Visit Diagnosis: Difficulty in walking, not elsewhere classified (R26.2);Pain Pain - Right/Left: Right Pain - part of body: Knee     Time: 0922-0945 PT Time Calculation (min) (ACUTE ONLY): 23 min  Charges:    $Gait Training: 8-22 mins $Therapeutic Exercise: 8-22 mins PT General Charges $$ ACUTE PT VISIT: 1 Visit                     Tamala Ser PT 12/15/2022  Acute Rehabilitation Services  Office [phone]

## 2022-12-16 ENCOUNTER — Other Ambulatory Visit (HOSPITAL_COMMUNITY): Payer: Self-pay

## 2022-12-17 DIAGNOSIS — M25561 Pain in right knee: Secondary | ICD-10-CM | POA: Diagnosis not present

## 2022-12-20 DIAGNOSIS — M25561 Pain in right knee: Secondary | ICD-10-CM | POA: Diagnosis not present

## 2022-12-22 ENCOUNTER — Other Ambulatory Visit (HOSPITAL_COMMUNITY): Payer: Self-pay

## 2022-12-22 ENCOUNTER — Other Ambulatory Visit (HOSPITAL_COMMUNITY): Payer: Self-pay | Admitting: Student

## 2022-12-22 DIAGNOSIS — M25561 Pain in right knee: Secondary | ICD-10-CM | POA: Diagnosis not present

## 2022-12-27 DIAGNOSIS — M25561 Pain in right knee: Secondary | ICD-10-CM | POA: Diagnosis not present

## 2022-12-29 DIAGNOSIS — M25561 Pain in right knee: Secondary | ICD-10-CM | POA: Diagnosis not present

## 2022-12-30 NOTE — Discharge Summary (Signed)
Patient ID: Joshua Stark MRN: [id number] DOB/AGE: 09/30/1945 77 y.o.  Admit date: 12/14/2022 Discharge date: 12/15/2022  Admission Diagnoses:  Right knee osteoarthritis  Discharge Diagnoses:  Principal Problem:   S/P total knee arthroplasty, right   Past Medical History:  Diagnosis Date   Arthritis    CAD S/P percutaneous coronary angioplasty 1999   PCI- prox LAD @ D1, SP1 trifurcation.  3.0 mm x 16 mm AVE-GFX BMS   Dyspnea    History of nuclear stress test 08/2010   Treadmill Myoview: 9 minutes, 10 METS --> hypertensive response.  No evidence of ischemia or infarction.  EF 64%   HLD (hyperlipidemia)    Hypertension    Obesity (BMI 30-39.9)    Seizure disorder (HCC)    Seizures (HCC)    hx of years ago     Surgeries: Procedure(s): TOTAL KNEE ARTHROPLASTY on 12/14/2022   Consultants:   Discharged Condition: Improved  Hospital Course: Joshua Stark is an 77 y.o. male who was admitted 12/14/2022 for operative treatment ofS/P total knee arthroplasty, right. Patient has severe unremitting pain that affects sleep, daily activities, and work/hobbies. After pre-op clearance the patient was taken to the operating room on 12/14/2022 and underwent  Procedure(s): TOTAL KNEE ARTHROPLASTY.    Patient was given perioperative antibiotics:  Anti-infectives (From admission, onward)    Start     Dose/Rate Route Frequency Ordered Stop   12/15/22 0000  cefadroxil (DURICEF) 500 MG capsule        500 mg Oral 2 times daily 12/15/22 0747 12/22/22 2359   12/14/22 1600  ceFAZolin (ANCEF) IVPB 2g/100 mL premix        2 g 200 mL/hr over 30 Minutes Intravenous Every 6 hours 12/14/22 1321 12/15/22 0200   12/14/22 0745  ceFAZolin (ANCEF) IVPB 2g/100 mL premix        2 g 200 mL/hr over 30 Minutes Intravenous On call to O.R. 12/14/22 0740 12/14/22 4098        Patient was given sequential compression devices, early ambulation, and chemoprophylaxis to prevent DVT. Patient worked with PT and  was meeting their goals regarding safe ambulation and transfers.  Patient benefited maximally from hospital stay and there were no complications.    Recent vital signs: No data found.   Recent laboratory studies: No results for input(s): "WBC", "HGB", "HCT", "PLT", "NA", "K", "CL", "CO2", "BUN", "CREATININE", "GLUCOSE", "INR", "CALCIUM" in the last 72 hours.  Invalid input(s): "PT", "2"   Discharge Medications:   Allergies as of 12/15/2022       Reactions   Fenofibrate Other (See Comments)   Arthritis, cramping   Rosuvastatin Other (See Comments)   REACTION: cramps   Pravastatin Sodium Other (See Comments)   REACTION: cramps   Statins Other (See Comments)   Cramping        Medication List     STOP taking these medications    aspirin EC 81 MG tablet Replaced by: Aspirin Low Dose 81 MG chewable tablet       TAKE these medications    amLODipine 5 MG tablet Commonly known as: NORVASC Take 2 tablets (10 mg total) by mouth daily.   Aspirin Low Dose 81 MG chewable tablet Generic drug: aspirin Chew 1 tablet (81 mg total) by mouth 2 (two) times daily for 28 days. Replaces: aspirin EC 81 MG tablet   cloNIDine 0.1 MG tablet Commonly known as: CATAPRES Take 0.1 mg by mouth daily.   Depakote 250 MG DR tablet Generic drug:  divalproex TAKE ONE (1) TABLET BY MOUTH TWO TIMES PER DAY   Garlic 500 MG Tabs Take 500 mg by mouth daily.   hydrochlorothiazide 12.5 MG capsule Commonly known as: MICROZIDE Take 12.5 mg by mouth daily.   methocarbamol 500 MG tablet Commonly known as: ROBAXIN Take 1 tablet (500 mg total) by mouth every 6 (six) hours as needed for muscle spasms.   multivitamin capsule Take 1 capsule by mouth daily.   Nitrostat 0.4 MG SL tablet Generic drug: nitroGLYCERIN DISSOLVE 1 TABLET UNDER THE TONGUE FOR CHEST PAIN. MAY REPEAT EVERY UP TO 3 DOSES. IF NO RELIEF, CALL 911**   oxyCODONE 5 MG immediate release tablet Commonly known as: Oxy  IR/ROXICODONE Take 1 tablet (5 mg total) by mouth every 4 (four) hours as needed for severe pain (pain score 7-10).   polyethylene glycol powder 17 GM/SCOOP powder Commonly known as: GLYCOLAX/MIRALAX Take 17 g mixed in liquid by mouth 2 (two) times daily.   Repatha 140 MG/ML Sosy Generic drug: Evolocumab Inject 140 mg into the skin every 14 (fourteen) days.   valsartan 320 MG tablet Commonly known as: DIOVAN Take 320 mg by mouth daily.   Vitamin C 500 MG Caps Take 500 mg by mouth daily.       ASK your doctor about these medications    cefadroxil 500 MG capsule Commonly known as: DURICEF Take 1 capsule (500 mg total) by mouth 2 (two) times daily for 7 days. Ask about: Should I take this medication?   senna 8.6 MG Tabs tablet Commonly known as: SENOKOT Take 2 tablets (17.2 mg total) by mouth at bedtime for 14 days. Ask about: Should I take this medication?               Discharge Care Instructions  (From admission, onward)           Start     Ordered   12/15/22 0000  Change dressing       Comments: Maintain surgical dressing until follow up in the clinic. If the edges start to pull up, may reinforce with tape. If the dressing is no longer working, may remove and cover with gauze and tape, but must keep the area dry and clean.  Call with any questions or concerns.   12/15/22 0747            Diagnostic Studies: No results found.  Disposition: Discharge disposition: 01-Home or Self Care       Discharge Instructions     Call MD / Call 911   Complete by: As directed    If you experience chest pain or shortness of breath, CALL 911 and be transported to the hospital emergency room.  If you develope a fever above 101 F, pus (white drainage) or increased drainage or redness at the wound, or calf pain, call your surgeon's office.   Change dressing   Complete by: As directed    Maintain surgical dressing until follow up in the clinic. If the edges start  to pull up, may reinforce with tape. If the dressing is no longer working, may remove and cover with gauze and tape, but must keep the area dry and clean.  Call with any questions or concerns.   Constipation Prevention   Complete by: As directed    Drink plenty of fluids.  Prune juice may be helpful.  You may use a stool softener, such as Colace (over the counter) 100 mg twice a day.  Use MiraLax (over  the counter) for constipation as needed.   Diet - low sodium heart healthy   Complete by: As directed    Increase activity slowly as tolerated   Complete by: As directed    Weight bearing as tolerated with assist device (walker, cane, etc) as directed, use it as long as suggested by your surgeon or therapist, typically at least 4-6 weeks.   Post-operative opioid taper instructions:   Complete by: As directed    POST-OPERATIVE OPIOID TAPER INSTRUCTIONS: It is important to wean off of your opioid medication as soon as possible. If you do not need pain medication after your surgery it is ok to stop day one. Opioids include: Codeine, Hydrocodone(Norco, Vicodin), Oxycodone(Percocet, oxycontin) and hydromorphone amongst others.  Long term and even short term use of opiods can cause: Increased pain response Dependence Constipation Depression Respiratory depression And more.  Withdrawal symptoms can include Flu like symptoms Nausea, vomiting And more Techniques to manage these symptoms Hydrate well Eat regular healthy meals Stay active Use relaxation techniques(deep breathing, meditating, yoga) Do Not substitute Alcohol to help with tapering If you have been on opioids for less than two weeks and do not have pain than it is ok to stop all together.  Plan to wean off of opioids This plan should start within one week post op of your joint replacement. Maintain the same interval or time between taking each dose and first decrease the dose.  Cut the total daily intake of opioids by one tablet  each day Next start to increase the time between doses. The last dose that should be eliminated is the evening dose.      TED hose   Complete by: As directed    Use stockings (TED hose) for 2 weeks on both leg(s).  You may remove them at night for sleeping.        Follow-up Information     Durene Romans, MD. Go on 12/29/2022.   Specialty: Orthopedic Surgery Why: You are scheduled for first post op appt on Wednesday December 4 at 8:00am. Contact information: [address] [place] [address] [phone]         Zorita Pang.. Go on 12/17/2022.   Why: You are scheduled for physical therapy eval on Friday November 22 at 9:30am. Contact information: [address] [place] [address] [phone]                  Signed: Cassandria Anger 12/30/2022, 7:16 AM

## 2023-01-03 DIAGNOSIS — M25561 Pain in right knee: Secondary | ICD-10-CM | POA: Diagnosis not present

## 2023-01-05 DIAGNOSIS — M25561 Pain in right knee: Secondary | ICD-10-CM | POA: Diagnosis not present

## 2023-01-11 DIAGNOSIS — M25561 Pain in right knee: Secondary | ICD-10-CM | POA: Diagnosis not present

## 2023-01-13 DIAGNOSIS — M25561 Pain in right knee: Secondary | ICD-10-CM | POA: Diagnosis not present

## 2023-01-17 DIAGNOSIS — M25561 Pain in right knee: Secondary | ICD-10-CM | POA: Diagnosis not present

## 2023-01-21 DIAGNOSIS — M25561 Pain in right knee: Secondary | ICD-10-CM | POA: Diagnosis not present

## 2023-01-28 DIAGNOSIS — M25561 Pain in right knee: Secondary | ICD-10-CM | POA: Diagnosis not present

## 2023-01-31 DIAGNOSIS — M25561 Pain in right knee: Secondary | ICD-10-CM | POA: Diagnosis not present

## 2023-02-04 DIAGNOSIS — Z96651 Presence of right artificial knee joint: Secondary | ICD-10-CM | POA: Diagnosis not present

## 2023-02-04 DIAGNOSIS — Z471 Aftercare following joint replacement surgery: Secondary | ICD-10-CM | POA: Diagnosis not present

## 2023-02-04 DIAGNOSIS — M25561 Pain in right knee: Secondary | ICD-10-CM | POA: Diagnosis not present

## 2023-02-07 DIAGNOSIS — M25561 Pain in right knee: Secondary | ICD-10-CM | POA: Diagnosis not present

## 2023-02-10 DIAGNOSIS — M25561 Pain in right knee: Secondary | ICD-10-CM | POA: Diagnosis not present

## 2023-02-14 DIAGNOSIS — M25561 Pain in right knee: Secondary | ICD-10-CM | POA: Diagnosis not present

## 2023-02-18 DIAGNOSIS — M25561 Pain in right knee: Secondary | ICD-10-CM | POA: Diagnosis not present

## 2023-02-22 DIAGNOSIS — M25561 Pain in right knee: Secondary | ICD-10-CM | POA: Diagnosis not present

## 2023-03-19 ENCOUNTER — Other Ambulatory Visit (HOSPITAL_COMMUNITY): Payer: Self-pay

## 2023-04-05 DIAGNOSIS — I10 Essential (primary) hypertension: Secondary | ICD-10-CM | POA: Diagnosis not present

## 2023-04-05 DIAGNOSIS — I2510 Atherosclerotic heart disease of native coronary artery without angina pectoris: Secondary | ICD-10-CM | POA: Diagnosis not present

## 2023-04-05 DIAGNOSIS — Z9861 Coronary angioplasty status: Secondary | ICD-10-CM | POA: Diagnosis not present

## 2023-04-05 DIAGNOSIS — I351 Nonrheumatic aortic (valve) insufficiency: Secondary | ICD-10-CM | POA: Diagnosis not present

## 2023-04-05 DIAGNOSIS — E782 Mixed hyperlipidemia: Secondary | ICD-10-CM | POA: Diagnosis not present

## 2023-04-05 DIAGNOSIS — Z96652 Presence of left artificial knee joint: Secondary | ICD-10-CM | POA: Diagnosis not present

## 2023-04-05 DIAGNOSIS — Z789 Other specified health status: Secondary | ICD-10-CM | POA: Diagnosis not present

## 2023-04-19 DIAGNOSIS — D128 Benign neoplasm of rectum: Secondary | ICD-10-CM | POA: Diagnosis not present

## 2023-04-19 DIAGNOSIS — Z860101 Personal history of adenomatous and serrated colon polyps: Secondary | ICD-10-CM | POA: Diagnosis not present

## 2023-04-19 DIAGNOSIS — Z888 Allergy status to other drugs, medicaments and biological substances status: Secondary | ICD-10-CM | POA: Diagnosis not present

## 2023-04-19 DIAGNOSIS — G4733 Obstructive sleep apnea (adult) (pediatric): Secondary | ICD-10-CM | POA: Diagnosis not present

## 2023-04-19 DIAGNOSIS — K5730 Diverticulosis of large intestine without perforation or abscess without bleeding: Secondary | ICD-10-CM | POA: Diagnosis not present

## 2023-04-19 DIAGNOSIS — I351 Nonrheumatic aortic (valve) insufficiency: Secondary | ICD-10-CM | POA: Diagnosis not present

## 2023-04-19 DIAGNOSIS — D124 Benign neoplasm of descending colon: Secondary | ICD-10-CM | POA: Diagnosis not present

## 2023-04-19 DIAGNOSIS — E782 Mixed hyperlipidemia: Secondary | ICD-10-CM | POA: Diagnosis not present

## 2023-04-19 DIAGNOSIS — Z79899 Other long term (current) drug therapy: Secondary | ICD-10-CM | POA: Diagnosis not present

## 2023-04-19 DIAGNOSIS — N183 Chronic kidney disease, stage 3 unspecified: Secondary | ICD-10-CM | POA: Diagnosis not present

## 2023-04-19 DIAGNOSIS — I2510 Atherosclerotic heart disease of native coronary artery without angina pectoris: Secondary | ICD-10-CM | POA: Diagnosis not present

## 2023-04-19 DIAGNOSIS — Z1211 Encounter for screening for malignant neoplasm of colon: Secondary | ICD-10-CM | POA: Diagnosis not present

## 2023-04-19 DIAGNOSIS — D122 Benign neoplasm of ascending colon: Secondary | ICD-10-CM | POA: Diagnosis not present

## 2023-04-19 DIAGNOSIS — I10 Essential (primary) hypertension: Secondary | ICD-10-CM | POA: Diagnosis not present

## 2023-04-19 DIAGNOSIS — K640 First degree hemorrhoids: Secondary | ICD-10-CM | POA: Diagnosis not present

## 2023-04-19 DIAGNOSIS — D123 Benign neoplasm of transverse colon: Secondary | ICD-10-CM | POA: Diagnosis not present

## 2023-04-19 DIAGNOSIS — I129 Hypertensive chronic kidney disease with stage 1 through stage 4 chronic kidney disease, or unspecified chronic kidney disease: Secondary | ICD-10-CM | POA: Diagnosis not present

## 2023-04-27 DIAGNOSIS — Z96651 Presence of right artificial knee joint: Secondary | ICD-10-CM | POA: Diagnosis not present

## 2023-05-03 DIAGNOSIS — N1831 Chronic kidney disease, stage 3a: Secondary | ICD-10-CM | POA: Diagnosis not present

## 2023-05-03 DIAGNOSIS — R5383 Other fatigue: Secondary | ICD-10-CM | POA: Diagnosis not present

## 2023-05-03 DIAGNOSIS — I10 Essential (primary) hypertension: Secondary | ICD-10-CM | POA: Diagnosis not present

## 2023-05-03 DIAGNOSIS — I129 Hypertensive chronic kidney disease with stage 1 through stage 4 chronic kidney disease, or unspecified chronic kidney disease: Secondary | ICD-10-CM | POA: Diagnosis not present

## 2023-05-03 DIAGNOSIS — R251 Tremor, unspecified: Secondary | ICD-10-CM | POA: Diagnosis not present

## 2023-05-11 DIAGNOSIS — I10 Essential (primary) hypertension: Secondary | ICD-10-CM | POA: Diagnosis not present

## 2023-05-11 DIAGNOSIS — E785 Hyperlipidemia, unspecified: Secondary | ICD-10-CM | POA: Diagnosis not present

## 2023-05-11 DIAGNOSIS — I1A0 Resistant hypertension: Secondary | ICD-10-CM | POA: Diagnosis not present

## 2023-08-16 DIAGNOSIS — L821 Other seborrheic keratosis: Secondary | ICD-10-CM | POA: Diagnosis not present

## 2023-08-16 DIAGNOSIS — D1801 Hemangioma of skin and subcutaneous tissue: Secondary | ICD-10-CM | POA: Diagnosis not present

## 2023-08-16 DIAGNOSIS — D229 Melanocytic nevi, unspecified: Secondary | ICD-10-CM | POA: Diagnosis not present

## 2023-08-16 DIAGNOSIS — L570 Actinic keratosis: Secondary | ICD-10-CM | POA: Diagnosis not present

## 2023-08-16 DIAGNOSIS — L814 Other melanin hyperpigmentation: Secondary | ICD-10-CM | POA: Diagnosis not present

## 2023-10-25 ENCOUNTER — Other Ambulatory Visit: Payer: Medicare Other

## 2023-10-28 DIAGNOSIS — I10 Essential (primary) hypertension: Secondary | ICD-10-CM | POA: Diagnosis not present

## 2023-10-28 DIAGNOSIS — G8929 Other chronic pain: Secondary | ICD-10-CM | POA: Diagnosis not present

## 2023-10-28 DIAGNOSIS — R001 Bradycardia, unspecified: Secondary | ICD-10-CM | POA: Diagnosis not present

## 2023-10-28 DIAGNOSIS — R519 Headache, unspecified: Secondary | ICD-10-CM | POA: Diagnosis not present

## 2023-10-28 DIAGNOSIS — R1032 Left lower quadrant pain: Secondary | ICD-10-CM | POA: Diagnosis not present

## 2023-10-28 DIAGNOSIS — R079 Chest pain, unspecified: Secondary | ICD-10-CM | POA: Diagnosis not present

## 2023-10-28 DIAGNOSIS — I459 Conduction disorder, unspecified: Secondary | ICD-10-CM | POA: Diagnosis not present

## 2023-10-28 DIAGNOSIS — I517 Cardiomegaly: Secondary | ICD-10-CM | POA: Diagnosis not present

## 2023-11-01 ENCOUNTER — Encounter: Payer: Medicare Other | Admitting: Family Medicine

## 2023-11-12 DIAGNOSIS — I517 Cardiomegaly: Secondary | ICD-10-CM | POA: Diagnosis not present

## 2023-11-12 DIAGNOSIS — N179 Acute kidney failure, unspecified: Secondary | ICD-10-CM | POA: Diagnosis not present

## 2023-11-12 DIAGNOSIS — M542 Cervicalgia: Secondary | ICD-10-CM | POA: Diagnosis not present

## 2023-11-12 DIAGNOSIS — R0602 Shortness of breath: Secondary | ICD-10-CM | POA: Diagnosis not present

## 2023-11-12 DIAGNOSIS — I459 Conduction disorder, unspecified: Secondary | ICD-10-CM | POA: Diagnosis not present

## 2023-11-12 DIAGNOSIS — R079 Chest pain, unspecified: Secondary | ICD-10-CM | POA: Diagnosis not present

## 2023-11-12 DIAGNOSIS — R0789 Other chest pain: Secondary | ICD-10-CM | POA: Diagnosis not present

## 2023-11-12 DIAGNOSIS — R001 Bradycardia, unspecified: Secondary | ICD-10-CM | POA: Diagnosis not present

## 2023-11-13 DIAGNOSIS — M542 Cervicalgia: Secondary | ICD-10-CM | POA: Diagnosis not present

## 2023-11-13 DIAGNOSIS — R0602 Shortness of breath: Secondary | ICD-10-CM | POA: Diagnosis not present

## 2023-11-13 DIAGNOSIS — R0789 Other chest pain: Secondary | ICD-10-CM | POA: Diagnosis not present

## 2023-11-13 DIAGNOSIS — N179 Acute kidney failure, unspecified: Secondary | ICD-10-CM | POA: Diagnosis not present

## 2023-11-14 DIAGNOSIS — I214 Non-ST elevation (NSTEMI) myocardial infarction: Secondary | ICD-10-CM | POA: Diagnosis not present

## 2023-11-14 DIAGNOSIS — E785 Hyperlipidemia, unspecified: Secondary | ICD-10-CM | POA: Diagnosis not present

## 2023-11-14 DIAGNOSIS — I129 Hypertensive chronic kidney disease with stage 1 through stage 4 chronic kidney disease, or unspecified chronic kidney disease: Secondary | ICD-10-CM | POA: Diagnosis not present

## 2023-11-14 DIAGNOSIS — Z955 Presence of coronary angioplasty implant and graft: Secondary | ICD-10-CM | POA: Diagnosis not present

## 2023-11-14 DIAGNOSIS — I25118 Atherosclerotic heart disease of native coronary artery with other forms of angina pectoris: Secondary | ICD-10-CM | POA: Diagnosis not present

## 2023-11-14 DIAGNOSIS — N189 Chronic kidney disease, unspecified: Secondary | ICD-10-CM | POA: Diagnosis not present

## 2023-11-14 DIAGNOSIS — R7303 Prediabetes: Secondary | ICD-10-CM | POA: Diagnosis not present

## 2023-11-15 DIAGNOSIS — Z01818 Encounter for other preprocedural examination: Secondary | ICD-10-CM | POA: Diagnosis not present

## 2023-11-15 DIAGNOSIS — R079 Chest pain, unspecified: Secondary | ICD-10-CM | POA: Diagnosis not present

## 2023-11-15 DIAGNOSIS — Z9861 Coronary angioplasty status: Secondary | ICD-10-CM | POA: Diagnosis not present

## 2023-11-15 DIAGNOSIS — I371 Nonrheumatic pulmonary valve insufficiency: Secondary | ICD-10-CM | POA: Diagnosis not present

## 2023-11-15 DIAGNOSIS — R918 Other nonspecific abnormal finding of lung field: Secondary | ICD-10-CM | POA: Diagnosis not present

## 2023-11-15 DIAGNOSIS — I200 Unstable angina: Secondary | ICD-10-CM | POA: Diagnosis not present

## 2023-11-15 DIAGNOSIS — I2510 Atherosclerotic heart disease of native coronary artery without angina pectoris: Secondary | ICD-10-CM | POA: Diagnosis not present

## 2023-11-15 DIAGNOSIS — Z951 Presence of aortocoronary bypass graft: Secondary | ICD-10-CM | POA: Diagnosis not present

## 2023-11-15 DIAGNOSIS — I517 Cardiomegaly: Secondary | ICD-10-CM | POA: Diagnosis not present

## 2023-11-15 DIAGNOSIS — I351 Nonrheumatic aortic (valve) insufficiency: Secondary | ICD-10-CM | POA: Diagnosis not present

## 2023-11-18 DIAGNOSIS — Z4682 Encounter for fitting and adjustment of non-vascular catheter: Secondary | ICD-10-CM | POA: Diagnosis not present

## 2023-11-18 DIAGNOSIS — J9811 Atelectasis: Secondary | ICD-10-CM | POA: Diagnosis not present

## 2023-11-18 DIAGNOSIS — Z452 Encounter for adjustment and management of vascular access device: Secondary | ICD-10-CM | POA: Diagnosis not present

## 2023-11-18 DIAGNOSIS — Z951 Presence of aortocoronary bypass graft: Secondary | ICD-10-CM | POA: Diagnosis not present

## 2023-11-18 DIAGNOSIS — J811 Chronic pulmonary edema: Secondary | ICD-10-CM | POA: Diagnosis not present

## 2023-11-19 DIAGNOSIS — J811 Chronic pulmonary edema: Secondary | ICD-10-CM | POA: Diagnosis not present

## 2023-11-19 DIAGNOSIS — J9811 Atelectasis: Secondary | ICD-10-CM | POA: Diagnosis not present

## 2023-11-19 DIAGNOSIS — Z452 Encounter for adjustment and management of vascular access device: Secondary | ICD-10-CM | POA: Diagnosis not present

## 2023-11-19 DIAGNOSIS — Z951 Presence of aortocoronary bypass graft: Secondary | ICD-10-CM | POA: Diagnosis not present

## 2023-11-19 DIAGNOSIS — J90 Pleural effusion, not elsewhere classified: Secondary | ICD-10-CM | POA: Diagnosis not present

## 2023-11-19 DIAGNOSIS — Z4682 Encounter for fitting and adjustment of non-vascular catheter: Secondary | ICD-10-CM | POA: Diagnosis not present

## 2023-11-20 DIAGNOSIS — Z4682 Encounter for fitting and adjustment of non-vascular catheter: Secondary | ICD-10-CM | POA: Diagnosis not present

## 2023-11-20 DIAGNOSIS — Z951 Presence of aortocoronary bypass graft: Secondary | ICD-10-CM | POA: Diagnosis not present

## 2023-11-20 DIAGNOSIS — N1831 Chronic kidney disease, stage 3a: Secondary | ICD-10-CM | POA: Diagnosis not present

## 2023-11-20 DIAGNOSIS — I129 Hypertensive chronic kidney disease with stage 1 through stage 4 chronic kidney disease, or unspecified chronic kidney disease: Secondary | ICD-10-CM | POA: Diagnosis not present

## 2023-11-20 DIAGNOSIS — J90 Pleural effusion, not elsewhere classified: Secondary | ICD-10-CM | POA: Diagnosis not present

## 2023-11-20 DIAGNOSIS — I959 Hypotension, unspecified: Secondary | ICD-10-CM | POA: Diagnosis not present

## 2023-11-20 DIAGNOSIS — Z452 Encounter for adjustment and management of vascular access device: Secondary | ICD-10-CM | POA: Diagnosis not present

## 2023-11-20 DIAGNOSIS — N179 Acute kidney failure, unspecified: Secondary | ICD-10-CM | POA: Diagnosis not present

## 2023-11-20 DIAGNOSIS — J9811 Atelectasis: Secondary | ICD-10-CM | POA: Diagnosis not present

## 2023-11-21 DIAGNOSIS — Z452 Encounter for adjustment and management of vascular access device: Secondary | ICD-10-CM | POA: Diagnosis not present

## 2023-11-21 DIAGNOSIS — J9811 Atelectasis: Secondary | ICD-10-CM | POA: Diagnosis not present

## 2023-11-21 DIAGNOSIS — J90 Pleural effusion, not elsewhere classified: Secondary | ICD-10-CM | POA: Diagnosis not present

## 2023-11-21 DIAGNOSIS — Z951 Presence of aortocoronary bypass graft: Secondary | ICD-10-CM | POA: Diagnosis not present

## 2023-11-21 DIAGNOSIS — J811 Chronic pulmonary edema: Secondary | ICD-10-CM | POA: Diagnosis not present

## 2023-11-21 NOTE — Discharge Summary (Addendum)
 CTS Discharge Summary   Admit Date: 11/12/2023  Discharge Date: 11/24/2023  Admitting Physician: Lamar Elsie Minerva, MD   Discharge Physician: Trudy Juliene Ade, MD   Primary Care Physician: Dawna Domino, MD  Referring Physician: Self  Admission Diagnoses:  Chest pain, unspecified type [R07.9]  Discharge Diagnoses:  Principal Problem:   S/P CABG (coronary artery bypass graft) Active Problems:   Essential hypertension   Hyperlipidemia, mixed   Minimal sleep apnea with both obstructive and central features (Overall AHI 5/hr)   Stage 3a chronic kidney disease (CMS-HCC)   CAD S/P percutaneous coronary angioplasty   Class 2 severe obesity with serious comorbidity and body mass index (BMI) of 39.0 to 39.9 in adult   Chest pain on exertion   Unstable angina (CMS/HHS-HCC)   Prediabetes   Postoperative pain   Orthostatic hypotension   Acute pulmonary edema (CMS/HHS-HCC) Resolved Problems:   * No resolved hospital problems. *    Surgeries Performed:  CABGx3 (LIMA-LAD, SVG-OM1, SVG-PDA) on 11/18/23   Brief History of Present Illness: Per admission H&P dated 11/12/2023: Joshua Stark is a 78 y.o. male  with PMH significant for HTN, HLD, CKD, seziure disorder, CAD s/p stent in LAD 1999, prediabetes, and obesity who presents to the ED with chest pain. On interview patient reports he has experienced increased episodes of chest tightness for the past two weeks, described as a tightening in the chest and occasionally moving up the throat. Every so often the pain moves to the arms. These episodes occur during any physical activities, denies the pain at rest. The episodes last approximately 10 to 15 minutes and resolves with cessation of exertion. Prior to two weeks ago he only had the pain maybe 1x a week. He notes that today the pain has been sustained so he came to the ED. He has a history of a stent placement in his heart, though he did not experience symptoms at that time.  Notes he is SOB at baseline for the past couple of years (worse with exertion). Denies any chest abnormal heart beats or feelings of flutter. Endorses mild swelling in the legs.    In the ED patient given aspirin  chewable tablet 243. BP on admission 110/85 (max 171/62), HR 54-70, stating well on RA. Labs notable for K 5.1, Creatinine 1.8, hemoglobin 11.8,  Troponins trended from 37 -> 31.    Interview with son at bedside. Wishes to be full code.  Past Medical History:  Diagnosis Date  . Aortic ejection murmur 09/18/2013  . CAD S/P percutaneous coronary angioplasty 01/01/2016   Formatting of this note might be different from the original.  PCI- prox LAD @ D1, SP1 trifurcation.  3.0 mm x 16 mm AVE-GFX BMS     Last Assessment & Plan:   Formatting of this note might be different from the original.  Followed by Cardiology. Last note reviewed Dr. Keven.     >>OVERVIEW FOR CORONARY ARTERY DISEASE INVOLVING NATIVE CORONARY ARTERY OF NATIVE HEART WRITTEN ON 11/22/2019  4:58 PM B  . Cataract cortical, senile 2017  . Coronary artery disease   . CRI (chronic renal insufficiency), stage 3 (moderate) 01/11/2016  . Dyspnea on exertion 09/01/2018  . Essential hypertension 01/01/2016   Dx 1999    . History of seizure 01/01/2016   On Depakot    . Hyperlipidemia   . Hyperlipidemia, mixed 01/01/2016   Statin intolerant - tried Pravachol, Lipitor and Crestor  A. 2017 TC 157, TG 96, HDL 49, LDL 89 - Fenofibrate  +  Red Yeast Rice  B. 2017 Fenofibrate  stopped  C. 12/2015 NMR profile: TC 183, TG 95, HDL 58, LDL 106, LDL-P# 1071, Small LDL-P# 591, LDL size 21.6 - Red Yeast Rice - started Zetia   D. 02/2016 TC 180, TG 92, HDL 56, LDL 106 - Zetia  10mg   E. 2019 TC 190, TG 80, HDL 53, LDL 121 - Zetia  10mg  +  . Hypertension 12/1997  . Minimal sleep apnea with both obstructive and central features (Overall AHI 5/hr) 07/20/2019  . Motion sickness    sea sick x 1-remote  . Postoperative anemia due to acute blood loss  09/01/2018  . Seizures (CMS/HHS-HCC) 1968   had seizure sympoms  . Stage 3a chronic kidney disease (CMS-HCC) 12/11/2020  . Statin intolerance 01/01/2016   Statin intolerant - tried Pravachol, Lipitor and Crestor    . Unstable angina (CMS/HHS-HCC) 11/13/2023   Past Surgical History:  Procedure Laterality Date  . stint  12/1997  . CORONARY ANGIOPLASTY  12/1997  . CATARACT EXTRACTION  2017  . COLONOSCOPY W/BIOPSY N/A 12/11/2015   Procedure: COLONOSCOPY, FLEXIBLE; WITH BIOPSY, SINGLE OR MULTIPLE;  Surgeon: Asberry Jenkins Coffee, MD;  Location: [facility];  Service: Gastroenterology;  Laterality: N/A;  . COLONOSCOPY W/REMOVAL LESIONS BY SNARE N/A 12/11/2015   Procedure: COLONOSCOPY, FLEXIBLE; WITH REMOVAL OF TUMOR(S), POLYP(S), OR OTHER LESION(S) BY SNARE TECHNIQUE;  Surgeon: Asberry Jenkins Coffee, MD;  Location: [facility];  Service: Gastroenterology;  Laterality: N/A;  . joint replacement right knee Right 2024  . COLONOSCOPY W/REMOVAL LESIONS BY SNARE N/A 04/19/2023   Procedure: COLONOSCOPY, FLEXIBLE; WITH REMOVAL OF TUMOR(S), POLYP(S), OR OTHER LESION(S) BY SNARE TECHNIQUE;  Surgeon: Russella Missy Bers, MD;  Location: [facility];  Service: Gastroenterology;  Laterality: N/A;  . CORONARY ARTERY BYPASS W/SINGLE ARTERY GRAFT N/A 11/18/2023   Procedure: CORONARY ARTERY BYPASS, USING ARTERIAL GRAFT(S); SINGLE ARTERIAL GRAFT, internal mammary artery harvest;  Surgeon: Trudy Juliene Ade, MD;  Location: [facility];  Service: Cardiothoracic;  Laterality: N/A;  . CORONARY ARTERY BYPASS W/VEIN ONLY N/A 11/18/2023   Procedure: CORONARY ARTERY BYPASS, USING VENOUS GRAFT(S) AND ARTERIAL GRAFT(S);SINGLE VEIN GRAFT (LIST IN ADDITION TO PRIMARY PROCEDURE);  Surgeon: Trudy Juliene Ade, MD;  Location: [facility];  Service: Cardiothoracic;  Laterality: N/A;  . ENDOSCOPIC HARVEST OF VEINS FOR CORONARY ARTERY BYPASS PROCEDURE N/A 11/18/2023    Procedure: ENDOSCOPY, SURGICAL, INCLUDING VIDEO-ASSISTED HARVEST OF VEIN(S) FOR CORONARY ARTERY BYPASS PROCEDURE  (LIST IN ADDITION TO PRIMARY PROCEDURE);  Surgeon: Trudy Juliene Ade, MD;  Location: [facility];  Service: Cardiothoracic;  Laterality: N/A;  . APPLICATION WOUND VAC N/A 11/18/2023   Procedure: NEGATIVE PRESSURE WOUND THERAPY, INCLUDING TOPICAL APPLICATION(S), ASSESSMENT, INSTRUCTION(S) FOR ONGOING CARE, PER SESSION; TOTAL WOUND(S) SURFACE AREA LESS THAN OR EQUAL TO 50 SQUARE CENTIMETERS;  Surgeon: Trudy Juliene Ade, MD;  Location: [facility];  Service: Cardiothoracic;  Laterality: N/A;  . JOINT REPLACEMENT Left    knee  . ORIF DISTAL FEMUR FRACTURE Left    Family History  Problem Relation Name Age of Onset  . Hip fracture Mother thelma Pullen   . High blood pressure (Hypertension) Mother thelma Belizaire   . Alcohol abuse Father j d Eckstrom   . Prostate cancer Father j d Stonebraker   . Anesthesia problems Neg Hx     Social History   Socioeconomic History  . Marital status: Married    Spouse name: Yamin Swingler  . Number of children: 2  Occupational History  . 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 Smoking status: Never  . Smokeless tobacco: Never  Vaping Use  . Vaping status: Never Used  Substance and Sexual Activity  . Alcohol use: No    Alcohol/week: 0.0 standard drinks of alcohol  . Drug use: No  . Sexual activity: Yes    Partners: Female    Birth control/protection: None  Other Topics Concern  . Would you please tell us  about the people who live in your home, your pets, or anything else important to your social life? Yes  Social History Narrative   Born in [place], [place].  Married with 2 children.  Lives near [place], [place].  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 Son is now running the business.    Social Drivers of Corporate Investment Banker Strain: Low Risk  (11/24/2023)   Overall Physicist, Medical Strain (CARDIA)   . Difficulty of Paying Living Expenses: Not very hard  Food Insecurity: No Food Insecurity (11/24/2023)   Hunger Vital Sign   . Worried About Programme Researcher, Broadcasting/film/video in the Last Year: Never true   . Ran Out of Food in the Last Year: Never true  Transportation Needs: No Transportation Needs (11/24/2023)   PRAPARE - Transportation   . Lack of Transportation (Medical): No   . Lack of Transportation (Non-Medical): No   Allergies  Allergen Reactions  . Fenofibrate  Other (See Comments)    arthritis  . Lipitor [Atorvastatin] Other (See Comments)    Joint ache  . Pravastatin Sodium Muscle Pain    REACTION: cramps  . Rosuvastatin Muscle Pain    REACTION: cramps  . Statins-Hmg-Coa Reductase Inhibitors Muscle Pain and Other (See Comments)    Cramping   Prior to Admission Medications  Prescriptions Last Dose Taking?  REPATHA SURECLICK 140 mg/mL PnIj 11/06/2023 No  Sig: INJECT 140MG  SUB-Q EVERY 14 DAYS AS DIRECTED.  ZEPBOUND 2.5 mg/0.5 mL injection 11/06/2023 No  Sig: Inject 0.5 mLs (2.5 mg total) subcutaneously every 7 (seven) days  acetaminophen  (TYLENOL ) 650 MG ER tablet Past Month Yes  Sig: Take 2 tablets (1,300 mg total) by mouth 2 (two) times daily Once a week or longer interval  amLODIPine  (NORVASC ) 5 MG tablet 11/12/2023 Yes  Sig: TAKE ONE TABLET BY MOUTH TWICE DAILY  amoxicillin (AMOXIL) 500 MG capsule  No  ascorbic acid (VITAMIN C ORAL) Past Month Yes  Sig: Take 1 tablet by mouth A few times a week  ascorbic acid, vitamin C, 500 mg Cap 11/12/2023 Yes  Sig: Take 1,000 mg by mouth every Monday and Friday  aspirin  81 MG EC tablet 11/12/2023 Yes  Sig: Take 81 mg by mouth once daily.  divalproex  (DEPAKOTE ) 250 MG DR tablet 11/12/2023 Yes  Sig: Take 250 mg by mouth 2 (two) times daily.    garlic 1,000 mg Cap 11/12/2023 Yes  Sig: Take 2,000 mg by mouth at  bedtime  hydrALAZINE (APRESOLINE) 25 MG tablet 11/12/2023 Yes  Sig: Take 1 tablet (25 mg total) by mouth 2 (two) times daily  hydroCHLOROthiazide  (HYDRODIURIL ) 25 MG tablet 11/12/2023 Yes  Sig: TAKE 1 TABLET BY MOUTH DAILY  nitroGLYcerin  (NITROSTAT ) 0.4 MG SL tablet Not Taking No  Sig: PLACE 1 TABLET UNDER THE TONGUE AS NEEDED DISSOLVE 1 TABLET UNDER THE TONGUE FOR CHEST PAIN. MAY REPEAT EVERY UP TO 3 DOSES. IF NO RELIEF, CALL 911**  Patient not  taking: Reported on 11/13/2023  valsartan (DIOVAN) 320 MG tablet 11/12/2023 Yes  Sig: TAKE 1 TABLET BY MOUTH ONCE DAILY.    Facility-Administered Medications: None   Hospital Course: Ada Woodbury is a 78 y.o. male who admitted to [facility] with chest pain. CTS was consulted to consider intervention for his cardiothoracic diagnosis of CAD.  The patient was taken to the operating room by Trudy Juliene Ade, *, who performed the above procedure(s).  Overall, the patient tolerated the procedure well without significant difficulty or evident complication, and was transferred to the cardiothoracic surgical intensive care unit.   In the immediate postoperative period, he continued to do progress well.  Ultimately, he was transferred to the step-down unit for the remainder of his hospital course.     The hospital course was significant for the following:.  Orthostatic Hypotension: Supported with pressors and inotrope initially. Midodrine added to wean off IV agents as well as IVF resuscitation. BP improved and midodrine was weaned off. Plan to start beta blocker therapy in the outpatient setting.  Rhythm at the time of discharge was normal sinus rhythm.   After discussion with the case manager, patient and family; the decision was made to have patient discharge to home. Once follow-up arrangements were confirmed, the patient was felt suitable for discharge to home on 11/24/2023.  Discharge Disposition: Home  Discharge Exam  (taken from progress note on 11/23/2023):  BP (!) 148/54 (BP Location: Left upper arm, Patient Position: Lying, BP Cuff Size: Adult)   Pulse 74   Temp 36.6 C (97.8 F) (Oral)   Resp 18   Ht 180.3 cm (5' 10.98)   Wt (!) 128.5 kg (283 lb 4.7 oz)   SpO2 94%   BMI 39.53 kg/m  Neurologic: alert and oriented x 4, moves all extremities and follows commands Cardiac: regular rate and rhythm, S1 and S2 present and no murmur, rub or gallop present Respiratory: clear to auscultation bilaterally and breath sounds diminished bilaterally Abdomen: normal bowel sounds, soft, nontender and nondistended Extremities: pulses 2+ bilaterally, upper and lower extremities and no cyanosis, clubbing or peripheral edema Incisions: Midsternal incision clean, dry and intact. Bilateral groins clean and dry.      Discharge Medications     PAUSE taking these medications      Details  ZEPBOUND 2.5 mg/0.5 mL injection Wait to take this until your doctor or other care provider tells you to start again. Discuss resumption at follow up appointment Generic drug: tirzepatide  2.5 mg, Subcutaneous, Every 7 days Quantity: 2 mL Refills: 1       New Medications      Details  acetaminophen  325 MG tablet Commonly known as: TYLENOL  Replaces: acetaminophen  650 MG ER tablet  975 mg, Oral, Every 6 hours PRN Refills: 0   FUROsemide  40 MG tablet Commonly known as: LASIX   40 mg, Oral, 2 times Daily Quantity: 60 tablet Refills: 0   oxyCODONE  5 MG immediate release tablet Commonly known as: ROXICODONE   5-10 mg, Oral, Every 4 hours PRN Quantity: 50 tablet Refills: 0   polyethylene glycol packet Commonly known as: MIRALAX   17 g, Oral, 2 times Daily PRN, Mix in 4-8ounces of fluid prior to taking. Refills: 0   potassium chloride 20 MEQ ER tablet Commonly known as: KLOR-CON M20  20 mEq, Oral, 2 times Daily Quantity: 60 tablet Refills: 2   sennosides-docusate 8.6-50 mg tablet Commonly known as: SENOKOT-S   2 tablets, Oral, 2 times Daily PRN Refills: 0  Medications To Continue      Details  ascorbic acid (vitamin C) 500 mg Cap  1,000 mg, Every Monday and Friday Refills: 0   aspirin  81 MG EC tablet  81 mg, Daily Refills: 0   divalproex  250 MG DR tablet Commonly known as: DEPAKOTE   250 mg, 2 times Daily Refills: 0   REPATHA SURECLICK 140 mg/mL Pnij Generic drug: evolocumab  INJECT 140MG  SUB-Q EVERY 14 DAYS AS DIRECTED. Quantity: 6 mL Refills: 2   VITAMIN C ORAL  1 tablet Refills: 0       Stopped Medications    acetaminophen  650 MG ER tablet Commonly known as: TYLENOL  Replaced by: acetaminophen  325 MG tablet   amLODIPine  5 MG tablet Commonly known as: NORVASC    amoxicillin 500 MG capsule Commonly known as: AMOXIL   garlic 1,000 mg Cap   hydrALAZINE 25 MG tablet Commonly known as: APRESOLINE   hydroCHLOROthiazide  25 MG tablet Commonly known as: HYDRODIURIL    nitroGLYcerin  0.4 MG SL tablet Commonly known as: NITROSTAT    valsartan 320 MG tablet Commonly known as: DIOVAN         Antiplatelets:  prescribed  Statins/Antilipid Agent(s):  contraindicated due to allergy  Beta Blocker: contraindicated due to Hypotension. Plan to start in he outpatient setting.  ACE Inhibitor/ARB:  not indicated  Anticoagulation: no indicated  DISCHARGE INSTRUCTIONS:  Wound care: Monitor wounds daily. Keep clean, dry, and intact.  Activity:  - Ambulate 2-3 times daily - May drive in 14 days if off of narcotic pain medication.  - Sternal precautions: May lift objects if arms are kept close enough to touch body and avoid pushing/pulling objects with arms x 6 weeks. Avoid stretching both arms backwards for 10 days from surgery. Avoid lifting with one arm for 6 weeks from surgery.  May return to work: When cleared by your surgeon   Cardiac Rehab: yes - outpatient ambulatory rehab referral placed   Discharge Diet: Active Orders  Diet   Diet regular     DISCHARGE LABORATORIES (last 3 days): Recent Labs  Lab 11/21/23 0658 11/23/23 0510 11/23/23 0915 11/24/23 0349  NA 135 131* 137 136  K 4.1  --  4.3 4.4  CL 102 97* 104 101  CO2 24 19* 24 25  BUN 48* 46* 54* 54*  CREATININE 2.2* 1.4* 1.8* 1.9*  GLUCOSE 113 231* 123 132  CALCIUM  8.5* 7.9* 8.7 8.7   Recent Labs  Lab 11/23/23 0510 11/23/23 0916 11/24/23 0349  WBC 5.1 6.2 5.6  HGB 7.3* 9.5* 9.2*  HCT 23.9* 29.7* 27.8*  PLT 116* 192 191   Recent Labs  Lab 11/18/23 1410  INR 1.2*   Future Appointments  Date Time Provider Department Center  12/13/2023 10:30 AM [id number]  12/13/2023 11:00 AM [facility] XR 5 [facility]  12/13/2023 11:30 AM Trudy Juliene Ade, MD 2F2G [facility]  08/27/2024  9:45 AM Sisto Jerry Elizabeth, Samule, MD [facility]   Report: chest/anginal pain like before surgery, swelling in feet, legs, or stomach, nausea or vomiting, fever greater than 101.0 F (38.3 C) degrees, bleeding, redness, swelling, pain, or drainage from surgical incision(s), difficulty breathing or shortness of breath, dizziness, or weight gain greater than 2 lbs in 1 day or 5 lbs in 1 week  Report Issues: Mon - Fri normal working hours: Call Dr. Trudy' office at [phone] After hours and weekends: [phone] and ask for the Thoracic Surgery Resident on-call  Time Spent with Patient:  45 minutes    TIFFANY CHANG, NP 11/24/2023  Addendum Acute pulmonary edema was not significant. Juliene Pouch, MD

## 2023-11-25 ENCOUNTER — Telehealth: Payer: Self-pay

## 2023-11-25 NOTE — Transitions of Care (Post Inpatient/ED Visit) (Signed)
   11/25/2023  Name: Joshua Stark MRN: [id number] DOB: [birth year]  Today's TOC FU Call Status: Today's TOC FU Call Status:: Unsuccessful Call (1st Attempt) Unsuccessful Call (1st Attempt) Date: 11/25/23  Attempted to reach the patient regarding the most recent Inpatient/ED visit.  Follow Up Plan: Additional outreach attempts will be made to reach the patient to complete the Transitions of Care (Post Inpatient/ED visit) call.   Arvin Seip RN, BSN, CCM [employer], Population Health Case Manager Phone: [phone]

## 2023-11-28 NOTE — Discharge Summary (Addendum)
 CTS Discharge Summary   Admit Date: 11/25/2023  Discharge Date: 11/29/2023  Admitting Physician: Juliene Charlie Pouch, MD   Discharge Physician: Pouch Juliene Charlie, MD   Primary Care Physician: Dawna Domino, MD  Referring Physician: Self  Admission Diagnoses:  Atrial fibrillation, unspecified type (CMS/HHS-HCC) [I48.91]  Discharge Diagnoses:  Principal Problem:   Postoperative atrial fibrillation Active Problems:   Essential hypertension   Hyperlipidemia, mixed   Statin intolerance   CAD S/P percutaneous coronary angioplasty   S/P CABG (coronary artery bypass graft)   Postoperative pain Resolved Problems:   * No resolved hospital problems. *   Surgeries Performed: none  Brief History of Present Illness: Per admission H&P dated 11/25/2023: Joshua Stark is a 78 y.o. male with h/o HTN, HLD, CKD3a (baseline 1.5-1.6), prior LAD stent 1999 (asymptomatic CAD), prediabetes, obesity (BMI 39), prior history of seizures (>10y ago), and multivessel CAD who recently underwent CABGx3 (LIMA-LAD, SVG-OM1, SVG-PDA) with Dr. Pouch on 11/18/23 who presents to the [facility] ED with a one-day history of dyspnea. Patient reports that he was discharged yesterday and early this morning he started to feel short of breath, fatigued, and generally unwell. He called the triage line and was instructed to check his heart rate on his watch, which was noted to be fluctuating between 70s-150s. Patient was told to report to the ED and subsequently found to be in Afib with RVR. He has been adherent to all medications since discharge. Denies associated palpitations, chest pain, orthopnea, fevers/chills; endorses lightheadedness and bilateral lower extremity edema but states that this has been improving since surgery.    On my exam, he is in Afib with RVR, HR 120s, on 2L Lena and otherwise HDS. Labs notable for lactate 1.2, Cr 1.8, WBC 7.7, H/H 10.2/32.1, INR 1.1, TnT 105, pro-BNP 9299. CXR shows small  bilateral pleural effusions. Receiving amiodarone bolus.  Past Medical History:  Diagnosis Date  . Aortic ejection murmur 09/18/2013  . CAD S/P percutaneous coronary angioplasty 01/01/2016   Formatting of this note might be different from the original.  PCI- prox LAD @ D1, SP1 trifurcation.  3.0 mm x 16 mm AVE-GFX BMS     Last Assessment & Plan:   Formatting of this note might be different from the original.  Followed by Cardiology. Last note reviewed Dr. Keven.     >>OVERVIEW FOR CORONARY ARTERY DISEASE INVOLVING NATIVE CORONARY ARTERY OF NATIVE HEART WRITTEN ON 11/22/2019  4:58 PM B  . Cataract cortical, senile 2017  . Coronary artery disease   . CRI (chronic renal insufficiency), stage 3 (moderate) 01/11/2016  . Dyspnea on exertion 09/01/2018  . Essential hypertension 01/01/2016   Dx 1999    . History of seizure 01/01/2016   On Depakot    . Hyperlipidemia   . Hyperlipidemia, mixed 01/01/2016   Statin intolerant - tried Pravachol, Lipitor and Crestor  A. 2017 TC 157, TG 96, HDL 49, LDL 89 - Fenofibrate  + Red Yeast Rice  B. 2017 Fenofibrate  stopped  C. 12/2015 NMR profile: TC 183, TG 95, HDL 58, LDL 106, LDL-P# 1071, Small LDL-P# 591, LDL size 21.6 - Red Yeast Rice - started Zetia   D. 02/2016 TC 180, TG 92, HDL 56, LDL 106 - Zetia  10mg   E. 2019 TC 190, TG 80, HDL 53, LDL 121 - Zetia  10mg  +  . Hypertension 12/1997  . Minimal sleep apnea with both obstructive and central features (Overall AHI 5/hr) 07/20/2019  . Motion sickness    sea sick x 1-remote  .  Postoperative anemia due to acute blood loss 09/01/2018  . Seizures (CMS/HHS-HCC) 1968   had seizure sympoms  . Stage 3a chronic kidney disease (CMS-HCC) 12/11/2020  . Statin intolerance 01/01/2016   Statin intolerant - tried Pravachol, Lipitor and Crestor    . Unstable angina (CMS/HHS-HCC) 11/13/2023   Past Surgical History:  Procedure Laterality Date  . stint  12/1997  . CORONARY ANGIOPLASTY  12/1997  . CATARACT EXTRACTION  2017  .  COLONOSCOPY W/BIOPSY N/A 12/11/2015   Procedure: COLONOSCOPY, FLEXIBLE; WITH BIOPSY, SINGLE OR MULTIPLE;  Surgeon: Asberry Jenkins Coffee, MD;  Location: [facility];  Service: Gastroenterology;  Laterality: N/A;  . COLONOSCOPY W/REMOVAL LESIONS BY SNARE N/A 12/11/2015   Procedure: COLONOSCOPY, FLEXIBLE; WITH REMOVAL OF TUMOR(S), POLYP(S), OR OTHER LESION(S) BY SNARE TECHNIQUE;  Surgeon: Asberry Jenkins Coffee, MD;  Location: [facility];  Service: Gastroenterology;  Laterality: N/A;  . joint replacement right knee Right 2024  . COLONOSCOPY W/REMOVAL LESIONS BY SNARE N/A 04/19/2023   Procedure: COLONOSCOPY, FLEXIBLE; WITH REMOVAL OF TUMOR(S), POLYP(S), OR OTHER LESION(S) BY SNARE TECHNIQUE;  Surgeon: Russella Missy Bers, MD;  Location: [facility];  Service: Gastroenterology;  Laterality: N/A;  . CORONARY ARTERY BYPASS W/SINGLE ARTERY GRAFT N/A 11/18/2023   Procedure: CORONARY ARTERY BYPASS, USING ARTERIAL GRAFT(S); SINGLE ARTERIAL GRAFT, internal mammary artery harvest;  Surgeon: Trudy Juliene Ade, MD;  Location: [facility];  Service: Cardiothoracic;  Laterality: N/A;  . CORONARY ARTERY BYPASS W/VEIN ONLY N/A 11/18/2023   Procedure: CORONARY ARTERY BYPASS, USING VENOUS GRAFT(S) AND ARTERIAL GRAFT(S);SINGLE VEIN GRAFT (LIST IN ADDITION TO PRIMARY PROCEDURE);  Surgeon: Trudy Juliene Ade, MD;  Location: [facility];  Service: Cardiothoracic;  Laterality: N/A;  . ENDOSCOPIC HARVEST OF VEINS FOR CORONARY ARTERY BYPASS PROCEDURE N/A 11/18/2023   Procedure: ENDOSCOPY, SURGICAL, INCLUDING VIDEO-ASSISTED HARVEST OF VEIN(S) FOR CORONARY ARTERY BYPASS PROCEDURE  (LIST IN ADDITION TO PRIMARY PROCEDURE);  Surgeon: Trudy Juliene Ade, MD;  Location: [facility];  Service: Cardiothoracic;  Laterality: N/A;  . APPLICATION WOUND VAC N/A 11/18/2023   Procedure: NEGATIVE PRESSURE WOUND THERAPY, INCLUDING TOPICAL APPLICATION(S), ASSESSMENT,  INSTRUCTION(S) FOR ONGOING CARE, PER SESSION; TOTAL WOUND(S) SURFACE AREA LESS THAN OR EQUAL TO 50 SQUARE CENTIMETERS;  Surgeon: Trudy Juliene Ade, MD;  Location: [facility];  Service: Cardiothoracic;  Laterality: N/A;  . JOINT REPLACEMENT Left    knee  . ORIF DISTAL FEMUR FRACTURE Left    Family History  Problem Relation Name Age of Onset  . Hip fracture Mother thelma Napoles   . High blood pressure (Hypertension) Mother thelma Brickman   . Alcohol abuse Father j d Alcock   . Prostate cancer Father j d Schubert   . Anesthesia problems Neg Hx     Social History   Socioeconomic History  . Marital status: Married    Spouse name: Carlester Kasparek  . Number of children: 2  Occupational History  . 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 Smoking status: Never  . Smokeless tobacco: Never  Vaping Use  . Vaping status: Never Used  Substance and Sexual Activity  . Alcohol use: No    Alcohol/week: 0.0 standard drinks of alcohol  . Drug use: No  . Sexual activity: Yes    Partners: Female    Birth control/protection: None  Other Topics Concern  . Would you please tell us  about the people who live in your home, your pets, or anything else important  to your social life? Yes  Social History Narrative   Born in [place], [place].  Married with 2 children.  Lives near [place], [place].  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 Son is now running the business.   Social Drivers of Corporate Investment Banker Strain: Low Risk  (11/26/2023)   Overall Financial Resource Strain (CARDIA)   . Difficulty of Paying Living Expenses: Not very hard  Food Insecurity: No Food Insecurity (11/26/2023)   Hunger Vital Sign   . Worried About Programme Researcher, Broadcasting/film/video in the Last Year: Never true   . Ran Out of Food in the Last Year: Never true  Transportation Needs: No  Transportation Needs (11/28/2023)   PRAPARE - Transportation   . Lack of Transportation (Medical): No   . Lack of Transportation (Non-Medical): No   Allergies  Allergen Reactions  . Fenofibrate  Other (See Comments)    arthritis  . Lipitor [Atorvastatin] Other (See Comments)    Joint ache  . Pravastatin Sodium Muscle Pain    REACTION: cramps  . Rosuvastatin Muscle Pain    REACTION: cramps  . Statins-Hmg-Coa Reductase Inhibitors Muscle Pain and Other (See Comments)    Cramping   Prior to Admission Medications  Prescriptions Last Dose Taking?  FUROsemide  (LASIX ) 40 MG tablet 11/26/2023 Yes  Sig: Take 1 tablet (40 mg total) by mouth 2 (two) times daily  REPATHA SURECLICK 140 mg/mL PnIj 11/09/2023 No  Sig: INJECT 140MG  SUB-Q EVERY 14 DAYS AS DIRECTED.  ZEPBOUND 2.5 mg/0.5 mL injection Past Month Yes  Sig: Inject 0.5 mLs (2.5 mg total) subcutaneously every 7 (seven) days  acetaminophen  (TYLENOL ) 325 MG tablet 11/26/2023 Yes  Sig: Take 3 tablets (975 mg total) by mouth every 6 (six) hours as needed for Pain for up to 30 days  ascorbic acid (VITAMIN C ORAL) Past Month Yes  Sig: Take 1 tablet by mouth A few times a week  ascorbic acid, vitamin C, 500 mg Cap 11/25/2023 Yes  Sig: Take 1,000 mg by mouth every Monday and Friday  aspirin  81 MG EC tablet 11/26/2023 Yes  Sig: Take 81 mg by mouth once daily.  divalproex  (DEPAKOTE ) 250 MG DR tablet 11/26/2023 Yes  Sig: Take 250 mg by mouth 2 (two) times daily.    oxyCODONE  (ROXICODONE ) 5 MG immediate release tablet Past Month Yes  Sig: Take 1-2 tablets (5-10 mg total) by mouth every 4 (four) hours as needed (5mg  for pain 4-6, 10 mg for pain 7-10) for up to 7 days  polyethylene glycol (MIRALAX ) packet Past Month Yes  Sig: Take 1 packet (17 g total) by mouth 2 (two) times daily as needed for Constipation Mix in 4-8ounces of fluid prior to taking.  sennosides-docusate (SENOKOT-S) 8.6-50 mg tablet Past Month Yes  Sig: Take 2 tablets by mouth 2 (two)  times daily as needed for Constipation    Facility-Administered Medications: None   Hospital Course: Joell Usman is a 78 y.o. male who admitted to [facility] with atrial fibrillation.  He was treated with amiodarone load, beta blocker.  Additionally, he was started on IV diuresis.  He continued to do progress well.  Ultimately, he underwent DCCV on 11/2 and returned to [facility].    The hospital course was significant for the following:. Afib w/ RVR: Underwent successful DCCV to [facility] on 11/2. BB and Amio were initiated. Anticoagulated with Eliquis.  AKI: SCr peaked at 2.3 during  admission (likely due to NPO status, afib and diuretics). Discharged on 20 PO lasix  daily.   Rhythm at the time of discharge was normal sinus rhythm in the 60s.   After discussion with the case manager, patient and family; the decision was made to have patient discharge to home. Once follow-up arrangements were confirmed, the patient was felt suitable for discharge to home on 11/29/2023.  Discharge Disposition: Home  Discharge Exam (taken from progress note on 11/3):  BP 139/63 (BP Location: Left upper arm, Patient Position: Sitting, BP Cuff Size: Adult)   Pulse 70   Temp 36.9 C (98.4 F) (Oral)   Resp 21   Ht 180.3 cm (5' 10.98)   Wt (!) 126.4 kg (278 lb 10.6 oz)   SpO2 94%   BMI 38.88 kg/m  Neurologic: alert and oriented x 4, moves all extremities, and follows commands Cardiac: regular rate and rhythm on telemetry  Respiratory: normal WOB, on RA  Abdomen: soft, nontender, and nondistended Extremities: pulses 2+ bilaterally, upper and lower extremities and no cyanosis, no clubbing, +2 BLE edema present  Incisions: MSI clean, dry and intact  Integumentary: warm, dry, normal turgor    Discharge Medications     New Medications      Details  AMIOdarone 200 MG tablet Commonly known as: PACERONE Start taking on: November 30, 2023  200 mg, Oral, Daily Quantity: 30 tablet Refills: 0    apixaban 5 mg tablet Commonly known as: ELIQUIS  5 mg, Oral, Every 12 hours Quantity: 60 tablet Refills: 2   metoprolol TARTrate 25 MG tablet Commonly known as: LOPRESSOR  25 mg, Oral, Every 12 hours Quantity: 60 tablet Refills: 5   ZEPBOUND 2.5 mg/0.5 mL injection Generic drug: tirzepatide  2.5 mg, Subcutaneous, Every 7 days Quantity: 2 mL Refills: 1       Modified Medications      Details  FUROsemide  20 MG tablet Commonly known as: LASIX  What changed:  medication strength how much to take when to take this  20 mg, Oral, Daily Quantity: 30 tablet Refills: 0   potassium chloride 20 MEQ ER tablet Commonly known as: KLOR-CON M20 What changed: when to take this  20 mEq, Oral, Daily Refills: 0       Medications To Continue      Details  acetaminophen  325 MG tablet Commonly known as: TYLENOL   975 mg, Oral, Every 6 hours PRN Refills: 0   ascorbic acid (vitamin C) 500 mg Cap  1,000 mg, Every Monday and Friday Refills: 0   aspirin  81 MG EC tablet  81 mg, Daily Refills: 0   divalproex  250 MG DR tablet Commonly known as: DEPAKOTE   250 mg, 2 times Daily Refills: 0   oxyCODONE  5 MG immediate release tablet Commonly known as: ROXICODONE   5-10 mg, Oral, Every 4 hours PRN Quantity: 50 tablet Refills: 0   polyethylene glycol packet Commonly known as: MIRALAX   17 g, Oral, 2 times Daily PRN, Mix in 4-8ounces of fluid prior to taking. Refills: 0   REPATHA SURECLICK 140 mg/mL Pnij Generic drug: evolocumab  INJECT 140MG  SUB-Q EVERY 14 DAYS AS DIRECTED. Quantity: 6 mL Refills: 2   sennosides-docusate 8.6-50 mg tablet Commonly known as: SENOKOT-S  2 tablets, Oral, 2 times Daily PRN Refills: 0   VITAMIN C ORAL  1 tablet Refills: 0        Antiplatelets:  prescribed  Statins/Antilipid Agent(s):  prescribed  Beta Blocker: prescribed  ACE Inhibitor/ARB:  not indicated  Anticoagulation:  Patient  discharged home on apixaban for atrial  fibrillation.    DISCHARGE INSTRUCTIONS:   Antibiotic Prophylaxis: Patient will not require antibiotics prior to any dental procedures.  Wound care: Monitor wounds daily. Keep clean, dry, and intact.  Activity:  - Ambulate 2-3 times daily - May drive in 14 days if off of narcotic pain medication.  - Sternal precautions: May lift objects if arms are kept close enough to touch body and avoid pushing/pulling objects with arms x 6 weeks. Avoid stretching both arms backwards for 10 days from surgery. Avoid lifting with one arm for 6 weeks from surgery.  May return to work: When cleared by your surgeon   Cardiac Rehab: yes - outpatient ambulatory rehab referral placed previous admission   Discharge Diet: Active Orders  Diet   Diet regular    DISCHARGE LABORATORIES (last 3 days): Recent Labs  Lab 11/28/23 0345 11/28/23 1331 11/29/23 0322  NA 136 137 136  K 4.4 4.4 4.1  CL 98 96* 99  CO2 29 28 27   BUN 43* 44* 38*  CREATININE 2.3* 2.0* 2.1*  GLUCOSE 101 114 140  CALCIUM  8.9 9.0 8.8   Recent Labs  Lab 11/27/23 0631 11/28/23 0345 11/29/23 0322  WBC 7.6 8.4 8.3  HGB 9.2* 8.9* 9.0*  HCT 29.1* 28.2* 28.9*  PLT 313 366 378   Recent Labs  Lab 11/25/23 1858  INR 1.1   Future Appointments  Date Time Provider Department Center  12/13/2023 10:30 AM [id number]  12/13/2023 11:00 AM [facility] XR 5 [facility]  12/13/2023 11:30 AM Trudy Juliene Ade, MD 2F2G [facility]  08/27/2024  9:45 AM Sisto Jerry Elizabeth, Samule, MD [facility]   Report: chest/anginal pain like before surgery, swelling in feet, legs, or stomach, nausea or vomiting, fever greater than 101.0 F (38.3 C) degrees, bleeding, redness, swelling, pain, or drainage from surgical incision(s), bleeding or swelling at groin catheter access site, difficulty breathing or shortness of breath, dizziness, or weight gain greater than 2 lbs in 1 day or 5 lbs in 1 week  Report  Issues: Surgeon's office during office hours and after hours call [phone] and ask for the Thoracic Surgery Resident on-call  Time Spent with Patient: 45 minutes    [facility] REGENIA MOLT, NP 11/29/2023  Addendum Atrial fibrillation is postoperative due to surgery. Juliene Trudy, MD

## 2023-11-30 ENCOUNTER — Telehealth: Payer: Self-pay

## 2023-11-30 NOTE — Transitions of Care (Post Inpatient/ED Visit) (Signed)
   11/30/2023  Name: Joshua Stark MRN: [id number] DOB: 06-24-1945  Today's TOC FU Call Status: Today's TOC FU Call Status:: Unsuccessful Call (1st Attempt) Unsuccessful Call (1st Attempt) Date: 11/30/23  Attempted to reach the patient regarding the most recent Inpatient/ED visit.  Follow Up Plan: Additional outreach attempts will be made to reach the patient to complete the Transitions of Care (Post Inpatient/ED visit) call.   Arvin Seip RN, BSN, CCM [employer], Population Health Case Manager Phone: [phone]

## 2023-12-07 ENCOUNTER — Other Ambulatory Visit: Payer: Self-pay | Admitting: Family Medicine

## 2023-12-07 NOTE — Telephone Encounter (Signed)
 Please call and schedule Medicare Wellness with Erminio and CPE with fasting labs prior with Dr. Avelina.   Last office visit 10/28/2022 for CPE.  Last refilled 11/30/22 for #180 with 3 refills.  No future appointments.

## 2023-12-20 NOTE — Telephone Encounter (Signed)
 Lvm and sent MyChart Message

## 2023-12-20 NOTE — Telephone Encounter (Signed)
 Lvm/ sent MyChart

## 2024-01-25 ENCOUNTER — Other Ambulatory Visit: Payer: Self-pay

## 2024-01-25 ENCOUNTER — Encounter

## 2024-01-25 DIAGNOSIS — Z951 Presence of aortocoronary bypass graft: Secondary | ICD-10-CM

## 2024-01-25 NOTE — Progress Notes (Signed)
 Virtual Visit completed. Patient informed on EP and RD appointment and 6 Minute walk test. Patient also informed of patient health questionnaires on My Chart. Patient Verbalizes understanding. Visit diagnosis can be found in [facility] 12/13/2023.

## 2024-02-01 ENCOUNTER — Encounter: Attending: Cardiology

## 2024-02-01 VITALS — Ht 70.3 in | Wt 257.9 lb

## 2024-02-01 DIAGNOSIS — Z951 Presence of aortocoronary bypass graft: Secondary | ICD-10-CM | POA: Insufficient documentation

## 2024-02-01 DIAGNOSIS — Z48812 Encounter for surgical aftercare following surgery on the circulatory system: Secondary | ICD-10-CM | POA: Insufficient documentation

## 2024-02-01 NOTE — Progress Notes (Signed)
 Assessment start time: 10:17 AM  Digestive issues/concerns: no known food allergies   24-hours Recall: B: oatmeal, egg, toast, grits OR cheese omelet L: chicken, veggies OR salads sandwich D: chicken, veggies, sometimes a potato  Beverages water , 1-2 sodas per week  Education r/t nutrition plan Joshua Stark is drinking mostly water . He eats 3 meals per day, has been cutting back on processed meats since his surgery. Says he used to eat bacon most mornings but not anymore. Reviewed Mediterranean diet handout. Educated on types of fats, sources, and how to read labels. Went over protein carb pairing and brainstormed several meals and snacks with foods he likes and will eat focusing lowering sodium and meeting nutritional goals like fiber, protein, and calories.    Goal 1: Read labels and reduce sodium intake to below 2300mg . Ideally 1500mg  per day.  Goal 2: Reduce saturated fat, less than 12g per day. Replace bad fats for more heart healthy fats. Goal 3: Eat 15-30gProtein and 30-60gCarbs at each meal.  End time 11:06 AM

## 2024-02-01 NOTE — Patient Instructions (Signed)
 Patient Instructions  Patient Details  Name: Joshua Stark MRN: [id number] Date of Birth: March 15, 1945 Referring Provider:  Bernardino Ned, MD  Below are your personal goals for exercise, nutrition, and risk factors. Our goal is to help you stay on track towards obtaining and maintaining these goals. We will be discussing your progress on these goals with you throughout the program.  Initial Exercise Prescription:  Initial Exercise Prescription - 02/01/24 1100       Date of Initial Exercise RX and Referring Provider   Date 02/01/24    Referring Provider Dr. Ned Bernardino, MD      Oxygen   Maintain Oxygen Saturation 88% or higher      Treadmill   MPH 1.8    Grade 0    Minutes 15    METs 2.38      Recumbant Bike   Level 1    RPM 50    Watts 15    Minutes 15    METs 1.67      NuStep   Level 2    SPM 80    Minutes 15    METs 1.67      Prescription Details   Frequency (times per week) 2    Duration Progress to 30 minutes of continuous aerobic without signs/symptoms of physical distress      Intensity   THRR 40-80% of Max Heartrate 87-124    Ratings of Perceived Exertion 11-13    Perceived Dyspnea 0-4      Progression   Progression Continue to progress workloads to maintain intensity without signs/symptoms of physical distress.      Resistance Training   Training Prescription Yes    Weight 5 lb    Reps 10-15          Exercise Goals: Frequency: Be able to perform aerobic exercise two to three times per week in program working toward 2-5 days per week of home exercise.  Intensity: Work with a perceived exertion of 11 (fairly light) - 15 (hard) while following your exercise prescription.  We will make changes to your prescription with you as you progress through the program.   Duration: Be able to do 30 to 45 minutes of continuous aerobic exercise in addition to a 5 minute warm-up and a 5 minute cool-down routine.   Nutrition Goals: Your personal nutrition goals  will be established when you do your nutrition analysis with the dietician.  The following are general nutrition guidelines to follow: Cholesterol < 200mg /day Sodium < 1500mg /day Fiber: Men over 50 yrs - 30 grams per day  Personal Goals:  Personal Goals and Risk Factors at Admission - 01/25/24 1422       Core Components/Risk Factors/Patient Goals on Admission    Weight Management Yes;Weight Loss    Intervention Weight Management: Develop a combined nutrition and exercise program designed to reach desired caloric intake, while maintaining appropriate intake of nutrient and fiber, sodium and fats, and appropriate energy expenditure required for the weight goal.;Weight Management: Provide education and appropriate resources to help participant work on and attain dietary goals.;Weight Management/Obesity: Establish reasonable short term and long term weight goals.;Obesity: Provide education and appropriate resources to help participant work on and attain dietary goals.    Expected Outcomes Short Term: Continue to assess and modify interventions until short term weight is achieved;Weight Maintenance: Understanding of the daily nutrition guidelines, which includes 25-35% calories from fat, 7% or less cal from saturated fats, less than 200mg  cholesterol, less than 1.5gm of  sodium, & 5 or more servings of fruits and vegetables daily;Understanding recommendations for meals to include 15-35% energy as protein, 25-35% energy from fat, 35-60% energy from carbohydrates, less than 200mg  of dietary cholesterol, 20-35 gm of total fiber daily;Weight Loss: Understanding of general recommendations for a balanced deficit meal plan, which promotes 1-2 lb weight loss per week and includes a negative energy balance of [phone] kcal/d;Understanding of distribution of calorie intake throughout the day with the consumption of 4-5 meals/snacks    Hypertension Yes    Intervention Provide education on lifestyle modifcations  including regular physical activity/exercise, weight management, moderate sodium restriction and increased consumption of fresh fruit, vegetables, and low fat dairy, alcohol moderation, and smoking cessation.;Monitor prescription use compliance.    Expected Outcomes Short Term: Continued assessment and intervention until BP is < 140/86mm HG in hypertensive participants. < 130/25mm HG in hypertensive participants with diabetes, heart failure or chronic kidney disease.;Long Term: Maintenance of blood pressure at goal levels.    Lipids Yes    Intervention Provide education and support for participant on nutrition & aerobic/resistive exercise along with prescribed medications to achieve LDL 70mg , HDL >40mg .    Expected Outcomes Short Term: Participant states understanding of desired cholesterol values and is compliant with medications prescribed. Participant is following exercise prescription and nutrition guidelines.;Long Term: Cholesterol controlled with medications as prescribed, with individualized exercise RX and with personalized nutrition plan. Value goals: LDL < 70mg , HDL > 40 mg.         Exercise Goals and Review:  Exercise Goals     Row Name 02/01/24 1152             Exercise Goals   Increase Physical Activity Yes       Intervention Provide advice, education, support and counseling about physical activity/exercise needs.;Develop an individualized exercise prescription for aerobic and resistive training based on initial evaluation findings, risk stratification, comorbidities and participant's personal goals.       Expected Outcomes Short Term: Attend rehab on a regular basis to increase amount of physical activity.;Long Term: Exercising regularly at least 3-5 days a week.;Long Term: Add in home exercise to make exercise part of routine and to increase amount of physical activity.       Increase Strength and Stamina Yes       Intervention Provide advice, education, support and counseling  about physical activity/exercise needs.;Develop an individualized exercise prescription for aerobic and resistive training based on initial evaluation findings, risk stratification, comorbidities and participant's personal goals.       Expected Outcomes Short Term: Increase workloads from initial exercise prescription for resistance, speed, and METs.;Short Term: Perform resistance training exercises routinely during rehab and add in resistance training at home;Long Term: Improve cardiorespiratory fitness, muscular endurance and strength as measured by increased METs and functional capacity ( )       Able to understand and use rate of perceived exertion (RPE) scale Yes       Intervention Provide education and explanation on how to use RPE scale       Expected Outcomes Short Term: Able to use RPE daily in rehab to express subjective intensity level;Long Term:  Able to use RPE to guide intensity level when exercising independently       Able to understand and use Dyspnea scale Yes       Intervention Provide education and explanation on how to use Dyspnea scale       Expected Outcomes Long Term: Able to use Dyspnea scale to  guide intensity level when exercising independently;Short Term: Able to use Dyspnea scale daily in rehab to express subjective sense of shortness of breath during exertion       Knowledge and understanding of Target Heart Rate Range (THRR) Yes       Intervention Provide education and explanation of THRR including how the numbers were predicted and where they are located for reference       Expected Outcomes Short Term: Able to state/look up THRR;Short Term: Able to use daily as guideline for intensity in rehab;Long Term: Able to use THRR to govern intensity when exercising independently       Able to check pulse independently Yes       Intervention Provide education and demonstration on how to check pulse in carotid and radial arteries.;Review the importance of being able to check your  own pulse for safety during independent exercise       Expected Outcomes Long Term: Able to check pulse independently and accurately;Short Term: Able to explain why pulse checking is important during independent exercise       Understanding of Exercise Prescription Yes       Intervention Provide education, explanation, and written materials on patient's individual exercise prescription       Expected Outcomes Short Term: Able to explain program exercise prescription;Long Term: Able to explain home exercise prescription to exercise independently

## 2024-02-01 NOTE — Progress Notes (Signed)
 Cardiac Individual Treatment Plan  Patient Details  Name: Joshua Stark MRN: [id number] Date of Birth: July 30, 1945 Referring Provider:   Flowsheet Row Cardiac Rehab from 02/01/2024 in [facility] Cardiac and Pulmonary Rehab  Referring Provider Dr. Debby Motto, MD    Initial Encounter Date:  Flowsheet Row Cardiac Rehab from 02/01/2024 in [facility] Cardiac and Pulmonary Rehab  Date 02/01/24    Visit Diagnosis: S/P CABG x 3  Patient's Home Medications on Admission: Current Medications[1]  Past Medical History: Past Medical History:  Diagnosis Date   Arthritis    CAD S/P percutaneous coronary angioplasty 1999   PCI- prox LAD @ D1, SP1 trifurcation.  3.0 mm x 16 mm AVE-GFX BMS   Dyspnea    History of nuclear stress test 08/2010   Treadmill Myoview : 9 minutes, 10 METS --> hypertensive response.  No evidence of ischemia or infarction.  EF 64%   HLD (hyperlipidemia)    Hypertension    Obesity (BMI 30-39.9)    Seizure disorder (HCC)    Seizures (HCC)    hx of years ago     Tobacco Use: Tobacco Use History[2]  Labs: Review Flowsheet  More data exists      Latest Ref Rng & Units 06/28/2018 07/03/2019 07/31/2020 10/02/2021 10/14/2022  Labs for ITP Cardiac and Pulmonary Rehab  Cholestrol 0 - 200 mg/dL 850  863  855  866  841   LDL (calc) 0 - 99 mg/dL 86  78  76  71  84   HDL-C >39.00 mg/dL 53.19  59.29  55.79  54.99  47.30   Trlycerides 0.0 - 149.0 mg/dL 17.9  14.9  882.9  11.9  137.0   Hemoglobin A1c 4.6 - 6.5 % - 6.2  - 6.1  5.8      Exercise Target Goals: Exercise Program Goal: Individual exercise prescription set using results from initial 6 min walk test and THRR while considering  patients activity barriers and safety.   Exercise Prescription Goal: Initial exercise prescription builds to 30-45 minutes a day of aerobic activity, 2-3 days per week.  Home exercise guidelines will be given to patient during program as part of exercise prescription that the participant will  acknowledge.   Education: Aerobic Exercise: - Group verbal and visual presentation on the components of exercise prescription. Introduces F.I.T.T principle from ACSM for exercise prescriptions.  Reviews F.I.T.T. principles of aerobic exercise including progression. Written material provided at class time.   Education: Resistance Exercise: - Group verbal and visual presentation on the components of exercise prescription. Introduces F.I.T.T principle from ACSM for exercise prescriptions  Reviews F.I.T.T. principles of resistance exercise including progression. Written material provided at class time.    Education: Exercise & Equipment Safety: - Individual verbal instruction and demonstration of equipment use and safety with use of the equipment. Flowsheet Row Cardiac Rehab from 01/25/2024 in [facility] Cardiac and Pulmonary Rehab  Date 01/25/24  Educator [facility]  Instruction Review Code 1- Verbalizes Understanding    Education: Exercise Physiology & General Exercise Guidelines: - Group verbal and written instruction with models to review the exercise physiology of the cardiovascular system and associated critical values. Provides general exercise guidelines with specific guidelines to those with heart or lung disease. Written material provided at class time.   Education: Flexibility, Balance, Mind/Body Relaxation: - Group verbal and visual presentation with interactive activity on the components of exercise prescription. Introduces F.I.T.T principle from ACSM for exercise prescriptions. Reviews F.I.T.T. principles of flexibility and balance exercise training including progression. Also discusses  the mind body connection.  Reviews various relaxation techniques to help reduce and manage stress (i.e. Deep breathing, progressive muscle relaxation, and visualization). Balance handout provided to take home. Written material provided at class time.   Activity Barriers & Risk Stratification:  Activity  Barriers & Cardiac Risk Stratification - 02/01/24 1152       Activity Barriers & Cardiac Risk Stratification   Activity Barriers Right Knee Replacement;Left Knee Replacement;Other (comment)    Comments Hx broken left femur    Cardiac Risk Stratification High          6 Minute Walk:  6 Minute Walk     Row Name 02/01/24 1151         6 Minute Walk   Phase Initial     Distance 1050 feet     Walk Time 6 minutes     # of Rest Breaks 0     MPH 1.99     METS 1.67     RPE 11     Perceived Dyspnea  2     VO2 Peak 5.85     Symptoms Yes (comment)     Comments SOB     Resting HR 51 bpm     Resting BP 148/82     Resting Oxygen Saturation  96 %     Exercise Oxygen Saturation  during 6 min walk 98 %     Max Ex. HR 86 bpm     Max Ex. BP 172/86     2 Minute Post BP 152/78        Oxygen Initial Assessment:   Oxygen Re-Evaluation:   Oxygen Discharge (Final Oxygen Re-Evaluation):   Initial Exercise Prescription:  Initial Exercise Prescription - 02/01/24 1100       Date of Initial Exercise RX and Referring Provider   Date 02/01/24    Referring Provider Dr. Debby Motto, MD      Oxygen   Maintain Oxygen Saturation 88% or higher      Treadmill   MPH 1.8    Grade 0    Minutes 15    METs 2.38      Recumbant Bike   Level 1    RPM 50    Watts 15    Minutes 15    METs 1.67      NuStep   Level 2    SPM 80    Minutes 15    METs 1.67      Prescription Details   Frequency (times per week) 2    Duration Progress to 30 minutes of continuous aerobic without signs/symptoms of physical distress      Intensity   THRR 40-80% of Max Heartrate 87-124    Ratings of Perceived Exertion 11-13    Perceived Dyspnea 0-4      Progression   Progression Continue to progress workloads to maintain intensity without signs/symptoms of physical distress.      Resistance Training   Training Prescription Yes    Weight 5 lb    Reps 10-15          Perform Capillary Blood  Glucose checks as needed.  Exercise Prescription Changes:   Exercise Prescription Changes     Row Name 02/01/24 1100             Response to Exercise   Blood Pressure (Admit) 148/82       Blood Pressure (Exercise) 172/86       Blood Pressure (Exit) 152/78  Heart Rate (Admit) 51 bpm       Heart Rate (Exercise) 86 bpm       Heart Rate (Exit) 59 bpm       Oxygen Saturation (Admit) 96 %       Oxygen Saturation (Exercise) 98 %       Rating of Perceived Exertion (Exercise) 11       Perceived Dyspnea (Exercise) 2       Symptoms SOB       Comments Results          Exercise Comments:   Exercise Goals and Review:   Exercise Goals     Row Name 02/01/24 1152             Exercise Goals   Increase Physical Activity Yes       Intervention Provide advice, education, support and counseling about physical activity/exercise needs.;Develop an individualized exercise prescription for aerobic and resistive training based on initial evaluation findings, risk stratification, comorbidities and participant's personal goals.       Expected Outcomes Short Term: Attend rehab on a regular basis to increase amount of physical activity.;Long Term: Exercising regularly at least 3-5 days a week.;Long Term: Add in home exercise to make exercise part of routine and to increase amount of physical activity.       Increase Strength and Stamina Yes       Intervention Provide advice, education, support and counseling about physical activity/exercise needs.;Develop an individualized exercise prescription for aerobic and resistive training based on initial evaluation findings, risk stratification, comorbidities and participant's personal goals.       Expected Outcomes Short Term: Increase workloads from initial exercise prescription for resistance, speed, and METs.;Short Term: Perform resistance training exercises routinely during rehab and add in resistance training at home;Long Term: Improve  cardiorespiratory fitness, muscular endurance and strength as measured by increased METs and functional capacity ( )       Able to understand and use rate of perceived exertion (RPE) scale Yes       Intervention Provide education and explanation on how to use RPE scale       Expected Outcomes Short Term: Able to use RPE daily in rehab to express subjective intensity level;Long Term:  Able to use RPE to guide intensity level when exercising independently       Able to understand and use Dyspnea scale Yes       Intervention Provide education and explanation on how to use Dyspnea scale       Expected Outcomes Long Term: Able to use Dyspnea scale to guide intensity level when exercising independently;Short Term: Able to use Dyspnea scale daily in rehab to express subjective sense of shortness of breath during exertion       Knowledge and understanding of Target Heart Rate Range (THRR) Yes       Intervention Provide education and explanation of THRR including how the numbers were predicted and where they are located for reference       Expected Outcomes Short Term: Able to state/look up THRR;Short Term: Able to use daily as guideline for intensity in rehab;Long Term: Able to use THRR to govern intensity when exercising independently       Able to check pulse independently Yes       Intervention Provide education and demonstration on how to check pulse in carotid and radial arteries.;Review the importance of being able to check your own pulse for safety during independent exercise  Expected Outcomes Long Term: Able to check pulse independently and accurately;Short Term: Able to explain why pulse checking is important during independent exercise       Understanding of Exercise Prescription Yes       Intervention Provide education, explanation, and written materials on patient's individual exercise prescription       Expected Outcomes Short Term: Able to explain program exercise prescription;Long  Term: Able to explain home exercise prescription to exercise independently          Exercise Goals Re-Evaluation :   Discharge Exercise Prescription (Final Exercise Prescription Changes):  Exercise Prescription Changes - 02/01/24 1100       Response to Exercise   Blood Pressure (Admit) 148/82    Blood Pressure (Exercise) 172/86    Blood Pressure (Exit) 152/78    Heart Rate (Admit) 51 bpm    Heart Rate (Exercise) 86 bpm    Heart Rate (Exit) 59 bpm    Oxygen Saturation (Admit) 96 %    Oxygen Saturation (Exercise) 98 %    Rating of Perceived Exertion (Exercise) 11    Perceived Dyspnea (Exercise) 2    Symptoms SOB    Comments Results          Nutrition:  Target Goals: Understanding of nutrition guidelines, daily intake of sodium 1500mg , cholesterol 200mg , calories 30% from fat and 7% or less from saturated fats, daily to have 5 or more servings of fruits and vegetables.  Education: Nutrition 1 -Group instruction provided by verbal, written material, interactive activities, discussions, models, and posters to present general guidelines for heart healthy nutrition including macronutrients, label reading, and promoting whole foods over processed counterparts. Education serves as pensions consultant of discussion of heart healthy eating for all. Written material provided at class time.    Education: Nutrition 2 -Group instruction provided by verbal, written material, interactive activities, discussions, models, and posters to present general guidelines for heart healthy nutrition including sodium, cholesterol, and saturated fat. Providing guidance of habit forming to improve blood pressure, cholesterol, and body weight. Written material provided at class time.     Biometrics:  Pre Biometrics - 02/01/24 1153       Pre Biometrics   Height 5' 10.3 (1.786 m)    Weight 257 lb 14.4 oz (117 kg)    Waist Circumference 50 inches    Hip Circumference 47 inches    Waist to Hip Ratio  1.06 %    BMI (Calculated) 36.67    Single Leg Stand 4.6 seconds           Nutrition Therapy Plan and Nutrition Goals:  Nutrition Therapy & Goals - 02/01/24 1129       Nutrition Therapy   Diet Cardiac, Low Na    Protein (specify units) 65    Fiber 30 grams    Whole Grain Foods 3 servings    Saturated Fats 15 max. grams    Fruits and Vegetables 5 servings/day    Sodium 2 grams      Personal Nutrition Goals   Nutrition Goal Read labels and reduce sodium intake to below 2300mg . Ideally 1500mg  per day.    Personal Goal #2 Reduce saturated fat, less than 12g per day. Replace bad fats for more heart healthy fats.    Personal Goal #3 Eat 15-30gProtein and 30-60gCarbs at each meal.    Comments Viyan is drinking mostly water . He eats 3 meals per day, has been cutting back on processed meats since his surgery. Says he used to  eat bacon most mornings but not anymore. Reviewed Mediterranean diet handout. Educated on types of fats, sources, and how to read labels. Went over protein carb pairing and brainstormed several meals and snacks with foods he likes and will eat focusing lowering sodium and meeting nutritional goals like fiber, protein, and calories.      Intervention Plan   Intervention Prescribe, educate and counsel regarding individualized specific dietary modifications aiming towards targeted core components such as weight, hypertension, lipid management, diabetes, heart failure and other comorbidities.;Nutrition handout(s) given to patient.    Expected Outcomes Short Term Goal: Understand basic principles of dietary content, such as calories, fat, sodium, cholesterol and nutrients.;Short Term Goal: A plan has been developed with personal nutrition goals set during dietitian appointment.;Long Term Goal: Adherence to prescribed nutrition plan.          Nutrition Assessments:  MEDIFICTS Score Key: >=70 Need to make dietary changes  40-70 Heart Healthy Diet <= 40 Therapeutic Level  Cholesterol Diet   Picture Your Plate Scores: <59 Unhealthy dietary pattern with much room for improvement. 41-50 Dietary pattern unlikely to meet recommendations for good health and room for improvement. 51-60 More healthful dietary pattern, with some room for improvement.  >60 Healthy dietary pattern, although there may be some specific behaviors that could be improved.    Nutrition Goals Re-Evaluation:   Nutrition Goals Discharge (Final Nutrition Goals Re-Evaluation):   Psychosocial: Target Goals: Acknowledge presence or absence of significant depression and/or stress, maximize coping skills, provide positive support system. Participant is able to verbalize types and ability to use techniques and skills needed for reducing stress and depression.   Education: Stress, Anxiety, and Depression - Group verbal and visual presentation to define topics covered.  Reviews how body is impacted by stress, anxiety, and depression.  Also discusses healthy ways to reduce stress and to treat/manage anxiety and depression. Written material provided at class time.   Education: Sleep Hygiene -Provides group verbal and written instruction about how sleep can affect your health.  Define sleep hygiene, discuss sleep cycles and impact of sleep habits. Review good sleep hygiene tips.   Initial Review & Psychosocial Screening:  Initial Psych Review & Screening - 01/25/24 1423       Initial Review   Current issues with None Identified      Family Dynamics   Good Support System? Yes    Comments He can look to his wfe and kids for support. He states no mental instability and takes no medication for his mood.      Barriers   Psychosocial barriers to participate in program There are no identifiable barriers or psychosocial needs.;The patient should benefit from training in stress management and relaxation.      Screening Interventions   Interventions Encouraged to exercise;To provide support and  resources with identified psychosocial needs;Provide feedback about the scores to participant    Expected Outcomes Short Term goal: Utilizing psychosocial counselor, staff and physician to assist with identification of specific Stressors or current issues interfering with healing process. Setting desired goal for each stressor or current issue identified.;Long Term Goal: Stressors or current issues are controlled or eliminated.;Short Term goal: Identification and review with participant of any Quality of Life or Depression concerns found by scoring the questionnaire.;Long Term goal: The participant improves quality of Life and PHQ9 Scores as seen by post scores and/or verbalization of changes          Quality of Life Scores:   Scores of 19 and below  usually indicate a poorer quality of life in these areas.  A difference of  2-3 points is a clinically meaningful difference.  A difference of 2-3 points in the total score of the Quality of Life Index has been associated with significant improvement in overall quality of life, self-image, physical symptoms, and general health in studies assessing change in quality of life.  PHQ-9: Review Flowsheet  More data exists      02/01/2024 10/28/2022 09/29/2022 07/21/2021 07/17/2020  Depression screen PHQ 2/9  Decreased Interest 0 0 0 0 0  Down, Depressed, Hopeless 0 0 0 0 0  PHQ - 2 Score 0 0 0 0 0  Altered sleeping 0 0 - - 0  Tired, decreased energy 3 0 - - 0  Change in appetite 0 0 - - 0  Feeling bad or failure about yourself  0 0 - - 0  Trouble concentrating 0 0 - - 0  Moving slowly or fidgety/restless 0 0 - - 0  Suicidal thoughts 0 0 - - 0  PHQ-9 Score 3 0  - - 0   Difficult doing work/chores Not difficult at all Not difficult at all - - Not difficult at all    Details       Data saved with a previous flowsheet row definition        Interpretation of Total Score  Total Score Depression Severity:  1-4 = Minimal depression, 5-9 = Mild  depression, 10-14 = Moderate depression, 15-19 = Moderately severe depression, 20-27 = Severe depression   Psychosocial Evaluation and Intervention:  Psychosocial Evaluation - 01/25/24 1425       Psychosocial Evaluation & Interventions   Interventions Encouraged to exercise with the program and follow exercise prescription;Relaxation education;Stress management education    Comments He can look to his wfe and kids for support. He states no mental instability and takes no medication for his mood.    Expected Outcomes Short: Start HeartTracks to help with mood. Long: Maintain a healthy mental state    Continue Psychosocial Services  Follow up required by staff          Psychosocial Re-Evaluation:   Psychosocial Discharge (Final Psychosocial Re-Evaluation):   Vocational Rehabilitation: Provide vocational rehab assistance to qualifying candidates.   Vocational Rehab Evaluation & Intervention:  Vocational Rehab - 01/25/24 1423       Initial Vocational Rehab Evaluation & Intervention   Assessment shows need for Vocational Rehabilitation No          Education: Education Goals: Education classes will be provided on a variety of topics geared toward better understanding of heart health and risk factor modification. Participant will state understanding/return demonstration of topics presented as noted by education test scores.  Learning Barriers/Preferences:  Learning Barriers/Preferences - 01/25/24 1423       Learning Barriers/Preferences   Learning Barriers None    Learning Preferences None          General Cardiac Education Topics:  AED/CPR: - Group verbal and written instruction with the use of models to demonstrate the basic use of the AED with the basic ABC's of resuscitation.   Test and Procedures: - Group verbal and visual presentation and models provide information about basic cardiac anatomy and function. Reviews the testing methods done to diagnose heart  disease and the outcomes of the test results. Describes the treatment choices: Medical Management, Angioplasty, or Coronary Bypass Surgery for treating various heart conditions including Myocardial Infarction, Angina, Valve Disease, and Cardiac Arrhythmias. Written material  provided at class time.   Medication Safety: - Group verbal and visual instruction to review commonly prescribed medications for heart and lung disease. Reviews the medication, class of the drug, and side effects. Includes the steps to properly store meds and maintain the prescription regimen. Written material provided at class time.   Intimacy: - Group verbal instruction through game format to discuss how heart and lung disease can affect sexual intimacy. Written material provided at class time.   Know Your Numbers and Heart Failure: - Group verbal and visual instruction to discuss disease risk factors for cardiac and pulmonary disease and treatment options.  Reviews associated critical values for Overweight/Obesity, Hypertension, Cholesterol, and Diabetes.  Discusses basics of heart failure: signs/symptoms and treatments.  Introduces Heart Failure Zone chart for action plan for heart failure. Written material provided at class time.   Infection Prevention: - Provides verbal and written material to individual with discussion of infection control including proper hand washing and proper equipment cleaning during exercise session. Flowsheet Row Cardiac Rehab from 01/25/2024 in [facility] Cardiac and Pulmonary Rehab  Date 01/25/24  Educator [facility]  Instruction Review Code 1- Verbalizes Understanding    Falls Prevention: - Provides verbal and written material to individual with discussion of falls prevention and safety. Flowsheet Row Cardiac Rehab from 01/25/2024 in [facility] Cardiac and Pulmonary Rehab  Date 01/25/24  Educator [facility]  Instruction Review Code 1- Verbalizes Understanding    Other: -Provides group and verbal instruction  on various topics (see comments)   Knowledge Questionnaire Score:   Core Components/Risk Factors/Patient Goals at Admission:  Personal Goals and Risk Factors at Admission - 01/25/24 1422       Core Components/Risk Factors/Patient Goals on Admission    Weight Management Yes;Weight Loss    Intervention Weight Management: Develop a combined nutrition and exercise program designed to reach desired caloric intake, while maintaining appropriate intake of nutrient and fiber, sodium and fats, and appropriate energy expenditure required for the weight goal.;Weight Management: Provide education and appropriate resources to help participant work on and attain dietary goals.;Weight Management/Obesity: Establish reasonable short term and long term weight goals.;Obesity: Provide education and appropriate resources to help participant work on and attain dietary goals.    Expected Outcomes Short Term: Continue to assess and modify interventions until short term weight is achieved;Weight Maintenance: Understanding of the daily nutrition guidelines, which includes 25-35% calories from fat, 7% or less cal from saturated fats, less than 200mg  cholesterol, less than 1.5gm of sodium, & 5 or more servings of fruits and vegetables daily;Understanding recommendations for meals to include 15-35% energy as protein, 25-35% energy from fat, 35-60% energy from carbohydrates, less than 200mg  of dietary cholesterol, 20-35 gm of total fiber daily;Weight Loss: Understanding of general recommendations for a balanced deficit meal plan, which promotes 1-2 lb weight loss per week and includes a negative energy balance of [phone] kcal/d;Understanding of distribution of calorie intake throughout the day with the consumption of 4-5 meals/snacks    Hypertension Yes    Intervention Provide education on lifestyle modifcations including regular physical activity/exercise, weight management, moderate sodium restriction and increased consumption  of fresh fruit, vegetables, and low fat dairy, alcohol moderation, and smoking cessation.;Monitor prescription use compliance.    Expected Outcomes Short Term: Continued assessment and intervention until BP is < 140/97mm HG in hypertensive participants. < 130/47mm HG in hypertensive participants with diabetes, heart failure or chronic kidney disease.;Long Term: Maintenance of blood pressure at goal levels.    Lipids Yes  Intervention Provide education and support for participant on nutrition & aerobic/resistive exercise along with prescribed medications to achieve LDL 70mg , HDL >40mg .    Expected Outcomes Short Term: Participant states understanding of desired cholesterol values and is compliant with medications prescribed. Participant is following exercise prescription and nutrition guidelines.;Long Term: Cholesterol controlled with medications as prescribed, with individualized exercise RX and with personalized nutrition plan. Value goals: LDL < 70mg , HDL > 40 mg.          Education:Diabetes - Individual verbal and written instruction to review signs/symptoms of diabetes, desired ranges of glucose level fasting, after meals and with exercise. Acknowledge that pre and post exercise glucose checks will be done for 3 sessions at entry of program.   Core Components/Risk Factors/Patient Goals Review:    Core Components/Risk Factors/Patient Goals at Discharge (Final Review):    ITP Comments:  ITP Comments     Row Name 01/25/24 1427 02/01/24 1146         ITP Comments Virtual Visit completed. Patient informed on EP and RD appointment and 6 Minute walk test. Patient also informed of patient health questionnaires on My Chart. Patient Verbalizes understanding. Visit diagnosis can be found in [facility] 12/13/2023. Completed and gym orientation for cardiac rehab. Initial ITP created and sent for review to Dr. Oneil Pinal, Medical Director.         Comments: Initial ITP    [1]  Current  Outpatient Medications:    amLODipine  (NORVASC ) 5 MG tablet, Take 2 tablets (10 mg total) by mouth daily. (Patient not taking: Reported on 01/25/2024), Disp: 30 tablet, Rfl: 10   Ascorbic Acid (VITAMIN C) 500 MG CAPS, Take 500 mg by mouth daily. (Patient not taking: Reported on 01/25/2024), Disp: , Rfl:    Ascorbic Acid 500 MG CAPS, Take 1,000 mg by mouth. (Patient not taking: Reported on 01/25/2024), Disp: , Rfl:    cloNIDine  (CATAPRES ) 0.1 MG tablet, Take 0.1 mg by mouth daily. (Patient not taking: Reported on 01/25/2024), Disp: , Rfl:    DEPAKOTE  250 MG DR tablet, TAKE 1 TABLET BY MOUTH TWICE DAILY, Disp: 180 tablet, Rfl: 3   ELIQUIS 5 MG TABS tablet, SMARTSIG:1 Tablet(s) By Mouth Every 12 Hours, Disp: , Rfl:    Evolocumab (REPATHA) 140 MG/ML SOSY, Inject 140 mg into the skin every 14 (fourteen) days., Disp: , Rfl:    furosemide  (LASIX ) 20 MG tablet, Take 20 mg by mouth. (Patient not taking: Reported on 01/25/2024), Disp: , Rfl:    furosemide  (LASIX ) 40 MG tablet, Take 40 mg by mouth 2 (two) times daily. (Patient not taking: Reported on 01/25/2024), Disp: , Rfl:    Garlic 500 MG TABS, Take 500 mg by mouth daily. (Patient not taking: Reported on 01/25/2024), Disp: , Rfl:    hydrALAZINE (APRESOLINE) 25 MG tablet, Take 25 mg by mouth 2 (two) times daily., Disp: , Rfl:    hydrochlorothiazide  (HYDRODIURIL ) 25 MG tablet, Take 25 mg by mouth daily., Disp: , Rfl:    hydrochlorothiazide  (MICROZIDE ) 12.5 MG capsule, Take 12.5 mg by mouth daily. (Patient not taking: Reported on 01/25/2024), Disp: , Rfl:    losartan (COZAAR) 50 MG tablet, Take 50 mg by mouth daily., Disp: , Rfl:    methocarbamol  (ROBAXIN ) 500 MG tablet, Take 1 tablet (500 mg total) by mouth every 6 (six) hours as needed for muscle spasms. (Patient not taking: Reported on 01/25/2024), Disp: 40 tablet, Rfl: 2   metoprolol tartrate (LOPRESSOR) 25 MG tablet, Take 25 mg by mouth daily.,  Disp: , Rfl:    Multiple Vitamin (MULTIVITAMIN) capsule,  Take 1 capsule by mouth daily.  (Patient not taking: Reported on 01/25/2024), Disp: , Rfl:    NITROSTAT  0.4 MG SL tablet, DISSOLVE 1 TABLET UNDER THE TONGUE FOR CHEST PAIN. MAY REPEAT EVERY UP TO 3 DOSES. IF NO RELIEF, CALL 911** (Patient not taking: Reported on 01/25/2024), Disp: 25 tablet, Rfl: 2   omeprazole (PRILOSEC) 40 MG capsule, Take 40 mg by mouth., Disp: , Rfl:    oxyCODONE  (OXY IR/ROXICODONE ) 5 MG immediate release tablet, Take 1 tablet (5 mg total) by mouth every 4 (four) hours as needed for severe pain (pain score 7-10). (Patient not taking: Reported on 01/25/2024), Disp: 42 tablet, Rfl: 0   polyethylene glycol powder (GLYCOLAX /MIRALAX ) 17 GM/SCOOP powder, Take 17 g mixed in liquid by mouth 2 (two) times daily. (Patient not taking: Reported on 01/25/2024), Disp: 238 g, Rfl: 0   potassium chloride SA (KLOR-CON M) 20 MEQ tablet, Take 20 mEq by mouth 2 (two) times daily., Disp: , Rfl:    REPATHA SURECLICK 140 MG/ML SOAJ, SMARTSIG:140 Milligram(s) SUB-Q Every 2 Weeks, Disp: , Rfl:    tirzepatide (ZEPBOUND) 2.5 MG/0.5ML injection vial, Inject 2.5 mg into the skin. (Patient not taking: Reported on 01/25/2024), Disp: , Rfl:    valsartan (DIOVAN) 320 MG tablet, Take 320 mg by mouth daily. (Patient not taking: Reported on 01/25/2024), Disp: , Rfl:  [2]  Social History Tobacco Use  Smoking Status Never  Smokeless Tobacco Never

## 2024-02-02 ENCOUNTER — Encounter: Admitting: Emergency Medicine

## 2024-02-02 DIAGNOSIS — Z951 Presence of aortocoronary bypass graft: Secondary | ICD-10-CM

## 2024-02-02 DIAGNOSIS — Z48812 Encounter for surgical aftercare following surgery on the circulatory system: Secondary | ICD-10-CM | POA: Diagnosis not present

## 2024-02-02 NOTE — Progress Notes (Signed)
 Daily Session Note  Patient Details  Name: Joshua Stark MRN: [id number] Date of Birth: Nov 18, 1945 Referring Provider:   Flowsheet Row Cardiac Rehab from 02/01/2024 in [facility] Cardiac [facility] Re[facility]  Referring Provider Dr. Debby Motto, MD    Encounter Date: 02/02/2024  Check In:  Session Check In - 02/02/24 0932       Check-In   Supervising physician immediately available to respond to emergencies See telemetry face sheet for immediately available ER MD    Location [facility]-Cardiac & Pulmonary Rehab    Staff Present Leita Franks RN,BSN;[facility] [place], [place], Exercise Physiologist    Virtual Visit No    Medication changes reported     No    Fall or balance concerns reported    No    Warm-up and Cool-down Performed on first and last piece of equipment    Resistance Training Performed Yes    VAD Patient? No    PAD/SET Patient? No      Pain Assessment   Currently in Pain? No/denies             Tobacco Use History[1]  Goals Met:  Independence with exercise equipment Exercise tolerated well No report of concerns or symptoms today Strength training completed today  Goals Unmet:  Not Applicable  Comments: First full day of exercise!  Patient was oriented to gym and equipment including functions, settings, policies, and procedures.  Patient's individual exercise prescription and treatment plan were reviewed.  All starting workloads were established based on the results of the 6 minute walk test done at initial orientation visit.  The plan for exercise progression was also introduced and progression will be customized based on patient's performance and goals.     Dr. Oneil Pinal is Medical Director for [facility] Cardiac Rehabilitation.  Dr. Fuad Aleskerov is Medical Director for [facility] Pulmonary Rehabilitation.    [1]  Social History Tobacco Use  Smoking Status Never  Smokeless Tobacco Never

## 2024-02-07 ENCOUNTER — Encounter

## 2024-02-07 DIAGNOSIS — Z48812 Encounter for surgical aftercare following surgery on the circulatory system: Secondary | ICD-10-CM | POA: Diagnosis not present

## 2024-02-07 DIAGNOSIS — Z951 Presence of aortocoronary bypass graft: Secondary | ICD-10-CM

## 2024-02-07 NOTE — Progress Notes (Signed)
 Daily Session Note  Patient Details  Name: Joshua Stark MRN: [id number] Date of Birth: 1945-11-09 Referring Provider:   Flowsheet Row Cardiac Rehab from 02/01/2024 in [facility] Cardiac and Pulmonary Rehab  Referring Provider Dr. Debby Motto, MD    Encounter Date: 02/07/2024  Check In:  Session Check In - 02/07/24 0911       Check-In   Supervising physician immediately available to respond to emergencies See telemetry face sheet for immediately available ER MD    Location [facility]-Cardiac & Pulmonary Rehab    Staff Present Burnard Davenport RN,BSN,MPA;Maxon Conetta BS, Exercise Physiologist;Margaret Best, MS, Exercise Physiologist;Jason Elnor RDN,LDN    Virtual Visit No    Medication changes reported     No    Fall or balance concerns reported    No    Warm-up and Cool-down Performed on first and last piece of equipment    Resistance Training Performed Yes    VAD Patient? No    PAD/SET Patient? No      Pain Assessment   Currently in Pain? No/denies             Tobacco Use History[1]  Goals Met:  Independence with exercise equipment Exercise tolerated well No report of concerns or symptoms today Strength training completed today  Goals Unmet:  Not Applicable  Comments: Pt able to follow exercise prescription today without complaint.  Will continue to monitor for progression.    Dr. Oneil Pinal is Medical Director for [facility] Cardiac Rehabilitation.  Dr. Fuad Aleskerov is Medical Director for [facility] Pulmonary Rehabilitation.    [1]  Social History Tobacco Use  Smoking Status Never  Smokeless Tobacco Never

## 2024-02-08 ENCOUNTER — Encounter: Payer: Self-pay | Admitting: *Deleted

## 2024-02-08 DIAGNOSIS — Z951 Presence of aortocoronary bypass graft: Secondary | ICD-10-CM

## 2024-02-08 NOTE — Progress Notes (Signed)
 Cardiac Individual Treatment Plan  Patient Details  Name: Joshua Stark MRN: [id number] Date of Birth: Jun 18, 1945 Referring Provider:   Flowsheet Row Cardiac Rehab from 02/01/2024 in [facility] Cardiac and Pulmonary Rehab  Referring Provider Dr. Debby Motto, MD    Initial Encounter Date:  Flowsheet Row Cardiac Rehab from 02/01/2024 in [facility] Cardiac and Pulmonary Rehab  Date 02/01/24    Visit Diagnosis: S/P CABG x 3  Patient's Home Medications on Admission: Current Medications[1]  Past Medical History: Past Medical History:  Diagnosis Date   Arthritis    CAD S/P percutaneous coronary angioplasty 1999   PCI- prox LAD @ D1, SP1 trifurcation.  3.0 mm x 16 mm AVE-GFX BMS   Dyspnea    History of nuclear stress test 08/2010   Treadmill Myoview : 9 minutes, 10 METS --> hypertensive response.  No evidence of ischemia or infarction.  EF 64%   HLD (hyperlipidemia)    Hypertension    Obesity (BMI 30-39.9)    Seizure disorder (HCC)    Seizures (HCC)    hx of years ago     Tobacco Use: Tobacco Use History[2]  Labs: Review Flowsheet  More data exists      Latest Ref Rng & Units 06/28/2018 07/03/2019 07/31/2020 10/02/2021 10/14/2022  Labs for ITP Cardiac and Pulmonary Rehab  Cholestrol 0 - 200 mg/dL 850  863  855  866  841   LDL (calc) 0 - 99 mg/dL 86  78  76  71  84   HDL-C >39.00 mg/dL 53.19  59.29  55.79  54.99  47.30   Trlycerides 0.0 - 149.0 mg/dL 17.9  14.9  882.9  11.9  137.0   Hemoglobin A1c 4.6 - 6.5 % - 6.2  - 6.1  5.8      Exercise Target Goals: Exercise Program Goal: Individual exercise prescription set using results from initial 6 min walk test and THRR while considering  patients activity barriers and safety.   Exercise Prescription Goal: Initial exercise prescription builds to 30-45 minutes a day of aerobic activity, 2-3 days per week.  Home exercise guidelines will be given to patient during program as part of exercise prescription that the participant will  acknowledge.   Education: Aerobic Exercise: - Group verbal and visual presentation on the components of exercise prescription. Introduces F.I.T.T principle from ACSM for exercise prescriptions.  Reviews F.I.T.T. principles of aerobic exercise including progression. Written material provided at class time.   Education: Resistance Exercise: - Group verbal and visual presentation on the components of exercise prescription. Introduces F.I.T.T principle from ACSM for exercise prescriptions  Reviews F.I.T.T. principles of resistance exercise including progression. Written material provided at class time.    Education: Exercise & Equipment Safety: - Individual verbal instruction and demonstration of equipment use and safety with use of the equipment. Flowsheet Row Cardiac Rehab from 02/02/2024 in [facility] Cardiac and Pulmonary Rehab  Date 01/25/24  Educator [facility]  Instruction Review Code 1- Verbalizes Understanding    Education: Exercise Physiology & General Exercise Guidelines: - Group verbal and written instruction with models to review the exercise physiology of the cardiovascular system and associated critical values. Provides general exercise guidelines with specific guidelines to those with heart or lung disease. Written material provided at class time.   Education: Flexibility, Balance, Mind/Body Relaxation: - Group verbal and visual presentation with interactive activity on the components of exercise prescription. Introduces F.I.T.T principle from ACSM for exercise prescriptions. Reviews F.I.T.T. principles of flexibility and balance exercise training including progression. Also discusses  the mind body connection.  Reviews various relaxation techniques to help reduce and manage stress (i.e. Deep breathing, progressive muscle relaxation, and visualization). Balance handout provided to take home. Written material provided at class time.   Activity Barriers & Risk Stratification:  Activity Barriers  & Cardiac Risk Stratification - 02/01/24 1152       Activity Barriers & Cardiac Risk Stratification   Activity Barriers Right Knee Replacement;Left Knee Replacement;Other (comment)    Comments Hx broken left femur    Cardiac Risk Stratification High          6 Minute Walk:  6 Minute Walk     Row Name 02/01/24 1151         6 Minute Walk   Phase Initial     Distance 1050 feet     Walk Time 6 minutes     # of Rest Breaks 0     MPH 1.99     METS 1.67     RPE 11     Perceived Dyspnea  2     VO2 Peak 5.85     Symptoms Yes (comment)     Comments SOB     Resting HR 51 bpm     Resting BP 148/82     Resting Oxygen Saturation  96 %     Exercise Oxygen Saturation  during 6 min walk 98 %     Max Ex. HR 86 bpm     Max Ex. BP 172/86     2 Minute Post BP 152/78        Oxygen Initial Assessment:   Oxygen Re-Evaluation:   Oxygen Discharge (Final Oxygen Re-Evaluation):   Initial Exercise Prescription:  Initial Exercise Prescription - 02/01/24 1100       Date of Initial Exercise RX and Referring Provider   Date 02/01/24    Referring Provider Dr. Debby Motto, MD      Oxygen   Maintain Oxygen Saturation 88% or higher      Treadmill   MPH 1.8    Grade 0    Minutes 15    METs 2.38      Recumbant Bike   Level 1    RPM 50    Watts 15    Minutes 15    METs 1.67      NuStep   Level 2    SPM 80    Minutes 15    METs 1.67      Prescription Details   Frequency (times per week) 2    Duration Progress to 30 minutes of continuous aerobic without signs/symptoms of physical distress      Intensity   THRR 40-80% of Max Heartrate 87-124    Ratings of Perceived Exertion 11-13    Perceived Dyspnea 0-4      Progression   Progression Continue to progress workloads to maintain intensity without signs/symptoms of physical distress.      Resistance Training   Training Prescription Yes    Weight 5 lb    Reps 10-15          Perform Capillary Blood Glucose checks  as needed.  Exercise Prescription Changes:   Exercise Prescription Changes     Row Name 02/01/24 1100             Response to Exercise   Blood Pressure (Admit) 148/82       Blood Pressure (Exercise) 172/86       Blood Pressure (Exit) 152/78  Heart Rate (Admit) 51 bpm       Heart Rate (Exercise) 86 bpm       Heart Rate (Exit) 59 bpm       Oxygen Saturation (Admit) 96 %       Oxygen Saturation (Exercise) 98 %       Rating of Perceived Exertion (Exercise) 11       Perceived Dyspnea (Exercise) 2       Symptoms SOB       Comments Results          Exercise Comments:   Exercise Comments     Row Name 02/02/24 0932           Exercise Comments First full day of exercise!  Patient was oriented to gym and equipment including functions, settings, policies, and procedures.  Patient's individual exercise prescription and treatment plan were reviewed.  All starting workloads were established based on the results of the 6 minute walk test done at initial orientation visit.  The plan for exercise progression was also introduced and progression will be customized based on patient's performance and goals.          Exercise Goals and Review:   Exercise Goals     Row Name 02/01/24 1152             Exercise Goals   Increase Physical Activity Yes       Intervention Provide advice, education, support and counseling about physical activity/exercise needs.;Develop an individualized exercise prescription for aerobic and resistive training based on initial evaluation findings, risk stratification, comorbidities and participant's personal goals.       Expected Outcomes Short Term: Attend rehab on a regular basis to increase amount of physical activity.;Long Term: Exercising regularly at least 3-5 days a week.;Long Term: Add in home exercise to make exercise part of routine and to increase amount of physical activity.       Increase Strength and Stamina Yes       Intervention  Provide advice, education, support and counseling about physical activity/exercise needs.;Develop an individualized exercise prescription for aerobic and resistive training based on initial evaluation findings, risk stratification, comorbidities and participant's personal goals.       Expected Outcomes Short Term: Increase workloads from initial exercise prescription for resistance, speed, and METs.;Short Term: Perform resistance training exercises routinely during rehab and add in resistance training at home;Long Term: Improve cardiorespiratory fitness, muscular endurance and strength as measured by increased METs and functional capacity ( )       Able to understand and use rate of perceived exertion (RPE) scale Yes       Intervention Provide education and explanation on how to use RPE scale       Expected Outcomes Short Term: Able to use RPE daily in rehab to express subjective intensity level;Long Term:  Able to use RPE to guide intensity level when exercising independently       Able to understand and use Dyspnea scale Yes       Intervention Provide education and explanation on how to use Dyspnea scale       Expected Outcomes Long Term: Able to use Dyspnea scale to guide intensity level when exercising independently;Short Term: Able to use Dyspnea scale daily in rehab to express subjective sense of shortness of breath during exertion       Knowledge and understanding of Target Heart Rate Range (THRR) Yes       Intervention Provide education and explanation of THRR including  how the numbers were predicted and where they are located for reference       Expected Outcomes Short Term: Able to state/look up THRR;Short Term: Able to use daily as guideline for intensity in rehab;Long Term: Able to use THRR to govern intensity when exercising independently       Able to check pulse independently Yes       Intervention Provide education and demonstration on how to check pulse in carotid and radial  arteries.;Review the importance of being able to check your own pulse for safety during independent exercise       Expected Outcomes Long Term: Able to check pulse independently and accurately;Short Term: Able to explain why pulse checking is important during independent exercise       Understanding of Exercise Prescription Yes       Intervention Provide education, explanation, and written materials on patient's individual exercise prescription       Expected Outcomes Short Term: Able to explain program exercise prescription;Long Term: Able to explain home exercise prescription to exercise independently          Exercise Goals Re-Evaluation :  Exercise Goals Re-Evaluation     Row Name 02/02/24 0932             Exercise Goal Re-Evaluation   Exercise Goals Review Increase Physical Activity;Able to understand and use rate of perceived exertion (RPE) scale;Knowledge and understanding of Target Heart Rate Range (THRR);Understanding of Exercise Prescription;Increase Strength and Stamina;Able to check pulse independently;Able to understand and use Dyspnea scale       Comments Reviewed RPE and dyspnea scale, THR and program prescription with pt today.  Pt voiced understanding and was given a copy of goals to take home.       Expected Outcomes Short: Use RPE daily to regulate intensity.  Long: Follow program prescription in THR.          Discharge Exercise Prescription (Final Exercise Prescription Changes):  Exercise Prescription Changes - 02/01/24 1100       Response to Exercise   Blood Pressure (Admit) 148/82    Blood Pressure (Exercise) 172/86    Blood Pressure (Exit) 152/78    Heart Rate (Admit) 51 bpm    Heart Rate (Exercise) 86 bpm    Heart Rate (Exit) 59 bpm    Oxygen Saturation (Admit) 96 %    Oxygen Saturation (Exercise) 98 %    Rating of Perceived Exertion (Exercise) 11    Perceived Dyspnea (Exercise) 2    Symptoms SOB    Comments Results          Nutrition:   Target Goals: Understanding of nutrition guidelines, daily intake of sodium 1500mg , cholesterol 200mg , calories 30% from fat and 7% or less from saturated fats, daily to have 5 or more servings of fruits and vegetables.  Education: Nutrition 1 -Group instruction provided by verbal, written material, interactive activities, discussions, models, and posters to present general guidelines for heart healthy nutrition including macronutrients, label reading, and promoting whole foods over processed counterparts. Education serves as pensions consultant of discussion of heart healthy eating for all. Written material provided at class time.    Education: Nutrition 2 -Group instruction provided by verbal, written material, interactive activities, discussions, models, and posters to present general guidelines for heart healthy nutrition including sodium, cholesterol, and saturated fat. Providing guidance of habit forming to improve blood pressure, cholesterol, and body weight. Written material provided at class time.     Biometrics:  Pre Biometrics -  02/01/24 1153       Pre Biometrics   Height 5' 10.3 (1.786 m)    Weight 257 lb 14.4 oz (117 kg)    Waist Circumference 50 inches    Hip Circumference 47 inches    Waist to Hip Ratio 1.06 %    BMI (Calculated) 36.67    Single Leg Stand 4.6 seconds           Nutrition Therapy Plan and Nutrition Goals:  Nutrition Therapy & Goals - 02/01/24 1129       Nutrition Therapy   Diet Cardiac, Low Na    Protein (specify units) 65    Fiber 30 grams    Whole Grain Foods 3 servings    Saturated Fats 15 max. grams    Fruits and Vegetables 5 servings/day    Sodium 2 grams      Personal Nutrition Goals   Nutrition Goal Read labels and reduce sodium intake to below 2300mg . Ideally 1500mg  per day.    Personal Goal #2 Reduce saturated fat, less than 12g per day. Replace bad fats for more heart healthy fats.    Personal Goal #3 Eat 15-30gProtein and 30-60gCarbs  at each meal.    Comments Leiland is drinking mostly water . He eats 3 meals per day, has been cutting back on processed meats since his surgery. Says he used to eat bacon most mornings but not anymore. Reviewed Mediterranean diet handout. Educated on types of fats, sources, and how to read labels. Went over protein carb pairing and brainstormed several meals and snacks with foods he likes and will eat focusing lowering sodium and meeting nutritional goals like fiber, protein, and calories.      Intervention Plan   Intervention Prescribe, educate and counsel regarding individualized specific dietary modifications aiming towards targeted core components such as weight, hypertension, lipid management, diabetes, heart failure and other comorbidities.;Nutrition handout(s) given to patient.    Expected Outcomes Short Term Goal: Understand basic principles of dietary content, such as calories, fat, sodium, cholesterol and nutrients.;Short Term Goal: A plan has been developed with personal nutrition goals set during dietitian appointment.;Long Term Goal: Adherence to prescribed nutrition plan.          Nutrition Assessments:  MEDIFICTS Score Key: >=70 Need to make dietary changes  40-70 Heart Healthy Diet <= 40 Therapeutic Level Cholesterol Diet   Picture Your Plate Scores: <59 Unhealthy dietary pattern with much room for improvement. 41-50 Dietary pattern unlikely to meet recommendations for good health and room for improvement. 51-60 More healthful dietary pattern, with some room for improvement.  >60 Healthy dietary pattern, although there may be some specific behaviors that could be improved.    Nutrition Goals Re-Evaluation:   Nutrition Goals Discharge (Final Nutrition Goals Re-Evaluation):   Psychosocial: Target Goals: Acknowledge presence or absence of significant depression and/or stress, maximize coping skills, provide positive support system. Participant is able to verbalize types  and ability to use techniques and skills needed for reducing stress and depression.   Education: Stress, Anxiety, and Depression - Group verbal and visual presentation to define topics covered.  Reviews how body is impacted by stress, anxiety, and depression.  Also discusses healthy ways to reduce stress and to treat/manage anxiety and depression. Written material provided at class time.   Education: Sleep Hygiene -Provides group verbal and written instruction about how sleep can affect your health.  Define sleep hygiene, discuss sleep cycles and impact of sleep habits. Review good sleep hygiene tips.   Initial  Review & Psychosocial Screening:  Initial Psych Review & Screening - 01/25/24 1423       Initial Review   Current issues with None Identified      Family Dynamics   Good Support System? Yes    Comments He can look to his wfe and kids for support. He states no mental instability and takes no medication for his mood.      Barriers   Psychosocial barriers to participate in program There are no identifiable barriers or psychosocial needs.;The patient should benefit from training in stress management and relaxation.      Screening Interventions   Interventions Encouraged to exercise;To provide support and resources with identified psychosocial needs;Provide feedback about the scores to participant    Expected Outcomes Short Term goal: Utilizing psychosocial counselor, staff and physician to assist with identification of specific Stressors or current issues interfering with healing process. Setting desired goal for each stressor or current issue identified.;Long Term Goal: Stressors or current issues are controlled or eliminated.;Short Term goal: Identification and review with participant of any Quality of Life or Depression concerns found by scoring the questionnaire.;Long Term goal: The participant improves quality of Life and PHQ9 Scores as seen by post scores and/or verbalization of  changes          Quality of Life Scores:   Scores of 19 and below usually indicate a poorer quality of life in these areas.  A difference of  2-3 points is a clinically meaningful difference.  A difference of 2-3 points in the total score of the Quality of Life Index has been associated with significant improvement in overall quality of life, self-image, physical symptoms, and general health in studies assessing change in quality of life.  PHQ-9: Review Flowsheet  More data exists      02/01/2024 10/28/2022 09/29/2022 07/21/2021 07/17/2020  Depression screen PHQ 2/9  Decreased Interest 0 0 0 0 0  Down, Depressed, Hopeless 0 0 0 0 0  PHQ - 2 Score 0 0 0 0 0  Altered sleeping 0 0 - - 0  Tired, decreased energy 3 0 - - 0  Change in appetite 0 0 - - 0  Feeling bad or failure about yourself  0 0 - - 0  Trouble concentrating 0 0 - - 0  Moving slowly or fidgety/restless 0 0 - - 0  Suicidal thoughts 0 0 - - 0  PHQ-9 Score 3 0  - - 0   Difficult doing work/chores Not difficult at all Not difficult at all - - Not difficult at all    Details       Data saved with a previous flowsheet row definition        Interpretation of Total Score  Total Score Depression Severity:  1-4 = Minimal depression, 5-9 = Mild depression, 10-14 = Moderate depression, 15-19 = Moderately severe depression, 20-27 = Severe depression   Psychosocial Evaluation and Intervention:  Psychosocial Evaluation - 01/25/24 1425       Psychosocial Evaluation & Interventions   Interventions Encouraged to exercise with the program and follow exercise prescription;Relaxation education;Stress management education    Comments He can look to his wfe and kids for support. He states no mental instability and takes no medication for his mood.    Expected Outcomes Short: Start HeartTracks to help with mood. Long: Maintain a healthy mental state    Continue Psychosocial Services  Follow up required by staff  Psychosocial Re-Evaluation:   Psychosocial Discharge (Final Psychosocial Re-Evaluation):   Vocational Rehabilitation: Provide vocational rehab assistance to qualifying candidates.   Vocational Rehab Evaluation & Intervention:  Vocational Rehab - 01/25/24 1423       Initial Vocational Rehab Evaluation & Intervention   Assessment shows need for Vocational Rehabilitation No          Education: Education Goals: Education classes will be provided on a variety of topics geared toward better understanding of heart health and risk factor modification. Participant will state understanding/return demonstration of topics presented as noted by education test scores.  Learning Barriers/Preferences:  Learning Barriers/Preferences - 01/25/24 1423       Learning Barriers/Preferences   Learning Barriers None    Learning Preferences None          General Cardiac Education Topics:  AED/CPR: - Group verbal and written instruction with the use of models to demonstrate the basic use of the AED with the basic ABC's of resuscitation.   Test and Procedures: - Group verbal and visual presentation and models provide information about basic cardiac anatomy and function. Reviews the testing methods done to diagnose heart disease and the outcomes of the test results. Describes the treatment choices: Medical Management, Angioplasty, or Coronary Bypass Surgery for treating various heart conditions including Myocardial Infarction, Angina, Valve Disease, and Cardiac Arrhythmias. Written material provided at class time. Flowsheet Row Cardiac Rehab from 02/02/2024 in [facility] Cardiac and Pulmonary Rehab  Date 02/02/24  Educator lc  Instruction Review Code 1- Verbalizes Understanding    Medication Safety: - Group verbal and visual instruction to review commonly prescribed medications for heart and lung disease. Reviews the medication, class of the drug, and side effects. Includes the steps to properly  store meds and maintain the prescription regimen. Written material provided at class time.   Intimacy: - Group verbal instruction through game format to discuss how heart and lung disease can affect sexual intimacy. Written material provided at class time.   Know Your Numbers and Heart Failure: - Group verbal and visual instruction to discuss disease risk factors for cardiac and pulmonary disease and treatment options.  Reviews associated critical values for Overweight/Obesity, Hypertension, Cholesterol, and Diabetes.  Discusses basics of heart failure: signs/symptoms and treatments.  Introduces Heart Failure Zone chart for action plan for heart failure. Written material provided at class time.   Infection Prevention: - Provides verbal and written material to individual with discussion of infection control including proper hand washing and proper equipment cleaning during exercise session. Flowsheet Row Cardiac Rehab from 02/02/2024 in [facility] Cardiac and Pulmonary Rehab  Date 01/25/24  Educator [facility]  Instruction Review Code 1- Verbalizes Understanding    Falls Prevention: - Provides verbal and written material to individual with discussion of falls prevention and safety. Flowsheet Row Cardiac Rehab from 02/02/2024 in [facility] Cardiac and Pulmonary Rehab  Date 01/25/24  Educator [facility]  Instruction Review Code 1- Verbalizes Understanding    Other: -Provides group and verbal instruction on various topics (see comments)   Knowledge Questionnaire Score:   Core Components/Risk Factors/Patient Goals at Admission:  Personal Goals and Risk Factors at Admission - 01/25/24 1422       Core Components/Risk Factors/Patient Goals on Admission    Weight Management Yes;Weight Loss    Intervention Weight Management: Develop a combined nutrition and exercise program designed to reach desired caloric intake, while maintaining appropriate intake of nutrient and fiber, sodium and fats, and appropriate energy  expenditure required for the weight goal.;Weight Management:  Provide education and appropriate resources to help participant work on and attain dietary goals.;Weight Management/Obesity: Establish reasonable short term and long term weight goals.;Obesity: Provide education and appropriate resources to help participant work on and attain dietary goals.    Expected Outcomes Short Term: Continue to assess and modify interventions until short term weight is achieved;Weight Maintenance: Understanding of the daily nutrition guidelines, which includes 25-35% calories from fat, 7% or less cal from saturated fats, less than 200mg  cholesterol, less than 1.5gm of sodium, & 5 or more servings of fruits and vegetables daily;Understanding recommendations for meals to include 15-35% energy as protein, 25-35% energy from fat, 35-60% energy from carbohydrates, less than 200mg  of dietary cholesterol, 20-35 gm of total fiber daily;Weight Loss: Understanding of general recommendations for a balanced deficit meal plan, which promotes 1-2 lb weight loss per week and includes a negative energy balance of [phone] kcal/d;Understanding of distribution of calorie intake throughout the day with the consumption of 4-5 meals/snacks    Hypertension Yes    Intervention Provide education on lifestyle modifcations including regular physical activity/exercise, weight management, moderate sodium restriction and increased consumption of fresh fruit, vegetables, and low fat dairy, alcohol moderation, and smoking cessation.;Monitor prescription use compliance.    Expected Outcomes Short Term: Continued assessment and intervention until BP is < 140/55mm HG in hypertensive participants. < 130/65mm HG in hypertensive participants with diabetes, heart failure or chronic kidney disease.;Long Term: Maintenance of blood pressure at goal levels.    Lipids Yes    Intervention Provide education and support for participant on nutrition & aerobic/resistive  exercise along with prescribed medications to achieve LDL 70mg , HDL >40mg .    Expected Outcomes Short Term: Participant states understanding of desired cholesterol values and is compliant with medications prescribed. Participant is following exercise prescription and nutrition guidelines.;Long Term: Cholesterol controlled with medications as prescribed, with individualized exercise RX and with personalized nutrition plan. Value goals: LDL < 70mg , HDL > 40 mg.          Education:Diabetes - Individual verbal and written instruction to review signs/symptoms of diabetes, desired ranges of glucose level fasting, after meals and with exercise. Acknowledge that pre and post exercise glucose checks will be done for 3 sessions at entry of program.   Core Components/Risk Factors/Patient Goals Review:    Core Components/Risk Factors/Patient Goals at Discharge (Final Review):    ITP Comments:  ITP Comments     Row Name 01/25/24 1427 02/01/24 1146 02/02/24 0932 02/08/24 1139     ITP Comments Virtual Visit completed. Patient informed on EP and RD appointment and 6 Minute walk test. Patient also informed of patient health questionnaires on My Chart. Patient Verbalizes understanding. Visit diagnosis can be found in [facility] 12/13/2023. Completed and gym orientation for cardiac rehab. Initial ITP created and sent for review to Dr. Oneil Pinal, Medical Director. First full day of exercise!  Patient was oriented to gym and equipment including functions, settings, policies, and procedures.  Patient's individual exercise prescription and treatment plan were reviewed.  All starting workloads were established based on the results of the 6 minute walk test done at initial orientation visit.  The plan for exercise progression was also introduced and progression will be customized based on patient's performance and goals. 30 Day review completed. Medical Director ITP review done, changes made as directed, and signed  approval by Medical Director. New to Program       Comments: 30 Day Review    [1]  Current Outpatient Medications:  amLODipine  (NORVASC ) 5 MG tablet, Take 2 tablets (10 mg total) by mouth daily. (Patient not taking: Reported on 01/25/2024), Disp: 30 tablet, Rfl: 10   Ascorbic Acid (VITAMIN C) 500 MG CAPS, Take 500 mg by mouth daily. (Patient not taking: Reported on 01/25/2024), Disp: , Rfl:    Ascorbic Acid 500 MG CAPS, Take 1,000 mg by mouth. (Patient not taking: Reported on 01/25/2024), Disp: , Rfl:    cloNIDine  (CATAPRES ) 0.1 MG tablet, Take 0.1 mg by mouth daily. (Patient not taking: Reported on 01/25/2024), Disp: , Rfl:    DEPAKOTE  250 MG DR tablet, TAKE 1 TABLET BY MOUTH TWICE DAILY, Disp: 180 tablet, Rfl: 3   ELIQUIS 5 MG TABS tablet, SMARTSIG:1 Tablet(s) By Mouth Every 12 Hours, Disp: , Rfl:    Evolocumab (REPATHA) 140 MG/ML SOSY, Inject 140 mg into the skin every 14 (fourteen) days., Disp: , Rfl:    furosemide  (LASIX ) 20 MG tablet, Take 20 mg by mouth. (Patient not taking: Reported on 01/25/2024), Disp: , Rfl:    furosemide  (LASIX ) 40 MG tablet, Take 40 mg by mouth 2 (two) times daily. (Patient not taking: Reported on 01/25/2024), Disp: , Rfl:    Garlic 500 MG TABS, Take 500 mg by mouth daily. (Patient not taking: Reported on 01/25/2024), Disp: , Rfl:    hydrALAZINE (APRESOLINE) 25 MG tablet, Take 25 mg by mouth 2 (two) times daily., Disp: , Rfl:    hydrochlorothiazide  (HYDRODIURIL ) 25 MG tablet, Take 25 mg by mouth daily., Disp: , Rfl:    hydrochlorothiazide  (MICROZIDE ) 12.5 MG capsule, Take 12.5 mg by mouth daily. (Patient not taking: Reported on 01/25/2024), Disp: , Rfl:    losartan (COZAAR) 50 MG tablet, Take 50 mg by mouth daily., Disp: , Rfl:    methocarbamol  (ROBAXIN ) 500 MG tablet, Take 1 tablet (500 mg total) by mouth every 6 (six) hours as needed for muscle spasms. (Patient not taking: Reported on 01/25/2024), Disp: 40 tablet, Rfl: 2   metoprolol tartrate (LOPRESSOR) 25  MG tablet, Take 25 mg by mouth daily., Disp: , Rfl:    Multiple Vitamin (MULTIVITAMIN) capsule, Take 1 capsule by mouth daily.  (Patient not taking: Reported on 01/25/2024), Disp: , Rfl:    NITROSTAT  0.4 MG SL tablet, DISSOLVE 1 TABLET UNDER THE TONGUE FOR CHEST PAIN. MAY REPEAT EVERY UP TO 3 DOSES. IF NO RELIEF, CALL 911** (Patient not taking: Reported on 01/25/2024), Disp: 25 tablet, Rfl: 2   omeprazole (PRILOSEC) 40 MG capsule, Take 40 mg by mouth., Disp: , Rfl:    oxyCODONE  (OXY IR/ROXICODONE ) 5 MG immediate release tablet, Take 1 tablet (5 mg total) by mouth every 4 (four) hours as needed for severe pain (pain score 7-10). (Patient not taking: Reported on 01/25/2024), Disp: 42 tablet, Rfl: 0   polyethylene glycol powder (GLYCOLAX /MIRALAX ) 17 GM/SCOOP powder, Take 17 g mixed in liquid by mouth 2 (two) times daily. (Patient not taking: Reported on 01/25/2024), Disp: 238 g, Rfl: 0   potassium chloride SA (KLOR-CON M) 20 MEQ tablet, Take 20 mEq by mouth 2 (two) times daily., Disp: , Rfl:    REPATHA SURECLICK 140 MG/ML SOAJ, SMARTSIG:140 Milligram(s) SUB-Q Every 2 Weeks, Disp: , Rfl:    tirzepatide (ZEPBOUND) 2.5 MG/0.5ML injection vial, Inject 2.5 mg into the skin. (Patient not taking: Reported on 01/25/2024), Disp: , Rfl:    valsartan (DIOVAN) 320 MG tablet, Take 320 mg by mouth daily. (Patient not taking: Reported on 01/25/2024), Disp: , Rfl:  [2]  Social History Tobacco Use  Smoking Status Never  Smokeless Tobacco Never

## 2024-02-09 ENCOUNTER — Encounter: Admitting: Emergency Medicine

## 2024-02-09 DIAGNOSIS — Z48812 Encounter for surgical aftercare following surgery on the circulatory system: Secondary | ICD-10-CM | POA: Diagnosis not present

## 2024-02-09 DIAGNOSIS — Z951 Presence of aortocoronary bypass graft: Secondary | ICD-10-CM

## 2024-02-09 NOTE — Progress Notes (Signed)
 Daily Session Note  Patient Details  Name: Joshua Stark MRN: [id number] Date of Birth: 1945-12-07 Referring Provider:   Flowsheet Row Cardiac Rehab from 02/01/2024 in [facility] Cardiac and Pulmonary Rehab  Referring Provider Dr. Debby Motto, MD    Encounter Date: 02/09/2024  Check In:  Session Check In - 02/09/24 0927       Check-In   Supervising physician immediately available to respond to emergencies See telemetry face sheet for immediately available ER MD    Location [facility]-Cardiac & Pulmonary Rehab    Staff Present Leita Franks RN,BSN;Joseph [facility] BS, Exercise Physiologist;Noah Tickle, BS, Exercise Physiologist    Virtual Visit No    Medication changes reported     No    Fall or balance concerns reported    No    Warm-up and Cool-down Performed on first and last piece of equipment    Resistance Training Performed Yes    VAD Patient? No    PAD/SET Patient? No      Pain Assessment   Currently in Pain? No/denies             Tobacco Use History[1]  Goals Met:  Independence with exercise equipment Exercise tolerated well No report of concerns or symptoms today Strength training completed today  Goals Unmet:  Not Applicable  Comments: Pt able to follow exercise prescription today without complaint.  Will continue to monitor for progression.    Dr. Oneil Pinal is Medical Director for [facility] Cardiac Rehabilitation.  Dr. Fuad Aleskerov is Medical Director for [facility] Pulmonary Rehabilitation.    [1]  Social History Tobacco Use  Smoking Status Never  Smokeless Tobacco Never

## 2024-02-14 ENCOUNTER — Encounter

## 2024-02-14 DIAGNOSIS — Z48812 Encounter for surgical aftercare following surgery on the circulatory system: Secondary | ICD-10-CM | POA: Diagnosis not present

## 2024-02-14 DIAGNOSIS — Z951 Presence of aortocoronary bypass graft: Secondary | ICD-10-CM

## 2024-02-14 NOTE — Progress Notes (Signed)
 Daily Session Note  Patient Details  Name: Joshua Stark MRN: [id number] Date of Birth: [birth year] Referring Provider:   Flowsheet Row Cardiac Rehab from 02/01/2024 in [facility] Cardiac and Pulmonary Rehab  Referring Provider Dr. Debby Motto, MD    Encounter Date: 02/14/2024  Check In:  Session Check In - 02/14/24 0917       Check-In   Supervising physician immediately available to respond to emergencies See telemetry face sheet for immediately available ER MD    Location [facility]-Cardiac & Pulmonary Rehab    Staff Present Burnard Davenport RN,BSN,MPA;Maxon Conetta BS, Exercise Physiologist;Margaret Best, MS, Exercise Physiologist;Jason Elnor RDN,LDN    Virtual Visit No    Medication changes reported     No    Fall or balance concerns reported    No    Warm-up and Cool-down Performed on first and last piece of equipment    Resistance Training Performed Yes    VAD Patient? No    PAD/SET Patient? No      Pain Assessment   Currently in Pain? No/denies             Tobacco Use History[1]  Goals Met:  Independence with exercise equipment Exercise tolerated well No report of concerns or symptoms today Strength training completed today  Goals Unmet:  Not Applicable  Comments: Pt able to follow exercise prescription today without complaint.  Will continue to monitor for progression.    Dr. Oneil Pinal is Medical Director for [facility] Cardiac Rehabilitation.  Dr. Fuad Aleskerov is Medical Director for [facility] Pulmonary Rehabilitation.    [1]  Social History Tobacco Use  Smoking Status Never  Smokeless Tobacco Never

## 2024-02-16 ENCOUNTER — Encounter: Admitting: Emergency Medicine

## 2024-02-16 DIAGNOSIS — Z951 Presence of aortocoronary bypass graft: Secondary | ICD-10-CM

## 2024-02-16 DIAGNOSIS — Z48812 Encounter for surgical aftercare following surgery on the circulatory system: Secondary | ICD-10-CM | POA: Diagnosis not present

## 2024-02-16 NOTE — Progress Notes (Signed)
 Daily Session Note  Patient Details  Name: Joshua Stark MRN: [id number] Date of Birth: Aug 22, 1945 Referring Provider:   Flowsheet Row Cardiac Rehab from 02/01/2024 in [facility] Cardiac and Pulmonary Rehab  Referring Provider Dr. Debby Motto, MD    Encounter Date: 02/16/2024  Check In:  Session Check In - 02/16/24 0927       Check-In   Supervising physician immediately available to respond to emergencies See telemetry face sheet for immediately available ER MD    Location [facility]-Cardiac & Pulmonary Rehab    Staff Present Leita Franks RN,BSN;Joseph [facility] BS, Exercise Physiologist;Noah Tickle, BS, Exercise Physiologist    Virtual Visit No    Medication changes reported     No    Fall or balance concerns reported    No    Warm-up and Cool-down Performed on first and last piece of equipment    Resistance Training Performed Yes    VAD Patient? No    PAD/SET Patient? No      Pain Assessment   Currently in Pain? No/denies             Tobacco Use History[1]  Goals Met:  Independence with exercise equipment Exercise tolerated well No report of concerns or symptoms today Strength training completed today  Goals Unmet:  Not Applicable  Comments: Pt able to follow exercise prescription today without complaint.  Will continue to monitor for progression.    Dr. Oneil Pinal is Medical Director for [facility] Cardiac Rehabilitation.  Dr. Fuad Aleskerov is Medical Director for [facility] Pulmonary Rehabilitation.    [1]  Social History Tobacco Use  Smoking Status Never  Smokeless Tobacco Never

## 2024-02-21 ENCOUNTER — Encounter: Admitting: Emergency Medicine

## 2024-02-21 DIAGNOSIS — Z48812 Encounter for surgical aftercare following surgery on the circulatory system: Secondary | ICD-10-CM | POA: Diagnosis not present

## 2024-02-21 DIAGNOSIS — Z951 Presence of aortocoronary bypass graft: Secondary | ICD-10-CM

## 2024-02-21 NOTE — Progress Notes (Signed)
 Daily Session Note  Patient Details  Name: Joshua Stark MRN: [id number] Date of Birth: 1945/12/08 Referring Provider:   Flowsheet Row Cardiac Rehab from 02/01/2024 in [facility] Cardiac and Pulmonary Rehab  Referring Provider Dr. Debby Motto, MD    Encounter Date: 02/21/2024  Check In:  Session Check In - 02/21/24 0941       Check-In   Supervising physician immediately available to respond to emergencies See telemetry face sheet for immediately available ER MD    Location [facility]-Cardiac & Pulmonary Rehab    Staff Present Leita Franks RN,BSN;Maxon Burnell BS, Exercise Physiologist;Margaret Best, MS, Exercise Physiologist;Noah Tickle, BS, Exercise Physiologist    Virtual Visit No    Medication changes reported     No    Fall or balance concerns reported    No    Warm-up and Cool-down Performed on first and last piece of equipment    Resistance Training Performed Yes    VAD Patient? No    PAD/SET Patient? No      Pain Assessment   Currently in Pain? No/denies             Tobacco Use History[1]  Goals Met:  Independence with exercise equipment Exercise tolerated well No report of concerns or symptoms today Strength training completed today  Goals Unmet:  Not Applicable  Comments: Pt able to follow exercise prescription today without complaint.  Will continue to monitor for progression.    Dr. Oneil Pinal is Medical Director for [facility] Cardiac Rehabilitation.  Dr. Fuad Aleskerov is Medical Director for [facility] Pulmonary Rehabilitation.    [1]  Social History Tobacco Use  Smoking Status Never  Smokeless Tobacco Never

## 2024-02-23 ENCOUNTER — Encounter: Admitting: Emergency Medicine

## 2024-02-23 DIAGNOSIS — Z48812 Encounter for surgical aftercare following surgery on the circulatory system: Secondary | ICD-10-CM | POA: Diagnosis not present

## 2024-02-23 DIAGNOSIS — Z951 Presence of aortocoronary bypass graft: Secondary | ICD-10-CM

## 2024-02-23 NOTE — Progress Notes (Signed)
 Daily Session Note  Patient Details  Name: Joshua Stark MRN: [id number] Date of Birth: 02-Apr-1945 Referring Provider:   Flowsheet Row Cardiac Rehab from 02/01/2024 in [facility] Cardiac and Pulmonary Rehab  Referring Provider Dr. Debby Motto, MD    Encounter Date: 02/23/2024  Check In:  Session Check In - 02/23/24 0943       Check-In   Supervising physician immediately available to respond to emergencies See telemetry face sheet for immediately available ER MD    Location [facility]-Cardiac & Pulmonary Rehab    Staff Present Leita Franks RN,BSN;Maxon Conetta BS, Exercise Physiologist;Margaret Best, MS, Exercise Physiologist;Jason Elnor RDN,LDN    Virtual Visit No    Medication changes reported     No    Fall or balance concerns reported    No    Warm-up and Cool-down Performed on first and last piece of equipment    Resistance Training Performed Yes    VAD Patient? No    PAD/SET Patient? No      Pain Assessment   Currently in Pain? No/denies             Tobacco Use History[1]  Goals Met:  Independence with exercise equipment Exercise tolerated well No report of concerns or symptoms today Strength training completed today  Goals Unmet:  Not Applicable  Comments: Pt able to follow exercise prescription today without complaint.  Will continue to monitor for progression.    Dr. Oneil Pinal is Medical Director for [facility] Cardiac Rehabilitation.  Dr. Fuad Aleskerov is Medical Director for [facility] Pulmonary Rehabilitation.    [1]  Social History Tobacco Use  Smoking Status Never  Smokeless Tobacco Never

## 2024-02-28 ENCOUNTER — Encounter: Attending: Cardiology

## 2024-02-28 DIAGNOSIS — Z951 Presence of aortocoronary bypass graft: Secondary | ICD-10-CM

## 2024-02-28 NOTE — Progress Notes (Signed)
 Daily Session Note  Patient Details  Name: Joshua Stark MRN: [id number] Date of Birth: 01-Jan-1946 Referring Provider:   Flowsheet Row Cardiac Rehab from 02/01/2024 in [facility] Cardiac and Pulmonary Rehab  Referring Provider Dr. Debby Motto, MD    Encounter Date: 02/28/2024  Check In:  Session Check In - 02/28/24 0907       Check-In   Supervising physician immediately available to respond to emergencies See telemetry face sheet for immediately available ER MD    Location [facility]-Cardiac & Pulmonary Rehab    Staff Present Burnard Davenport RN,BSN,MPA;Margaret Best, MS, Exercise Physiologist;Noah Tickle, BS, Exercise Physiologist;Maxon Conetta BS, Exercise Physiologist    Virtual Visit No    Medication changes reported     No    Fall or balance concerns reported    No    Warm-up and Cool-down Performed on first and last piece of equipment    Resistance Training Performed Yes    VAD Patient? No    PAD/SET Patient? No      Pain Assessment   Currently in Pain? No/denies             Tobacco Use History[1]  Goals Met:  Independence with exercise equipment Exercise tolerated well No report of concerns or symptoms today Strength training completed today  Goals Unmet:  Not Applicable  Comments: Pt able to follow exercise prescription today without complaint.  Will continue to monitor for progression.    Dr. Oneil Pinal is Medical Director for [facility] Cardiac Rehabilitation.  Dr. Fuad Aleskerov is Medical Director for [facility] Pulmonary Rehabilitation.    [1]  Social History Tobacco Use  Smoking Status Never  Smokeless Tobacco Never

## 2024-03-01 ENCOUNTER — Encounter: Admitting: Emergency Medicine

## 2024-03-01 DIAGNOSIS — Z951 Presence of aortocoronary bypass graft: Secondary | ICD-10-CM

## 2024-03-01 NOTE — Progress Notes (Signed)
 Daily Session Note  Patient Details  Name: Joshua Stark MRN: [id number] Date of Birth: 1945/07/19 Referring Provider:   Flowsheet Row Cardiac Rehab from 02/01/2024 in [facility] Cardiac and Pulmonary Rehab  Referring Provider Dr. Debby Motto, MD    Encounter Date: 03/01/2024  Check In:  Session Check In - 03/01/24 0929       Check-In   Supervising physician immediately available to respond to emergencies See telemetry face sheet for immediately available ER MD    Location [facility]-Cardiac & Pulmonary Rehab    Staff Present Leita Franks RN,BSN;Joseph [facility] BS, Exercise Physiologist;Noah Tickle, BS, Exercise Physiologist    Virtual Visit No    Medication changes reported     No    Fall or balance concerns reported    No    Warm-up and Cool-down Performed on first and last piece of equipment    Resistance Training Performed Yes    VAD Patient? No    PAD/SET Patient? No      Pain Assessment   Currently in Pain? No/denies             Tobacco Use History[1]  Goals Met:  Independence with exercise equipment Exercise tolerated well No report of concerns or symptoms today Strength training completed today  Goals Unmet:  Not Applicable  Comments: Pt able to follow exercise prescription today without complaint.  Will continue to monitor for progression.    Dr. Oneil Pinal is Medical Director for [facility] Cardiac Rehabilitation.  Dr. Fuad Aleskerov is Medical Director for [facility] Pulmonary Rehabilitation.    [1]  Social History Tobacco Use  Smoking Status Never  Smokeless Tobacco Never

## 2024-03-02 ENCOUNTER — Other Ambulatory Visit (HOSPITAL_COMMUNITY): Payer: Self-pay

## 2024-03-02 ENCOUNTER — Telehealth: Payer: Self-pay

## 2024-03-02 NOTE — Telephone Encounter (Signed)
 Noted.

## 2024-03-02 NOTE — Telephone Encounter (Signed)
 Pharmacy Patient Advocate Encounter   Received notification from [facility] that prior authorization for Depakote  25 DR tabs is required/requested.   Insurance verification completed.   The patient is insured through [employer].   Per test claim: The current 90 day co-pay is, $15.00.  No PA needed at this time. This test claim was processed through [facility]- copay amounts may vary at other pharmacies due to pharmacy/plan contracts, or as the patient moves through the different stages of their insurance plan.

## 2024-03-06 ENCOUNTER — Encounter

## 2024-03-08 ENCOUNTER — Encounter

## 2024-03-13 ENCOUNTER — Encounter

## 2024-03-15 ENCOUNTER — Encounter

## 2024-03-20 ENCOUNTER — Encounter

## 2024-03-22 ENCOUNTER — Encounter

## 2024-03-27 ENCOUNTER — Encounter: Attending: Cardiology

## 2024-03-29 ENCOUNTER — Encounter

## 2024-04-03 ENCOUNTER — Encounter

## 2024-04-05 ENCOUNTER — Encounter

## 2024-04-10 ENCOUNTER — Encounter

## 2024-04-12 ENCOUNTER — Encounter

## 2024-04-17 ENCOUNTER — Encounter

## 2024-04-19 ENCOUNTER — Encounter

## 2024-04-24 ENCOUNTER — Encounter

## 2024-04-26 ENCOUNTER — Encounter: Attending: Cardiology

## 2024-05-01 ENCOUNTER — Encounter

## 2024-05-03 ENCOUNTER — Encounter

## 2024-05-08 ENCOUNTER — Encounter

## 2024-05-10 ENCOUNTER — Encounter

## 2024-05-15 ENCOUNTER — Encounter

## 2024-05-17 ENCOUNTER — Encounter

## 2024-05-22 ENCOUNTER — Encounter

## 2024-05-24 ENCOUNTER — Encounter

## 2024-05-29 ENCOUNTER — Encounter: Attending: Cardiology

## 2024-05-31 ENCOUNTER — Encounter
